# Patient Record
Sex: Female | Born: 1950
Health system: Southern US, Community
[De-identification: ages and names within clinical notes are randomized; demographics above are authoritative.]

## PROBLEM LIST (undated history)

## (undated) DIAGNOSIS — T7840XA Allergy, unspecified, initial encounter: Secondary | ICD-10-CM

## (undated) DIAGNOSIS — F419 Anxiety disorder, unspecified: Secondary | ICD-10-CM

## (undated) DIAGNOSIS — I10 Essential (primary) hypertension: Secondary | ICD-10-CM

## (undated) DIAGNOSIS — H539 Unspecified visual disturbance: Secondary | ICD-10-CM

## (undated) DIAGNOSIS — E785 Hyperlipidemia, unspecified: Secondary | ICD-10-CM

## (undated) DIAGNOSIS — M1990 Unspecified osteoarthritis, unspecified site: Secondary | ICD-10-CM

## (undated) HISTORY — DX: Essential (primary) hypertension: I10

## (undated) HISTORY — DX: Hyperlipidemia, unspecified: E78.5

## (undated) HISTORY — DX: Anxiety disorder, unspecified: F41.9

## (undated) HISTORY — PX: OTHER SURGICAL HISTORY: SHX169

## (undated) HISTORY — DX: Unspecified osteoarthritis, unspecified site: M19.90

## (undated) HISTORY — DX: Allergy, unspecified, initial encounter: T78.40XA

## (undated) HISTORY — PX: TOTAL ABDOMINAL HYSTERECTOMY: SHX209

---

## 2002-01-28 ENCOUNTER — Encounter: Payer: Self-pay | Admitting: Family Medicine

## 2002-01-28 ENCOUNTER — Ambulatory Visit (HOSPITAL_COMMUNITY): Admission: RE | Admit: 2002-01-28 | Discharge: 2002-01-28 | Payer: Self-pay | Admitting: Family Medicine

## 2002-02-19 ENCOUNTER — Other Ambulatory Visit: Admission: RE | Admit: 2002-02-19 | Discharge: 2002-02-19 | Payer: Self-pay | Admitting: Family Medicine

## 2003-04-12 ENCOUNTER — Encounter: Payer: Self-pay | Admitting: Family Medicine

## 2003-04-12 ENCOUNTER — Ambulatory Visit (HOSPITAL_COMMUNITY): Admission: RE | Admit: 2003-04-12 | Discharge: 2003-04-12 | Payer: Self-pay | Admitting: Family Medicine

## 2003-10-26 ENCOUNTER — Emergency Department (HOSPITAL_COMMUNITY): Admission: EM | Admit: 2003-10-26 | Discharge: 2003-10-26 | Payer: Self-pay | Admitting: Emergency Medicine

## 2004-05-01 ENCOUNTER — Ambulatory Visit (HOSPITAL_COMMUNITY): Admission: RE | Admit: 2004-05-01 | Discharge: 2004-05-01 | Payer: Self-pay | Admitting: Family Medicine

## 2004-11-19 ENCOUNTER — Ambulatory Visit: Payer: Self-pay | Admitting: Family Medicine

## 2004-11-23 ENCOUNTER — Ambulatory Visit (HOSPITAL_COMMUNITY): Admission: RE | Admit: 2004-11-23 | Discharge: 2004-11-23 | Payer: Self-pay | Admitting: Family Medicine

## 2005-02-18 ENCOUNTER — Ambulatory Visit: Payer: Self-pay | Admitting: Family Medicine

## 2005-04-01 ENCOUNTER — Ambulatory Visit: Payer: Self-pay | Admitting: Family Medicine

## 2005-05-02 ENCOUNTER — Ambulatory Visit (HOSPITAL_COMMUNITY): Admission: RE | Admit: 2005-05-02 | Discharge: 2005-05-02 | Payer: Self-pay | Admitting: Family Medicine

## 2005-05-13 ENCOUNTER — Ambulatory Visit: Payer: Self-pay | Admitting: Family Medicine

## 2005-07-24 ENCOUNTER — Ambulatory Visit: Payer: Self-pay | Admitting: Family Medicine

## 2005-09-23 ENCOUNTER — Ambulatory Visit: Payer: Self-pay | Admitting: Family Medicine

## 2006-01-24 ENCOUNTER — Ambulatory Visit: Payer: Self-pay | Admitting: Family Medicine

## 2006-05-13 ENCOUNTER — Ambulatory Visit (HOSPITAL_COMMUNITY): Admission: RE | Admit: 2006-05-13 | Discharge: 2006-05-13 | Payer: Self-pay | Admitting: Family Medicine

## 2006-07-15 ENCOUNTER — Ambulatory Visit: Payer: Self-pay | Admitting: Family Medicine

## 2006-10-16 ENCOUNTER — Ambulatory Visit: Payer: Self-pay | Admitting: Family Medicine

## 2006-11-13 ENCOUNTER — Ambulatory Visit: Payer: Self-pay | Admitting: Family Medicine

## 2007-01-02 ENCOUNTER — Encounter: Payer: Self-pay | Admitting: Family Medicine

## 2007-01-02 LAB — CONVERTED CEMR LAB
BUN: 17 mg/dL (ref 6–23)
Basophils Absolute: 0.1 10*3/uL (ref 0.0–0.1)
Basophils Relative: 2 % — ABNORMAL HIGH (ref 0–1)
CO2: 25 meq/L (ref 19–32)
Calcium: 9.5 mg/dL (ref 8.4–10.5)
Chloride: 107 meq/L (ref 96–112)
Cholesterol: 133 mg/dL (ref 0–200)
Creatinine, Ser: 0.6 mg/dL (ref 0.40–1.20)
Eosinophils Absolute: 0.1 10*3/uL (ref 0.0–0.7)
Eosinophils Relative: 3 % (ref 0–5)
Glucose, Bld: 78 mg/dL (ref 70–99)
HCT: 38.6 % (ref 36.0–46.0)
HDL: 55 mg/dL (ref >39)
Hemoglobin: 11.9 g/dL — ABNORMAL LOW (ref 12.0–15.0)
LDL Cholesterol: 62 mg/dL (ref 0–99)
Lymphocytes Relative: 40 % (ref 12–46)
Lymphs Abs: 1.8 10*3/uL (ref 0.7–3.3)
MCHC: 30.8 g/dL (ref 30.0–36.0)
MCV: 100.8 fL — ABNORMAL HIGH (ref 78.0–100.0)
Monocytes Absolute: 0.4 10*3/uL (ref 0.2–0.7)
Monocytes Relative: 8 % (ref 3–11)
Neutro Abs: 2.1 10*3/uL (ref 1.7–7.7)
Neutrophils Relative %: 47 % (ref 43–77)
Platelets: 222 10*3/uL (ref 150–400)
Potassium: 4.3 meq/L (ref 3.5–5.3)
RBC: 3.83 M/uL — ABNORMAL LOW (ref 3.87–5.11)
RDW: 14.2 % — ABNORMAL HIGH (ref 11.5–14.0)
Sodium: 143 meq/L (ref 135–145)
TSH: 2.674 microintl units/mL (ref 0.350–5.50)
Total CHOL/HDL Ratio: 2.4
Triglycerides: 80 mg/dL (ref <150)
VLDL: 16 mg/dL (ref 0–40)
WBC: 4.4 10*3/uL (ref 4.0–10.5)

## 2007-01-08 ENCOUNTER — Other Ambulatory Visit: Admission: RE | Admit: 2007-01-08 | Discharge: 2007-01-08 | Payer: Self-pay | Admitting: Family Medicine

## 2007-01-08 ENCOUNTER — Encounter: Payer: Self-pay | Admitting: Family Medicine

## 2007-01-08 ENCOUNTER — Ambulatory Visit: Payer: Self-pay | Admitting: Family Medicine

## 2007-01-12 ENCOUNTER — Encounter: Payer: Self-pay | Admitting: Family Medicine

## 2007-01-12 LAB — CONVERTED CEMR LAB: Pap Smear: NORMAL

## 2007-02-20 ENCOUNTER — Encounter: Admission: RE | Admit: 2007-02-20 | Discharge: 2007-02-20 | Payer: Self-pay | Admitting: Neurosurgery

## 2007-03-06 ENCOUNTER — Encounter: Admission: RE | Admit: 2007-03-06 | Discharge: 2007-03-06 | Payer: Self-pay | Admitting: Neurosurgery

## 2007-05-18 ENCOUNTER — Ambulatory Visit (HOSPITAL_COMMUNITY): Admission: RE | Admit: 2007-05-18 | Discharge: 2007-05-18 | Payer: Self-pay | Admitting: Family Medicine

## 2007-05-21 ENCOUNTER — Ambulatory Visit: Payer: Self-pay | Admitting: Family Medicine

## 2007-05-28 ENCOUNTER — Ambulatory Visit (HOSPITAL_COMMUNITY): Admission: RE | Admit: 2007-05-28 | Discharge: 2007-05-28 | Payer: Self-pay | Admitting: Family Medicine

## 2007-07-07 ENCOUNTER — Encounter: Payer: Self-pay | Admitting: Family Medicine

## 2007-07-07 LAB — CONVERTED CEMR LAB
ALT: 32 units/L (ref 0–35)
AST: 25 units/L (ref 0–37)
Albumin: 3.9 g/dL (ref 3.5–5.2)
Alkaline Phosphatase: 58 units/L (ref 39–117)
BUN: 15 mg/dL (ref 6–23)
Bilirubin, Direct: 0.1 mg/dL (ref 0.0–0.3)
CO2: 22 meq/L (ref 19–32)
Calcium: 9.6 mg/dL (ref 8.4–10.5)
Chloride: 109 meq/L (ref 96–112)
Cholesterol: 135 mg/dL (ref 0–200)
Creatinine, Ser: 0.68 mg/dL (ref 0.40–1.20)
Glucose, Bld: 87 mg/dL (ref 70–99)
HDL: 52 mg/dL (ref >39)
Indirect Bilirubin: 0.3 mg/dL (ref 0.0–0.9)
LDL Cholesterol: 68 mg/dL (ref 0–99)
Potassium: 4.6 meq/L (ref 3.5–5.3)
Sodium: 144 meq/L (ref 135–145)
Total Bilirubin: 0.4 mg/dL (ref 0.3–1.2)
Total CHOL/HDL Ratio: 2.6
Total Protein: 7.1 g/dL (ref 6.0–8.3)
Triglycerides: 77 mg/dL (ref <150)
VLDL: 15 mg/dL (ref 0–40)

## 2007-09-23 ENCOUNTER — Ambulatory Visit: Payer: Self-pay | Admitting: Family Medicine

## 2007-12-11 ENCOUNTER — Ambulatory Visit: Payer: Self-pay | Admitting: Family Medicine

## 2007-12-11 LAB — CONVERTED CEMR LAB
ALT: 46 units/L — ABNORMAL HIGH (ref 0–35)
AST: 27 units/L (ref 0–37)
Albumin: 4.5 g/dL (ref 3.5–5.2)
Alkaline Phosphatase: 76 units/L (ref 39–117)
BUN: 16 mg/dL (ref 6–23)
Bilirubin, Direct: 0.1 mg/dL (ref 0.0–0.3)
CO2: 22 meq/L (ref 19–32)
Calcium: 10 mg/dL (ref 8.4–10.5)
Chloride: 108 meq/L (ref 96–112)
Cholesterol: 191 mg/dL (ref 0–200)
Creatinine, Ser: 0.58 mg/dL (ref 0.40–1.20)
Glucose, Bld: 86 mg/dL (ref 70–99)
HDL: 76 mg/dL (ref >39)
Indirect Bilirubin: 0.3 mg/dL (ref 0.0–0.9)
LDL Cholesterol: 93 mg/dL (ref 0–99)
Potassium: 4 meq/L (ref 3.5–5.3)
Sodium: 145 meq/L (ref 135–145)
Total Bilirubin: 0.4 mg/dL (ref 0.3–1.2)
Total CHOL/HDL Ratio: 2.5
Total Protein: 8.4 g/dL — ABNORMAL HIGH (ref 6.0–8.3)
Triglycerides: 109 mg/dL (ref <150)
VLDL: 22 mg/dL (ref 0–40)

## 2007-12-24 ENCOUNTER — Ambulatory Visit: Payer: Self-pay | Admitting: Family Medicine

## 2007-12-24 ENCOUNTER — Ambulatory Visit (HOSPITAL_COMMUNITY): Admission: RE | Admit: 2007-12-24 | Discharge: 2007-12-24 | Payer: Self-pay | Admitting: Family Medicine

## 2007-12-25 ENCOUNTER — Ambulatory Visit (HOSPITAL_COMMUNITY): Admission: RE | Admit: 2007-12-25 | Discharge: 2007-12-25 | Payer: Self-pay | Admitting: Family Medicine

## 2008-01-28 ENCOUNTER — Encounter: Payer: Self-pay | Admitting: Family Medicine

## 2008-01-28 LAB — CONVERTED CEMR LAB
ALT: 39 units/L — ABNORMAL HIGH (ref 0–35)
AST: 26 units/L (ref 0–37)
Albumin: 4.1 g/dL (ref 3.5–5.2)
Alkaline Phosphatase: 67 units/L (ref 39–117)
Bilirubin, Direct: <0.1 mg/dL (ref 0.0–0.3)
Total Bilirubin: 0.4 mg/dL (ref 0.3–1.2)
Total Protein: 7.4 g/dL (ref 6.0–8.3)

## 2008-02-03 ENCOUNTER — Ambulatory Visit: Payer: Self-pay | Admitting: Family Medicine

## 2008-02-03 ENCOUNTER — Other Ambulatory Visit: Admission: RE | Admit: 2008-02-03 | Discharge: 2008-02-03 | Payer: Self-pay | Admitting: Family Medicine

## 2008-02-03 LAB — CONVERTED CEMR LAB: Pap Smear: NORMAL

## 2008-02-18 ENCOUNTER — Encounter: Payer: Self-pay | Admitting: Family Medicine

## 2008-02-18 DIAGNOSIS — E669 Obesity, unspecified: Secondary | ICD-10-CM | POA: Insufficient documentation

## 2008-02-18 DIAGNOSIS — M069 Rheumatoid arthritis, unspecified: Secondary | ICD-10-CM | POA: Insufficient documentation

## 2008-02-18 DIAGNOSIS — E663 Overweight: Secondary | ICD-10-CM | POA: Insufficient documentation

## 2008-02-18 DIAGNOSIS — E782 Mixed hyperlipidemia: Secondary | ICD-10-CM | POA: Insufficient documentation

## 2008-02-18 DIAGNOSIS — I10 Essential (primary) hypertension: Secondary | ICD-10-CM | POA: Insufficient documentation

## 2008-02-18 DIAGNOSIS — R42 Dizziness and giddiness: Secondary | ICD-10-CM | POA: Insufficient documentation

## 2008-02-18 DIAGNOSIS — E785 Hyperlipidemia, unspecified: Secondary | ICD-10-CM | POA: Insufficient documentation

## 2008-05-03 ENCOUNTER — Encounter: Payer: Self-pay | Admitting: Family Medicine

## 2008-05-03 LAB — CONVERTED CEMR LAB
ALT: 32 units/L (ref 0–35)
AST: 25 units/L (ref 0–37)
Albumin: 4 g/dL (ref 3.5–5.2)
Alkaline Phosphatase: 65 units/L (ref 39–117)
Bilirubin, Direct: 0.1 mg/dL (ref 0.0–0.3)
Cholesterol: 162 mg/dL (ref 0–200)
HDL: 49 mg/dL (ref >39)
Indirect Bilirubin: 0.2 mg/dL (ref 0.0–0.9)
LDL Cholesterol: 98 mg/dL (ref 0–99)
Total Bilirubin: 0.3 mg/dL (ref 0.3–1.2)
Total CHOL/HDL Ratio: 3.3
Total Protein: 7.7 g/dL (ref 6.0–8.3)
Triglycerides: 76 mg/dL (ref <150)
VLDL: 15 mg/dL (ref 0–40)

## 2008-05-12 ENCOUNTER — Ambulatory Visit: Payer: Self-pay | Admitting: Family Medicine

## 2008-05-20 ENCOUNTER — Ambulatory Visit (HOSPITAL_COMMUNITY): Admission: RE | Admit: 2008-05-20 | Discharge: 2008-05-20 | Payer: Self-pay | Admitting: Family Medicine

## 2008-07-12 ENCOUNTER — Encounter: Payer: Self-pay | Admitting: Family Medicine

## 2008-07-12 ENCOUNTER — Ambulatory Visit: Payer: Self-pay | Admitting: Family Medicine

## 2008-07-13 DIAGNOSIS — M549 Dorsalgia, unspecified: Secondary | ICD-10-CM | POA: Insufficient documentation

## 2008-07-13 DIAGNOSIS — H906 Mixed conductive and sensorineural hearing loss, bilateral: Secondary | ICD-10-CM | POA: Insufficient documentation

## 2008-07-18 ENCOUNTER — Encounter: Payer: Self-pay | Admitting: Family Medicine

## 2008-08-09 ENCOUNTER — Encounter: Payer: Self-pay | Admitting: Family Medicine

## 2008-08-30 ENCOUNTER — Encounter: Payer: Self-pay | Admitting: Family Medicine

## 2008-09-12 ENCOUNTER — Ambulatory Visit: Payer: Self-pay | Admitting: Family Medicine

## 2008-09-12 DIAGNOSIS — M25559 Pain in unspecified hip: Secondary | ICD-10-CM | POA: Insufficient documentation

## 2008-09-21 ENCOUNTER — Ambulatory Visit (HOSPITAL_COMMUNITY): Admission: RE | Admit: 2008-09-21 | Discharge: 2008-09-21 | Payer: Self-pay | Admitting: Family Medicine

## 2008-10-19 ENCOUNTER — Encounter: Payer: Self-pay | Admitting: Family Medicine

## 2008-11-01 ENCOUNTER — Encounter: Payer: Self-pay | Admitting: Family Medicine

## 2008-11-07 ENCOUNTER — Telehealth: Payer: Self-pay | Admitting: Family Medicine

## 2008-11-15 ENCOUNTER — Ambulatory Visit: Payer: Self-pay | Admitting: Family Medicine

## 2008-11-25 ENCOUNTER — Encounter: Payer: Self-pay | Admitting: Family Medicine

## 2008-12-05 ENCOUNTER — Encounter: Payer: Self-pay | Admitting: Family Medicine

## 2008-12-28 ENCOUNTER — Encounter: Payer: Self-pay | Admitting: Family Medicine

## 2008-12-28 LAB — CONVERTED CEMR LAB
ALT: 47 units/L — ABNORMAL HIGH (ref 0–35)
AST: 30 units/L (ref 0–37)
Albumin: 4.1 g/dL (ref 3.5–5.2)
Alkaline Phosphatase: 64 units/L (ref 39–117)
BUN: 23 mg/dL (ref 6–23)
Basophils Absolute: 0 10*3/uL (ref 0.0–0.1)
Basophils Relative: 1 % (ref 0–1)
Bilirubin, Direct: 0.1 mg/dL (ref 0.0–0.3)
CO2: 24 meq/L (ref 19–32)
Calcium: 9.5 mg/dL (ref 8.4–10.5)
Chloride: 105 meq/L (ref 96–112)
Cholesterol: 162 mg/dL (ref 0–200)
Creatinine, Ser: 0.7 mg/dL (ref 0.40–1.20)
Eosinophils Absolute: 0.2 10*3/uL (ref 0.0–0.7)
Eosinophils Relative: 3 % (ref 0–5)
Glucose, Bld: 91 mg/dL (ref 70–99)
HCT: 38.5 % (ref 36.0–46.0)
HDL: 56 mg/dL (ref >39)
Hemoglobin: 12.3 g/dL (ref 12.0–15.0)
Indirect Bilirubin: 0.4 mg/dL (ref 0.0–0.9)
LDL Cholesterol: 85 mg/dL (ref 0–99)
Lymphocytes Relative: 33 % (ref 12–46)
Lymphs Abs: 1.9 10*3/uL (ref 0.7–4.0)
MCHC: 31.9 g/dL (ref 30.0–36.0)
MCV: 99.2 fL (ref 78.0–100.0)
Monocytes Absolute: 0.5 10*3/uL (ref 0.1–1.0)
Monocytes Relative: 8 % (ref 3–12)
Neutro Abs: 3.2 10*3/uL (ref 1.7–7.7)
Neutrophils Relative %: 55 % (ref 43–77)
Platelets: 296 10*3/uL (ref 150–400)
Potassium: 4.1 meq/L (ref 3.5–5.3)
RBC: 3.88 M/uL (ref 3.87–5.11)
RDW: 13.7 % (ref 11.5–15.5)
Sodium: 142 meq/L (ref 135–145)
Total Bilirubin: 0.5 mg/dL (ref 0.3–1.2)
Total CHOL/HDL Ratio: 2.9
Total Protein: 7.8 g/dL (ref 6.0–8.3)
Triglycerides: 104 mg/dL (ref <150)
VLDL: 21 mg/dL (ref 0–40)
WBC: 5.8 10*3/uL (ref 4.0–10.5)

## 2009-01-06 ENCOUNTER — Encounter: Payer: Self-pay | Admitting: Family Medicine

## 2009-01-10 ENCOUNTER — Encounter: Payer: Self-pay | Admitting: Family Medicine

## 2009-01-11 ENCOUNTER — Encounter: Payer: Self-pay | Admitting: Family Medicine

## 2009-02-02 ENCOUNTER — Encounter: Payer: Self-pay | Admitting: Family Medicine

## 2009-02-10 ENCOUNTER — Encounter: Payer: Self-pay | Admitting: Family Medicine

## 2009-02-13 ENCOUNTER — Ambulatory Visit: Payer: Self-pay | Admitting: Family Medicine

## 2009-02-13 LAB — CONVERTED CEMR LAB: OCCULT 1: NEGATIVE

## 2009-04-10 ENCOUNTER — Encounter: Payer: Self-pay | Admitting: Family Medicine

## 2009-05-10 ENCOUNTER — Encounter: Payer: Self-pay | Admitting: Family Medicine

## 2009-05-16 ENCOUNTER — Ambulatory Visit: Payer: Self-pay | Admitting: Family Medicine

## 2009-05-16 LAB — CONVERTED CEMR LAB
Blood in Urine, dipstick: NEGATIVE
Glucose, Urine, Semiquant: NEGATIVE
Nitrite: NEGATIVE
Protein, U semiquant: NEGATIVE
Specific Gravity, Urine: 1.025
Urobilinogen, UA: 0.2
WBC Urine, dipstick: NEGATIVE
pH: 5

## 2009-05-19 ENCOUNTER — Encounter: Payer: Self-pay | Admitting: Family Medicine

## 2009-05-22 ENCOUNTER — Ambulatory Visit (HOSPITAL_COMMUNITY): Admission: RE | Admit: 2009-05-22 | Discharge: 2009-05-22 | Payer: Self-pay | Admitting: Family Medicine

## 2009-05-22 ENCOUNTER — Encounter: Payer: Self-pay | Admitting: Family Medicine

## 2009-07-11 ENCOUNTER — Encounter: Payer: Self-pay | Admitting: Family Medicine

## 2009-07-25 LAB — CONVERTED CEMR LAB
ALT: 40 units/L — ABNORMAL HIGH (ref 0–35)
AST: 30 units/L (ref 0–37)
Albumin: 4 g/dL (ref 3.5–5.2)
Alkaline Phosphatase: 55 units/L (ref 39–117)
BUN: 15 mg/dL (ref 6–23)
Bilirubin, Direct: 0.1 mg/dL (ref 0.0–0.3)
CO2: 21 meq/L (ref 19–32)
Calcium: 9.5 mg/dL (ref 8.4–10.5)
Chloride: 105 meq/L (ref 96–112)
Cholesterol: 145 mg/dL (ref 0–200)
Creatinine, Ser: 0.75 mg/dL (ref 0.40–1.20)
Glucose, Bld: 99 mg/dL (ref 70–99)
HDL: 55 mg/dL (ref >39)
Indirect Bilirubin: 0.2 mg/dL (ref 0.0–0.9)
LDL Cholesterol: 74 mg/dL (ref 0–99)
Potassium: 4.3 meq/L (ref 3.5–5.3)
Sodium: 142 meq/L (ref 135–145)
TSH: 3.907 microintl units/mL (ref 0.350–4.500)
Total Bilirubin: 0.3 mg/dL (ref 0.3–1.2)
Total CHOL/HDL Ratio: 2.6
Total Protein: 7.3 g/dL (ref 6.0–8.3)
Triglycerides: 78 mg/dL (ref <150)
VLDL: 16 mg/dL (ref 0–40)

## 2009-08-08 ENCOUNTER — Ambulatory Visit: Payer: Self-pay | Admitting: Family Medicine

## 2009-09-15 ENCOUNTER — Ambulatory Visit: Payer: Self-pay | Admitting: Family Medicine

## 2009-11-22 ENCOUNTER — Ambulatory Visit: Payer: Self-pay | Admitting: Family Medicine

## 2009-12-29 LAB — CONVERTED CEMR LAB
ALT: 41 units/L — ABNORMAL HIGH (ref 0–35)
AST: 35 units/L (ref 0–37)
Albumin: 4 g/dL (ref 3.5–5.2)
Alkaline Phosphatase: 63 units/L (ref 39–117)
BUN: 12 mg/dL (ref 6–23)
Basophils Absolute: 0 10*3/uL (ref 0.0–0.1)
Basophils Relative: 0 % (ref 0–1)
Bilirubin, Direct: 0.2 mg/dL (ref 0.0–0.3)
CO2: 25 meq/L (ref 19–32)
Calcium: 9.2 mg/dL (ref 8.4–10.5)
Chloride: 103 meq/L (ref 96–112)
Cholesterol: 143 mg/dL (ref 0–200)
Creatinine, Ser: 0.74 mg/dL (ref 0.40–1.20)
Eosinophils Absolute: 0.1 10*3/uL (ref 0.0–0.7)
Eosinophils Relative: 3 % (ref 0–5)
Glucose, Bld: 78 mg/dL (ref 70–99)
HCT: 37.6 % (ref 36.0–46.0)
HDL: 46 mg/dL (ref >39)
Hemoglobin: 11.9 g/dL — ABNORMAL LOW (ref 12.0–15.0)
Indirect Bilirubin: 0.2 mg/dL (ref 0.0–0.9)
LDL Cholesterol: 81 mg/dL (ref 0–99)
Lymphocytes Relative: 33 % (ref 12–46)
Lymphs Abs: 1.7 10*3/uL (ref 0.7–4.0)
MCHC: 31.6 g/dL (ref 30.0–36.0)
MCV: 99.5 fL (ref 78.0–100.0)
Monocytes Absolute: 0.4 10*3/uL (ref 0.1–1.0)
Monocytes Relative: 8 % (ref 3–12)
Neutro Abs: 2.8 10*3/uL (ref 1.7–7.7)
Neutrophils Relative %: 56 % (ref 43–77)
Platelets: 222 10*3/uL (ref 150–400)
Potassium: 3.7 meq/L (ref 3.5–5.3)
RBC: 3.78 M/uL — ABNORMAL LOW (ref 3.87–5.11)
RDW: 13.6 % (ref 11.5–15.5)
Sodium: 145 meq/L (ref 135–145)
Total Bilirubin: 0.4 mg/dL (ref 0.3–1.2)
Total CHOL/HDL Ratio: 3.1
Total Protein: 7.4 g/dL (ref 6.0–8.3)
Triglycerides: 82 mg/dL (ref <150)
VLDL: 16 mg/dL (ref 0–40)
WBC: 5 10*3/uL (ref 4.0–10.5)

## 2010-01-02 ENCOUNTER — Encounter: Payer: Self-pay | Admitting: Family Medicine

## 2010-03-08 ENCOUNTER — Telehealth: Payer: Self-pay | Admitting: Family Medicine

## 2010-03-19 ENCOUNTER — Telehealth: Payer: Self-pay | Admitting: Family Medicine

## 2010-03-21 ENCOUNTER — Other Ambulatory Visit: Admission: RE | Admit: 2010-03-21 | Discharge: 2010-03-21 | Payer: Self-pay | Admitting: Family Medicine

## 2010-03-21 ENCOUNTER — Ambulatory Visit: Payer: Self-pay | Admitting: Family Medicine

## 2010-03-21 LAB — CONVERTED CEMR LAB
OCCULT 1: NEGATIVE
Pap Smear: NEGATIVE

## 2010-05-31 ENCOUNTER — Ambulatory Visit (HOSPITAL_COMMUNITY): Admission: RE | Admit: 2010-05-31 | Discharge: 2010-05-31 | Payer: Self-pay | Admitting: Family Medicine

## 2010-06-20 LAB — CONVERTED CEMR LAB
ALT: 34 units/L (ref 0–35)
AST: 29 units/L (ref 0–37)
Albumin: 4 g/dL (ref 3.5–5.2)
Alkaline Phosphatase: 58 units/L (ref 39–117)
BUN: 19 mg/dL (ref 6–23)
Bilirubin, Direct: 0.1 mg/dL (ref 0.0–0.3)
CO2: 26 meq/L (ref 19–32)
Calcium: 9.6 mg/dL (ref 8.4–10.5)
Chloride: 109 meq/L (ref 96–112)
Cholesterol: 176 mg/dL (ref 0–200)
Creatinine, Ser: 0.73 mg/dL (ref 0.40–1.20)
Glucose, Bld: 90 mg/dL (ref 70–99)
HDL: 59 mg/dL (ref >39)
Indirect Bilirubin: 0.2 mg/dL (ref 0.0–0.9)
LDL Cholesterol: 96 mg/dL (ref 0–99)
Potassium: 4.1 meq/L (ref 3.5–5.3)
Sodium: 141 meq/L (ref 135–145)
Total Bilirubin: 0.3 mg/dL (ref 0.3–1.2)
Total CHOL/HDL Ratio: 3
Total Protein: 7.4 g/dL (ref 6.0–8.3)
Triglycerides: 105 mg/dL (ref <150)
VLDL: 21 mg/dL (ref 0–40)
Vit D, 25-Hydroxy: 32 ng/mL (ref 30–89)

## 2010-08-21 ENCOUNTER — Ambulatory Visit: Payer: Self-pay | Admitting: Family Medicine

## 2010-08-26 ENCOUNTER — Encounter: Payer: Self-pay | Admitting: Family Medicine

## 2010-08-26 DIAGNOSIS — J019 Acute sinusitis, unspecified: Secondary | ICD-10-CM | POA: Insufficient documentation

## 2010-08-26 DIAGNOSIS — J309 Allergic rhinitis, unspecified: Secondary | ICD-10-CM | POA: Insufficient documentation

## 2010-09-26 ENCOUNTER — Telehealth: Payer: Self-pay | Admitting: Family Medicine

## 2010-09-26 ENCOUNTER — Ambulatory Visit: Payer: Self-pay | Admitting: Family Medicine

## 2010-11-17 ENCOUNTER — Telehealth: Payer: Self-pay | Admitting: Family Medicine

## 2010-12-17 LAB — CONVERTED CEMR LAB
ALT: 36 units/L — ABNORMAL HIGH (ref 0–35)
AST: 27 units/L (ref 0–37)
Albumin: 4.1 g/dL (ref 3.5–5.2)
Alkaline Phosphatase: 60 units/L (ref 39–117)
BUN: 24 mg/dL — ABNORMAL HIGH (ref 6–23)
Bilirubin, Direct: <0.1 mg/dL (ref 0.0–0.3)
CO2: 28 meq/L (ref 19–32)
Calcium: 9.3 mg/dL (ref 8.4–10.5)
Chloride: 107 meq/L (ref 96–112)
Cholesterol: 204 mg/dL — ABNORMAL HIGH (ref 0–200)
Creatinine, Ser: 0.73 mg/dL (ref 0.40–1.20)
Glucose, Bld: 102 mg/dL — ABNORMAL HIGH (ref 70–99)
HDL: 63 mg/dL (ref >39)
LDL Cholesterol: 121 mg/dL — ABNORMAL HIGH (ref 0–99)
Potassium: 4.4 meq/L (ref 3.5–5.3)
Sodium: 143 meq/L (ref 135–145)
Total Bilirubin: 0.3 mg/dL (ref 0.3–1.2)
Total CHOL/HDL Ratio: 3.2
Total Protein: 7.4 g/dL (ref 6.0–8.3)
Triglycerides: 102 mg/dL (ref <150)
VLDL: 20 mg/dL (ref 0–40)

## 2010-12-24 ENCOUNTER — Ambulatory Visit
Admission: RE | Admit: 2010-12-24 | Discharge: 2010-12-24 | Payer: Self-pay | Source: Home / Self Care | Attending: Family Medicine | Admitting: Family Medicine

## 2010-12-30 ENCOUNTER — Encounter: Payer: Self-pay | Admitting: Family Medicine

## 2011-01-08 NOTE — Assessment & Plan Note (Signed)
Summary: OV   Vital Signs:  Patient profile:   60 year old female Menstrual status:  hysterectomy Height:      66 inches Weight:      220.75 pounds BMI:     35.76 O2 Sat:      94 % Pulse rate:   63 / minute Pulse rhythm:   regular Resp:     16 per minute BP sitting:   112 / 84 Cuff size:   xl  Vitals Entered By: Everitt Amber (November 22, 2009 10:49 AM) CC: Having swelling in her left knee. Also has a muscle spasm type pain in her right hip that goes down her leg Is Patient Diabetic? No   CC:  Having swelling in her left knee. Also has a muscle spasm type pain in her right hip that goes down her leg.  History of Present Illness: Reports  thatshe has been doing well. Denies recent fever or chills. Denies sinus pressure, nasal congestion , ear pain or sore throat. Denies chest congestion, or cough productive of sputum. Denies chest pain, palpitations, PND, orthopnea or leg swelling. Denies abdominal pain, nausea, vomitting, diarrhea or constipation. Denies change in bowel movements or bloody stool. Denies dysuria , frequency, incontinence or hesitancy.  Denies headaches, vertigo, seizures. Denies depression, anxiety or insomnia. Denies  rash, lesions, or itch. Rewports that hse has been walking regularly trying to lose weight and is disappointed that this has not yet happened.     Current Medications (verified): 1)  Cetirizine Hcl 10 Mg  Tabs (Cetirizine Hcl) .... Take 1 Tablet By Mouth Once A Day 2)  Oscal 500/200 D-3 500-200 Mg-Unit  Tabs (Calcium-Vitamin D) .... Take 1 Tablet By Mouth Three Times A Day 3)  Lipitor 40 Mg  Tabs (Atorvastatin Calcium) .... Take 1 Tab By Mouth At Bedtime 4)  Adult Aspirin Low Strength 81 Mg  Tbdp (Aspirin) .... Take 1 Tablet By Mouth Once A Day 5)  Patanol 0.1 %  Soln (Olopatadine Hcl) .... One Drop Each Eye Twice Daily 6)  Symbicort 80-4.5 Mcg/act  Aero (Budesonide-Formoterol Fumarate) .... Inhale Two Puffs Twice Daily 7)  Proventil Hfa  108 (90 Base) Mcg/act Aers (Albuterol Sulfate) .... Take 2 Puffs Every 6 Hours As Needed 8)  Singulair 10 Mg  Tabs (Montelukast Sodium) .... One Tab By Mouth Once Daily 9)  Arthrotec 75 75-200 Mg-Mcg Tabs (Diclofenac-Misoprostol) .... Take 1 Tablet By Mouth Two Times A Day 10)  Lotensin Hct 20-12.5 Mg Tabs (Benazepril-Hydrochlorothiazide) .... Two Tabs By Mouth Qd 11)  Norvasc 5 Mg Tabs (Amlodipine Besylate) .... Take 1 Tab By Mouth At Bedtime  Allergies (verified): No Known Drug Allergies  Review of Systems      See HPI MS:  Complains of joint pain, low back pain, muscle aches, and stiffness; right hip pain and stiffness, and low back pain radiating down leg worsein the past 2 weks. Derm:  Denies itching and rash. Neuro:  Complains of headaches; denies tingling and tremors. Endo:  Denies cold intolerance, excessive hunger, excessive thirst, excessive urination, heat intolerance, polyuria, and weight change. Heme:  Denies abnormal bruising and bleeding. Allergy:  Complains of seasonal allergies and sneezing; denies hives or rash and itching eyes; increased and uncontrolled allegy symptoms in the past 3 to 4 weeks.  Physical Exam  General:  alert, well-hydrated, and overweight-appearing.  HEENT: No facial asymmetry,  EOMI, No sinus tenderness, TM's Clear, oropharynx  pink and moist.   Chest: Clear to auscultation bilaterally.  CVS:  S1, S2, No murmurs, No S3.   Abd: Soft, Nontender.  MS: decresed ROM spine,and hips,adequate in shoulders and knees.  Ext: No edema.   CNS: poor vision and hearing loss, power tone and sensation normal throughout.   Skin: Intact, no visible lesions or rashes.  Psych: Good eye contact, normal affect.  Memory intact, not anxious or depressed appearing.     Impression & Recommendations:  Problem # 1:  HIP PAIN, RIGHT (ICD-719.45) Assessment Deteriorated  Her updated medication list for this problem includes:    Adult Aspirin Low Strength 81 Mg Tbdp  (Aspirin) .Marland Kitchen... Take 1 tablet by mouth once a day    Arthrotec 75 75-200 Mg-mcg Tabs (Diclofenac-misoprostol) .Marland Kitchen... Take 1 tablet by mouth two times a day toradol administered during ov  Problem # 2:  HYPERTENSION (ICD-401.9) Assessment: Improved  Her updated medication list for this problem includes:    Lotensin Hct 20-12.5 Mg Tabs (Benazepril-hydrochlorothiazide) .Marland Kitchen..Marland Kitchen Two tabs by mouth qd    Norvasc 5 Mg Tabs (Amlodipine besylate) .Marland Kitchen... Take 1 tab by mouth at bedtime  Orders: T-Basic Metabolic Panel [id number])  BP today: 112/84 Prior BP: 122/82 (08/08/2009)  Labs Reviewed: K+: 4.3 (07/25/2009) Creat: : 0.75 (07/25/2009)   Chol: 145 (07/25/2009)   HDL: 55 (07/25/2009)   LDL: 74 (07/25/2009)   TG: 78 (07/25/2009)  Problem # 3:  HYPERLIPIDEMIA (ICD-272.4) Assessment: Comment Only  The following medications were removed from the medication list:    Lipitor 20 Mg Tabs (Atorvastatin calcium) .Marland Kitchen..Marland Kitchen Two tabs by mouth qhs Her updated medication list for this problem includes:    Lipitor 40 Mg Tabs (Atorvastatin calcium) .Marland Kitchen... Take 1 tab by mouth at bedtime  Orders: T-Hepatic Function [id number]) T-Lipid Profile [id number])  Labs Reviewed: SGOT: 30 (07/25/2009)   SGPT: 40 (07/25/2009)   HDL:55 (07/25/2009), 56 (12/28/2008)  LDL:74 (07/25/2009), 85 (12/28/2008)  Chol:145 (07/25/2009), 162 (12/28/2008)  Trig:78 (07/25/2009), 104 (12/28/2008)  Problem # 4:  OBESITY (ICD-278.00) Assessment: Deteriorated  Ht: 66 (11/22/2009)   Wt: 220.75 (11/22/2009)   BMI: 35.76 (11/22/2009)  Problem # 5:  RHEUMATOID ARTHRITIS (ICD-714.0) Assessment: Unchanged  Complete Medication List: 1)  Cetirizine Hcl 10 Mg Tabs (Cetirizine hcl) .... Take 1 tablet by mouth once a day 2)  Oscal 500/200 D-3 500-200 Mg-unit Tabs (Calcium-vitamin d) .... Take 1 tablet by mouth three times a day 3)  Lipitor 40 Mg Tabs (Atorvastatin calcium) .... Take 1 tab by mouth at bedtime 4)  Adult Aspirin Low Strength  81 Mg Tbdp (Aspirin) .... Take 1 tablet by mouth once a day 5)  Patanol 0.1 % Soln (Olopatadine hcl) .... One drop each eye twice daily 6)  Symbicort 80-4.5 Mcg/act Aero (Budesonide-formoterol fumarate) .... Inhale two puffs twice daily 7)  Proventil Hfa 108 (90 Base) Mcg/act Aers (Albuterol sulfate) .... Take 2 puffs every 6 hours as needed 8)  Singulair 10 Mg Tabs (Montelukast sodium) .... One tab by mouth once daily 9)  Arthrotec 75 75-200 Mg-mcg Tabs (Diclofenac-misoprostol) .... Take 1 tablet by mouth two times a day 10)  Lotensin Hct 20-12.5 Mg Tabs (Benazepril-hydrochlorothiazide) .... Two tabs by mouth qd 11)  Norvasc 5 Mg Tabs (Amlodipine besylate) .... Take 1 tab by mouth at bedtime  Other Orders: T-CBC w/Diff ([id number]) Ketorolac-Toradol 15mg  (N0272) Admin of Therapeutic Inj  intramuscular or subcutaneous (53664)  Patient Instructions: 1)  Please schedule a CPE in 3.5 months. 2)  It is important that you exercise regularly at least 20 minutes 5 times a week. If you  develop chest pain, have severe difficulty breathing, or feel very tired , stop exercising immediately and seek medical attention. 3)  You need to lose weight. Consider a lower calorie diet and regular exercise.  4)  BMP prior to visit, ICD-9: 5)  Hepatic Panel prior to visit, ICD-9:   fasting Jan 24 or after. 6)  Lipid Panel prior to visit, ICD-9: 7)  CBC w/ Diff prior to visit, ICD-9: 8)  You will get an injection for your hip today Prescriptions: PROVENTIL HFA 108 (90 BASE) MCG/ACT AERS (ALBUTEROL SULFATE) Take 2 puffs every 6 hours as needed  #1 x 3   Entered by:   Worthy Keeler LPN   Authorized by:   Syliva Overman MD   Signed by:   Worthy Keeler LPN on 16/09/9603   Method used:   Electronically to        [facility]* (retail)       [address] [place], [place]  [address]       Ph: [phone]       Fax: [phone]   RxID:   [id number] NORVASC 5 MG TABS  (AMLODIPINE BESYLATE) Take 1 tab by mouth at bedtime  #30 x 3   Entered by:   Worthy Keeler LPN   Authorized by:   Syliva Overman MD   Signed by:   Worthy Keeler LPN on 10/05/2535   Method used:   Electronically to        [facility]* (retail)       [address]       [place], [place]  [address]       Ph: [phone]       Fax: [phone]   RxID:   [id number] SINGULAIR 10 MG  TABS (MONTELUKAST SODIUM) one tab by mouth once daily  #30 x 3   Entered by:   Worthy Keeler LPN   Authorized by:   Syliva Overman MD   Signed by:   Worthy Keeler LPN on 32/35/5732   Method used:   Electronically to        [facility]* (retail)       [address] [place], [place]  [address]       Ph: [phone]       Fax: [phone]   RxID:   [id number] LIPITOR 40 MG  TABS (ATORVASTATIN CALCIUM) Take 1 tab by mouth at bedtime  #30 x 3   Entered by:   Worthy Keeler LPN   Authorized by:   Syliva Overman MD   Signed by:   Worthy Keeler LPN on 35/00/9381   Method used:   Electronically to        [facility]* (retail)       [address] [place], [place]  [address]       Ph: [phone]       Fax: [phone]   RxID:   [id number] CETIRIZINE HCL 10 MG  TABS (CETIRIZINE HCL) Take 1 tablet by mouth once a day  #30 x 3   Entered by:   Worthy Keeler LPN   Authorized by:   Syliva Overman MD   Signed by:   Worthy Keeler LPN on  11/22/2009   Method used:   Electronically to        [facility]* (retail)       [address] [place], [place]  [address]       Ph: [phone]       Fax: [phone]   RxID:   [id number]    Medication Administration  Injection # 1:    Medication: Ketorolac-Toradol 15mg     Diagnosis: HIP PAIN, RIGHT (ICD-719.45)    Route: IM    Site: RUOQ gluteus    Exp Date: 07/10/2011    Lot #: [id number]     Mfr: [email or web address]    Comments: toradol 60mg  given    Patient tolerated injection without complications    Given by: Worthy Keeler LPN (November 22, 2009 11:37 AM)  Orders Added: 1)  Est. Patient Level IV [01027] 2)  T-Basic Metabolic Panel [[id number]] 3)  T-Hepatic Function [80076-22960] 4)  T-Lipid Profile [80061-22930] 5)  T-CBC w/Diff [[id number]] 6)  Ketorolac-Toradol 15mg  [J1885] 7)  Admin of Therapeutic Inj  intramuscular or subcutaneous [56433]

## 2011-01-08 NOTE — Assessment & Plan Note (Signed)
Summary: OFFICE VISIT   Vital Signs:  Patient profile:   60 year old female Menstrual status:  hysterectomy Height:      66 inches Weight:      216.50 pounds BMI:     35.07 O2 Sat:      97 % on Room air Pulse rate:   66 / minute Pulse rhythm:   regular Resp:     16 per minute BP sitting:   122 / 82  (left arm) Cuff size:   xl  Vitals Entered By: Everitt Amber (August 08, 2009 11:20 AM)  Nutrition Counseling: Patient's BMI is greater than 25 and therefore counseled on weight management options.  O2 Flow:  Room air CC: Follow up chronic problems, hips hurting and she wants a pain shot also. Hurts when she stands up a long time and when she walks Is Patient Diabetic? No   CC:  Follow up chronic problems and hips hurting and she wants a pain shot also. Hurts when she stands up a long time and when she walks.  History of Present Illness: Pt is in for review of recent labs as well as her chronic problems. Seh c/o puritus in the right ear as wellas right hip pain a stiffness. She denies any falls. she denies any recent fevr , chills or symptoms of infection. She has cut back on her intake with resultant weight loss, she does not exercise regularly.  Current Medications (verified): 1)  Cetirizine Hcl 10 Mg  Tabs (Cetirizine Hcl) .... Take 1 Tablet By Mouth Once A Day 2)  Oscal 500/200 D-3 500-200 Mg-Unit  Tabs (Calcium-Vitamin D) .... Take 1 Tablet By Mouth Three Times A Day 3)  Lipitor 40 Mg  Tabs (Atorvastatin Calcium) .... Take 1 Tab By Mouth At Bedtime 4)  Adult Aspirin Low Strength 81 Mg  Tbdp (Aspirin) .... Take 1 Tablet By Mouth Once A Day 5)  Patanol 0.1 %  Soln (Olopatadine Hcl) .... One Drop Each Eye Twice Daily 6)  Symbicort 80-4.5 Mcg/act  Aero (Budesonide-Formoterol Fumarate) .... Inhale Two Puffs Twice Daily 7)  Proventil Hfa 108 (90 Base) Mcg/act Aers (Albuterol Sulfate) .... Take 2 Puffs Every 6 Hours As Needed 8)  Singulair 10 Mg  Tabs (Montelukast Sodium) .... One Tab  By Mouth Once Daily 9)  Arthrotec 75 75-200 Mg-Mcg Tabs (Diclofenac-Misoprostol) .... Take 1 Tablet By Mouth Two Times A Day 10)  Lotensin Hct 20-12.5 Mg Tabs (Benazepril-Hydrochlorothiazide) .... Two Tabs By Mouth Qd 11)  Norvasc 5 Mg Tabs (Amlodipine Besylate) .... Take 1 Tab By Mouth At Bedtime 12)  Lipitor 20 Mg Tabs (Atorvastatin Calcium) .... Two Tabs By Mouth Qhs  Allergies (verified): No Known Drug Allergies  Review of Systems General:  Complains of fatigue; denies chills and fever. Eyes:  Complains of blurring. ENT:  Denies hoarseness, nasal congestion, sinus pressure, and sore throat. CV:  Denies chest pain or discomfort, difficulty breathing while lying down, palpitations, and swelling of feet. Resp:  Denies cough and sputum productive. GI:  Denies abdominal pain, constipation, diarrhea, nausea, and vomiting. GU:  Denies dysuria and urinary frequency. MS:  Complains of joint pain and stiffness; right hip pain and stiffness. Derm:  Denies itching and rash. Neuro:  Complains of visual disturbances; denies headaches, seizures, and sensation of room spinning. Psych:  Denies anxiety and depression. Endo:  Denies cold intolerance, excessive hunger, excessive thirst, excessive urination, heat intolerance, polyuria, and weight change. Heme:  Denies abnormal bruising and bleeding. Allergy:  Denies  hives or rash and persistent infections.  Physical Exam  General:  alert, well-hydrated, and overweight-appearing.  HEENT: No facial asymmetry,  EOMI, No sinus tenderness, TM's Clear, oropharynx  pink and moist.   Chest: Clear to auscultation bilaterally.  CVS: S1, S2, No murmurs, No S3.   Abd: Soft, Nontender.  MS: decresed ROM spine,and hips,adequate in shoulders and knees.  Ext: No edema.   CNS: poor vision and hearing loss, power tone and sensation normal throughout.   Skin: Intact, no visible lesions or rashes.  Psych: Good eye contact, normal affect.  Memory intact, not anxious  or depressed appearing.     Impression & Recommendations:  Problem # 1:  HIP PAIN, RIGHT (ICD-719.45) Assessment Deteriorated  The following medications were removed from the medication list:    Eq Ibuprofen 200 Mg Tabs (Ibuprofen) .Marland Kitchen... Take two tab by mouth once daily as needed Her updated medication list for this problem includes:    Adult Aspirin Low Strength 81 Mg Tbdp (Aspirin) .Marland Kitchen... Take 1 tablet by mouth once a day    Arthrotec 75 75-200 Mg-mcg Tabs (Diclofenac-misoprostol) .Marland Kitchen... Take 1 tablet by mouth two times a day  Orders: Depo- Medrol 80mg  (J1040) Ketorolac-Toradol 15mg  (Z6109) Admin of Therapeutic Inj  intramuscular or subcutaneous ([address]454)  Problem # 2:  OBESITY (ICD-278.00) Assessment: Improved  Ht: 66 (08/08/2009)   Wt: 216.50 (08/08/2009)   BMI: 35.07 (08/08/2009)  Problem # 3:  HYPERLIPIDEMIA (ICD-272.4) Assessment: Improved  Her updated medication list for this problem includes:    Lipitor 40 Mg Tabs (Atorvastatin calcium) .Marland Kitchen... Take 1 tab by mouth at bedtime    Lipitor 20 Mg Tabs (Atorvastatin calcium) .Marland Kitchen..Marland Kitchen Two tabs by mouth qhs  Labs Reviewed: SGOT: 30 (07/25/2009)   SGPT: 40 (07/25/2009)   HDL:55 (07/25/2009), 56 (12/28/2008)  LDL:74 (07/25/2009), 85 (12/28/2008)  Chol:145 (07/25/2009), 162 (12/28/2008)  Trig:78 (07/25/2009), 104 (12/28/2008)  Problem # 4:  HYPERTENSION (ICD-401.9) Assessment: Improved  Her updated medication list for this problem includes:    Lotensin Hct 20-12.5 Mg Tabs (Benazepril-hydrochlorothiazide) .Marland Kitchen..Marland Kitchen Two tabs by mouth qd    Norvasc 5 Mg Tabs (Amlodipine besylate) .Marland Kitchen... Take 1 tab by mouth at bedtime  BP today: 122/82 Prior BP: 150/90 (05/16/2009)  Labs Reviewed: K+: 4.3 (07/25/2009) Creat: : 0.75 (07/25/2009)   Chol: 145 (07/25/2009)   HDL: 55 (07/25/2009)   LDL: 74 (07/25/2009)   TG: 78 (07/25/2009)  Problem # 7:  MIXED HEARING LOSS BILATERAL (ICD-389.22) Assessment: Unchanged  Complete Medication List: 1)   Cetirizine Hcl 10 Mg Tabs (Cetirizine hcl) .... Take 1 tablet by mouth once a day 2)  Oscal 500/200 D-3 500-200 Mg-unit Tabs (Calcium-vitamin d) .... Take 1 tablet by mouth three times a day 3)  Lipitor 40 Mg Tabs (Atorvastatin calcium) .... Take 1 tab by mouth at bedtime 4)  Adult Aspirin Low Strength 81 Mg Tbdp (Aspirin) .... Take 1 tablet by mouth once a day 5)  Patanol 0.1 % Soln (Olopatadine hcl) .... One drop each eye twice daily 6)  Symbicort 80-4.5 Mcg/act Aero (Budesonide-formoterol fumarate) .... Inhale two puffs twice daily 7)  Proventil Hfa 108 (90 Base) Mcg/act Aers (Albuterol sulfate) .... Take 2 puffs every 6 hours as needed 8)  Singulair 10 Mg Tabs (Montelukast sodium) .... One tab by mouth once daily 9)  Arthrotec 75 75-200 Mg-mcg Tabs (Diclofenac-misoprostol) .... Take 1 tablet by mouth two times a day 10)  Lotensin Hct 20-12.5 Mg Tabs (Benazepril-hydrochlorothiazide) .... Two tabs by mouth qd 11)  Norvasc  5 Mg Tabs (Amlodipine besylate) .... Take 1 tab by mouth at bedtime 12)  Lipitor 20 Mg Tabs (Atorvastatin calcium) .... Two tabs by mouth qhs  Patient Instructions: 1)  Please schedule a follow-up appointment in 3 .5months. 2)  It is important that you exercise regularly at least 20 minutes 5 times a week. If you develop chest pain, have severe difficulty breathing, or feel very tired , stop exercising immediately and seek medical attention. 3)  You need to lose weight. Consider a lower calorie diet and regular exercise.  4)  Your labs abnd BP are great, no med changes. 5)  Injections today for your hip Prescriptions: PROVENTIL HFA 108 (90 BASE) MCG/ACT AERS (ALBUTEROL SULFATE) Take 2 puffs every 6 hours as needed  #1 x 5   Entered by:   Worthy Keeler LPN   Authorized by:   Syliva Overman MD   Signed by:   Worthy Keeler LPN on 56/21/3086   Method used:   Electronically to        [facility]* (retail)       [address] [place], [place]  [address]       Ph: [phone]       Fax: [phone]   RxID:   [id number] LOTENSIN HCT 20-12.5 MG TABS (BENAZEPRIL-HYDROCHLOROTHIAZIDE) two tabs by mouth qd  #60 x 5   Entered by:   Worthy Keeler LPN   Authorized by:   Syliva Overman MD   Signed by:   Worthy Keeler LPN on 87/56/4332   Method used:   Electronically to        [facility]* (retail)       [address]       [place], [place]  [address]       Ph: [phone]       Fax: [phone]   RxID:   [id number]    Medication Administration  Injection # 1:    Medication: Depo- Medrol 80mg     Diagnosis: HIP PAIN, RIGHT (ICD-719.45)    Route: IM    Site: RUOQ gluteus    Exp Date: 7/12    Lot #: [id number]    Mfr: [employer]    Patient tolerated injection without complications    Given by: Worthy Keeler LPN (August 08, 2009 12:23 PM)  Injection # 2:    Medication: Ketorolac-Toradol 15mg     Diagnosis: HIP PAIN, RIGHT (ICD-719.45)    Route: IM    Site: LUOQ gluteus    Exp Date: 01/09/2011    Lot #: [id number]    Mfr: [email or web address]    Comments: toradol 60mg  given    Patient tolerated injection without complications    Given by: Worthy Keeler LPN (August 08, 2009 12:23 PM)  Orders Added: 1)  Est. Patient Level IV [16073] 2)  Depo- Medrol 80mg  [J1040] 3)  Ketorolac-Toradol 15mg  [J1885] 4)  Admin of Therapeutic Inj  intramuscular or subcutaneous [71062]

## 2011-01-08 NOTE — Progress Notes (Signed)
Summary: HEADACHE  Phone Note Call from Patient   Summary of Call: LOW GRADE FEVER SINCE YESTERDAY, MUCOS COMING OUT OF NOSE IS RED AND BLACK AND A POUNDING HEADACHE AND WOULD LIKE SOMETHING [facility]. CALL BACK AT [phone] Initial call taken by: Lind Guest,  November 07, 2008 8:30 AM  Follow-up for Phone Call        neeeds appt on Tuesday if possible if not then on wed advise ED eval if symptoms worsen Follow-up by: Syliva Overman MD,  November 07, 2008 9:03 PM  Additional Follow-up for Phone Call Additional follow up Details #1::        feels better and has appoinment next week Additional Follow-up by: Lind Guest,  November 08, 2008 7:58 AM

## 2011-01-08 NOTE — Miscellaneous (Signed)
Summary: refill  Clinical Lists Changes  Medications: Rx of SYMBICORT 80-4.5 MCG/ACT  AERO (BUDESONIDE-FORMOTEROL FUMARATE) Inhale two puffs twice daily;  #1 x 4;  Signed;  Entered by: Worthy Keeler LPN;  Authorized by: Syliva Overman MD;  Method used: Electronically to [facility] [address] [place], [place], [place]  [address], Ph: [phone], Fax: [phone]    Prescriptions: SYMBICORT 80-4.5 MCG/ACT  AERO (BUDESONIDE-FORMOTEROL FUMARATE) Inhale two puffs twice daily  #1 x 4   Entered by:   Worthy Keeler LPN   Authorized by:   Syliva Overman MD   Signed by:   Worthy Keeler LPN on 96/29/5284   Method used:   Electronically to        [facility]* (retail)       [address] [place], [place]  [address]       Ph: [phone]       Fax: [phone]   RxID:   [id number]

## 2011-01-08 NOTE — Assessment & Plan Note (Signed)
Summary: office visit  Nurse Visit   Vitals Entered By: Everitt Amber (September 15, 2009 10:10 AM)  Allergies: No Known Drug Allergies  Orders Added: 1)  Influenza Vaccine NON MCR [00028]   Influenza Vaccine    Vaccine Type: Fluvax Non-MCR    Site: right deltoid    Mfr: Novartis    Dose: 0.5 ml    Route: IM    Given by: Everitt Amber    Exp. Date: 03/2010    Lot #: [id number]

## 2011-01-08 NOTE — Miscellaneous (Signed)
Summary: refill  Clinical Lists Changes  Medications: Added new medication of LOTENSIN HCT 20-12.5 MG TABS (BENAZEPRIL-HYDROCHLOROTHIAZIDE) two tabs by mouth qd - Signed Rx of LOTENSIN HCT 20-12.5 MG TABS (BENAZEPRIL-HYDROCHLOROTHIAZIDE) two tabs by mouth qd;  #60 x 3;  Signed;  Entered by: Worthy Keeler LPN;  Authorized by: Syliva Overman MD;  Method used: Electronically to [facility]*, [address], [place], [place], [place]  [address], Ph: [phone], Fax: [phone]    Prescriptions: LOTENSIN HCT 20-12.5 MG TABS (BENAZEPRIL-HYDROCHLOROTHIAZIDE) two tabs by mouth qd  #60 x 3   Entered by:   Worthy Keeler LPN   Authorized by:   Syliva Overman MD   Signed by:   Worthy Keeler LPN on 32/44/0102   Method used:   Electronically to        [facility]* (retail)       [address]       [place], [place]  [address]       Ph: [phone]       Fax: [phone]   RxID:   [id number]

## 2011-01-08 NOTE — Miscellaneous (Signed)
Summary: Med change  Clinical Lists Changes  Medications: Changed medication from XOPENEX HFA 45 MCG/ACT  AERO (LEVALBUTEROL TARTRATE) Inhale one puff once daily as needed to PROVENTIL HFA 108 (90 BASE) MCG/ACT AERS (ALBUTEROL SULFATE) Take 2 puffs every 6 hours as needed - Signed Rx of PROVENTIL HFA 108 (90 BASE) MCG/ACT AERS (ALBUTEROL SULFATE) Take 2 puffs every 6 hours as needed;  #1 x 2;  Signed;  Entered by: Everitt Amber;  Authorized by: Syliva Overman MD;  Method used: Electronically to [facility]*, [address], [place], [place], [place]  [address], Ph: [phone], Fax: [phone]    Prescriptions: PROVENTIL HFA 108 (90 BASE) MCG/ACT AERS (ALBUTEROL SULFATE) Take 2 puffs every 6 hours as needed  #1 x 2   Entered by:   Everitt Amber   Authorized by:   Syliva Overman MD   Signed by:   Everitt Amber on 01/06/2009   Method used:   Electronically to        [facility]* (retail)       [address]       [place], [place]  [address]       Ph: [phone]       Fax: [phone]   RxID:   [id number]

## 2011-01-08 NOTE — Letter (Signed)
Summary: External Correspondence  External Correspondence   Imported By: Rudene Anda 05/19/2009 14:34:44  _____________________________________________________________________  External Attachment:    Type:   Image     Comment:   External Document

## 2011-01-08 NOTE — Assessment & Plan Note (Signed)
Summary: office visit   Vital Signs:  Patient Profile:   60 Years Old Female Height:     66 inches Weight:      219 pounds BMI:     35.48 O2 Sat:      95 % Pulse rate:   77 / minute Resp:     16 per minute BP sitting:   120 / 84  Pt. in pain?   no  Vitals Entered By: Everitt Amber (November 15, 2008 11:00 AM)                  Chief Complaint:  Follow up and still having some chest congestion .  History of Present Illness: Pt reports that she continues to have a chronic dry cough . She denies fever or chills. She denies sinus pressure or nasal drainage..The pt continues to experience chronic back and hip pain which are unchanged. She denies any recent fever or chills.. she denies depression or anxiety or insomnia.    Updated Prior Medication List: CETIRIZINE HCL 10 MG  TABS (CETIRIZINE HCL) Take 1 tablet by mouth once a day OSCAL 500/200 D-3 500-200 MG-UNIT  TABS (CALCIUM-VITAMIN D) Take 1 tablet by mouth three times a day LIPITOR 40 MG  TABS (ATORVASTATIN CALCIUM) Take 1 tab by mouth at bedtime ADULT ASPIRIN LOW STRENGTH 81 MG  TBDP (ASPIRIN) Take 1 tablet by mouth once a day PATANOL 0.1 %  SOLN (OLOPATADINE HCL) One drop each eye twice daily SYMBICORT 80-4.5 MCG/ACT  AERO (BUDESONIDE-FORMOTEROL FUMARATE) Inhale two puffs twice daily XOPENEX HFA 45 MCG/ACT  AERO (LEVALBUTEROL TARTRATE) Inhale one puff once daily as needed SINGULAIR 10 MG  TABS (MONTELUKAST SODIUM) one tab by mouth once daily EQ IBUPROFEN 200 MG  TABS (IBUPROFEN) take two tab by mouth once daily as needed ARTHROTEC 75 75-200 MG-MCG TABS (DICLOFENAC-MISOPROSTOL) Take 1 tablet by mouth two times a day LOTENSIN HCT 20-12.5 MG TABS (BENAZEPRIL-HYDROCHLOROTHIAZIDE) two tabs by mouth qd NORVASC 5 MG TABS (AMLODIPINE BESYLATE) Take 1 tab by mouth at bedtime  Current Allergies: No known allergies      Review of Systems  General      Denies chills, fatigue, fever, loss of appetite, malaise, sleep disorder,  sweats, weakness, and weight loss.  ENT      Complains of postnasal drainage.      Denies earache and sinus pressure.      pt has hearing loss  CV      Denies chest pain or discomfort, palpitations, shortness of breath with exertion, and swelling of feet.  Resp      Complains of cough.      Denies sputum productive and wheezing.  GI      Denies abdominal pain, constipation, nausea, and vomiting.  GU      Denies dysuria and urinary frequency.  MS      Complains of joint pain, low back pain, mid back pain, and stiffness.  Allergy      Complains of seasonal allergies.   Physical Exam  General:     overweight-appearing.   Head:     Normocephalic and atraumatic without obvious abnormalities. No apparent alopecia or balding. Eyes:     reduced vision Ears:     R ear normal and L ear normal.   Nose:     no external erythema and nasal dischargemucosal pallor.   Mouth:     poor dentition.   Neck:     No deformities, masses, or tenderness noted.  Lungs:     Normal respiratory effort, chest expands symmetrically. Lungs are clear to auscultation, no crackles or wheezes. Heart:     Normal rate and regular rhythm. S1 and S2 normal without gallop, murmur, click, rub or other extra sounds. Abdomen:     soft and non-tender.   Msk:     decrease rOM spine and R hip Extremities:     no edema Neurologic:     alert & oriented X3.  strength normal in all extremities and sensation intact to light touch.   Skin:     Intact without suspicious lesions or rashes Cervical Nodes:     No lymphadenopathy noted Psych:     Cognition and judgment appear intact. Alert and cooperative with normal attention span and concentration. No apparent delusions, illusions, hallucinations    Impression & Recommendations:  Problem # 1:  HYPERTENSION (ICD-401.9) Assessment: Improved  Her updated medication list for this problem includes:    Lotensin Hct 20-12.5 Mg Tabs  (Benazepril-hydrochlorothiazide) .Marland Kitchen..Marland Kitchen Two tabs by mouth qd    Norvasc 5 Mg Tabs (Amlodipine besylate) .Marland Kitchen... Take 1 tab by mouth at bedtime  Orders: T-Basic Metabolic Panel ([id number])  BP today: 120/84 Prior BP: 140/98 (09/12/2008)  Labs Reviewed: Creat: 0.58 (12/11/2007) Chol: 162 (05/03/2008)   HDL: 49 (05/03/2008)   LDL: 98 (05/03/2008)   TG: 76 (05/03/2008)   Problem # 2:  HYPERLIPIDEMIA (ICD-272.4)  Her updated medication list for this problem includes:    Lipitor 40 Mg Tabs (Atorvastatin calcium) .Marland Kitchen... Take 1 tab by mouth at bedtime  Orders: T-Hepatic Function [phone]) T-Lipid Profile [phone])  Labs Reviewed: Chol: 162 (05/03/2008)   HDL: 49 (05/03/2008)   LDL: 98 (05/03/2008)   TG: 76 (05/03/2008) SGOT: 25 (05/03/2008)   SGPT: 32 (05/03/2008)   Problem # 3:  OBESITY (ICD-278.00) Assessment: Unchanged  Problem # 4:  BACK PAIN (ICD-724.5) Assessment: Unchanged  Her updated medication list for this problem includes:    Adult Aspirin Low Strength 81 Mg Tbdp (Aspirin) .Marland Kitchen... Take 1 tablet by mouth once a day    Eq Ibuprofen 200 Mg Tabs (Ibuprofen) .Marland Kitchen... Take two tab by mouth once daily as needed    Arthrotec 75 75-200 Mg-mcg Tabs (Diclofenac-misoprostol) .Marland Kitchen... Take 1 tablet by mouth two times a day   Complete Medication List: 1)  Cetirizine Hcl 10 Mg Tabs (Cetirizine hcl) .... Take 1 tablet by mouth once a day 2)  Oscal 500/200 D-3 500-200 Mg-unit Tabs (Calcium-vitamin d) .... Take 1 tablet by mouth three times a day 3)  Lipitor 40 Mg Tabs (Atorvastatin calcium) .... Take 1 tab by mouth at bedtime 4)  Adult Aspirin Low Strength 81 Mg Tbdp (Aspirin) .... Take 1 tablet by mouth once a day 5)  Patanol 0.1 % Soln (Olopatadine hcl) .... One drop each eye twice daily 6)  Symbicort 80-4.5 Mcg/act Aero (Budesonide-formoterol fumarate) .... Inhale two puffs twice daily 7)  Xopenex Hfa 45 Mcg/act Aero (Levalbuterol tartrate) .... Inhale one puff once daily as  needed 8)  Singulair 10 Mg Tabs (Montelukast sodium) .... One tab by mouth once daily 9)  Eq Ibuprofen 200 Mg Tabs (Ibuprofen) .... Take two tab by mouth once daily as needed 10)  Arthrotec 75 75-200 Mg-mcg Tabs (Diclofenac-misoprostol) .... Take 1 tablet by mouth two times a day 11)  Lotensin Hct 20-12.5 Mg Tabs (Benazepril-hydrochlorothiazide) .... Two tabs by mouth qd 12)  Norvasc 5 Mg Tabs (Amlodipine besylate) .... Take 1 tab by mouth at bedtime  Other Orders:  T-CBC w/Diff ([id number])   Patient Instructions: 1)  Please schedule a follow-up appointment in 3 months. 2)  It is important that you exercise regularly at least 20 minutes 5 times a week. If you develop chest pain, have severe difficulty breathing, or feel very tired , stop exercising immediately and seek medical attention. 3)  You need to lose weight. Consider a lower calorie diet and regular exercise.  4)  You will get the H1N1 vaccine today. 5)  BMP prior to visit, ICD-9: 6)  Hepatic Panel prior to visit, ICD-9:   fasting in 4 months. 7)  Lipid Panel prior to visit, ICD-9: 8)  CBC w/ Diff prior to visit, ICD-9:   Prescriptions: SINGULAIR 10 MG  TABS (MONTELUKAST SODIUM) one tab by mouth once daily  #30 x 5   Entered by:   Worthy Keeler LPN   Authorized by:   Syliva Overman MD   Signed by:   Worthy Keeler LPN on 91/47/8295   Method used:   Electronically to        [facility] (retail)       [address]       [place], [place]  [address]       Ph: [phone]       Fax: [phone]   RxID:   [id number] LIPITOR 40 MG  TABS (ATORVASTATIN CALCIUM) Take 1 tab by mouth at bedtime  #30 x 5   Entered by:   Worthy Keeler LPN   Authorized by:   Syliva Overman MD   Signed by:   Worthy Keeler LPN on 42/59/5638   Method used:   Electronically to        [facility] (retail)       [address] [place], [place]  [address]       Ph:  [phone]       Fax: [phone]   RxID:   [id number] CETIRIZINE HCL 10 MG  TABS (CETIRIZINE HCL) Take 1 tablet by mouth once a day  #30 x 5   Entered by:   Worthy Keeler LPN   Authorized by:   Syliva Overman MD   Signed by:   Worthy Keeler LPN on 06/01/7627   Method used:   Electronically to        [facility] (retail)       [address]       [place], [place]  [address]       Ph: [phone]       Fax: [phone]   RxID:   [id number]  ]

## 2011-01-08 NOTE — Miscellaneous (Signed)
Summary: refill  Clinical Lists Changes  Medications: Rx of CETIRIZINE HCL 10 MG  TABS (CETIRIZINE HCL) Take 1 tablet by mouth once a day;  #30 x 2;  Signed;  Entered by: Worthy Keeler LPN;  Authorized by: Syliva Overman MD;  Method used: Electronically to [facility]*, [address], [place], [place], [place]  [address], Ph: [phone], Fax: [phone]    Prescriptions: CETIRIZINE HCL 10 MG  TABS (CETIRIZINE HCL) Take 1 tablet by mouth once a day  #30 x 2   Entered by:   Worthy Keeler LPN   Authorized by:   Syliva Overman MD   Signed by:   Worthy Keeler LPN on 13/07/6577   Method used:   Electronically to        [facility] (retail)       [address]       [place], [place]  [address]       Ph: [phone]       Fax: [phone]   RxID:   [id number]

## 2011-01-08 NOTE — Letter (Signed)
Summary: External Correspondence  External Correspondence   Imported By: Rudene Anda 05/22/2009 10:11:30  _____________________________________________________________________  External Attachment:    Type:   Image     Comment:   External Document

## 2011-01-08 NOTE — Miscellaneous (Signed)
  Clinical Lists Changes  Medications: Removed medication of SIMVASTATIN 40 MG TABS (SIMVASTATIN) one tab by mouth at bedtime   discontinue lipitor Added new medication of LOVASTATIN 40 MG TABS (LOVASTATIN) Take 1 tab by mouth at bedtime - Signed Rx of LOVASTATIN 40 MG TABS (LOVASTATIN) Take 1 tab by mouth at bedtime;  #30 x 3;  Signed;  Entered by: Syliva Overman MD;  Authorized by: Syliva Overman MD;  Method used: Historical    Prescriptions: LOVASTATIN 40 MG TABS (LOVASTATIN) Take 1 tab by mouth at bedtime  #30 x 3   Entered and Authorized by:   Syliva Overman MD   Signed by:   Syliva Overman MD on 08/26/2010   Method used:   Historical   RxID:   [id number]

## 2011-01-08 NOTE — Progress Notes (Signed)
  Phone Note From Pharmacy   Caller: [facility] Summary of Call: symbicort requires pa.  ok to change to qvar? Initial call taken by: Adella Hare LPN,  March 19, 2010 4:48 PM  Follow-up for Phone Call        yes , pls stamp and fax and let pt know , write d/c symbicort on script before faxing  Follow-up by: Syliva Overman MD,  March 19, 2010 5:04 PM    New/Updated Medications: QVAR 40 MCG/ACT AERS (BECLOMETHASONE DIPROPIONATE) 2 puffs twice daily Prescriptions: QVAR 40 MCG/ACT AERS (BECLOMETHASONE DIPROPIONATE) 2 puffs twice daily  #1 x 3   Entered and Authorized by:   Syliva Overman MD   Signed by:   Syliva Overman MD on 03/19/2010   Method used:   Printed then faxed to ...       [facility] (retail)       [address]       [place], [place]  [address]       Ph: [phone]       Fax: [phone]   RxID:   [id number]

## 2011-01-08 NOTE — Miscellaneous (Signed)
Summary: refill  Clinical Lists Changes  Medications: Rx of ARTHROTEC 75 75-200 MG-MCG TABS (DICLOFENAC-MISOPROSTOL) Take 1 tablet by mouth two times a day;  #60 x 2;  Signed;  Entered by: Worthy Keeler LPN;  Authorized by: Syliva Overman MD;  Method used: Electronically to [facility]*, [address], [place], [place], [place]  [address], Ph: [phone], Fax: [phone]    Prescriptions: ARTHROTEC 75 75-200 MG-MCG TABS (DICLOFENAC-MISOPROSTOL) Take 1 tablet by mouth two times a day  #60 x 2   Entered by:   Worthy Keeler LPN   Authorized by:   Syliva Overman MD   Signed by:   Worthy Keeler LPN on 95/18/8416   Method used:   Electronically to        [facility] (retail)       [address]       [place], [place]  [address]       Ph: [phone]       Fax: [phone]   RxID:   [id number]

## 2011-01-08 NOTE — Assessment & Plan Note (Signed)
Summary: FLU SHOT  Nurse Visit   Allergies: No Known Drug Allergies  Immunizations Administered:  Influenza Vaccine:    Vaccine Type: Fluvax Non-MCR    Site: left deltoid    Mfr: novartis    Dose: 0.5 ml    Route: IM    Given by: Mauricia Area CMA    Exp. Date: 04/2011    Lot #: [id number]    VIS given: 07/03/10 version given September 26, 2010.  Orders Added: 1)  Influenza Vaccine NON MCR [00028]   Immunizations Administered:  Influenza Vaccine:    Vaccine Type: Fluvax Non-MCR    Site: left deltoid    Mfr: novartis    Dose: 0.5 ml    Route: IM    Given by: Mauricia Area CMA    Exp. Date: 04/2011    Lot #: [id number]    VIS given: 07/03/10 version given September 26, 2010.

## 2011-01-08 NOTE — Miscellaneous (Signed)
Summary: Refill  Clinical Lists Changes  Medications: Rx of SYMBICORT 80-4.5 MCG/ACT  AERO (BUDESONIDE-FORMOTEROL FUMARATE) Inhale two puffs twice daily;  #1 x 3;  Signed;  Entered by: Everitt Amber;  Authorized by: Syliva Overman MD;  Method used: Electronically to [facility]*, [address], [place], [place], [place]  [address], Ph: [phone], Fax: [phone]    Prescriptions: SYMBICORT 80-4.5 MCG/ACT  AERO (BUDESONIDE-FORMOTEROL FUMARATE) Inhale two puffs twice daily  #1 x 3   Entered by:   Everitt Amber   Authorized by:   Syliva Overman MD   Signed by:   Everitt Amber on 07/11/2009   Method used:   Electronically to        [facility] (retail)       [address] [place], [place]  [address]       Ph: [phone]       Fax: [phone]   RxID:   [id number]

## 2011-01-08 NOTE — Miscellaneous (Signed)
Summary: refill  Clinical Lists Changes  Medications: Rx of NORVASC 5 MG TABS (AMLODIPINE BESYLATE) Take 1 tab by mouth at bedtime;  #30 x 3;  Signed;  Entered by: Worthy Keeler LPN;  Authorized by: Syliva Overman MD;  Method used: Electronically to [facility]*, [address] [place], [place], [place]  [address], Ph: [phone], Fax: [phone]    Prescriptions: NORVASC 5 MG TABS (AMLODIPINE BESYLATE) Take 1 tab by mouth at bedtime  #30 x 3   Entered by:   Worthy Keeler LPN   Authorized by:   Syliva Overman MD   Signed by:   Worthy Keeler LPN on 78/46/9629   Method used:   Electronically to        [facility] (retail)       [address]       [place], [place]  [address]       Ph: [phone]       Fax: [phone]   RxID:   [id number]

## 2011-01-08 NOTE — Assessment & Plan Note (Signed)
Summary: office visit   Vital Signs:  Patient profile:   60 year old female Menstrual status:  hysterectomy Height:      66 inches Weight:      216.25 pounds BMI:     35.03 O2 Sat:      99 % on Room air Pulse rate:   69 / minute Pulse rhythm:   regular Resp:     16 per minute BP sitting:   130 / 82  (left arm)  Vitals Entered By: Adella Hare LPN (August 21, 2010 10:59 AM)  Nutrition Counseling: Patient's BMI is greater than 25 and therefore counseled on weight management options.  O2 Flow:  Room air CC: follow-up visit Is Patient Diabetic? No Pain Assessment Patient in pain? no        CC:  follow-up visit.  History of Present Illness: Reports  that she had been doing fairly well, up until the past 2 week when she developed respiratory symptoms which have worsened Denies recent fever , she has had  chills.  Denies chest pain, palpitations, PND, orthopnea or leg swelling. Denies abdominal pain, nausea, vomitting, diarrhea or constipation. Denies change in bowel movements or bloody stool. Denies dysuria , frequency, incontinence or hesitancy. Denies  joint pain, swelling, or reduced mobility. Denies headaches, vertigo, seizures. Denies depression, anxiety or insomnia. Denies  rash, lesions, or itch.     Current Medications (verified): 1)  Cetirizine Hcl 10 Mg  Tabs (Cetirizine Hcl) .... Take 1 Tablet By Mouth Once A Day 2)  Oscal 500/200 D-3 500-200 Mg-Unit  Tabs (Calcium-Vitamin D) .... Take 1 Tablet By Mouth Three Times A Day 3)  Adult Aspirin Low Strength 81 Mg  Tbdp (Aspirin) .... Take 1 Tablet By Mouth Once A Day 4)  Patanol 0.1 %  Soln (Olopatadine Hcl) .... One Drop Each Eye Twice Daily 5)  Proventil Hfa 108 (90 Base) Mcg/act Aers (Albuterol Sulfate) .... Take 2 Puffs Every 6 Hours As Needed 6)  Arthrotec 75 75-200 Mg-Mcg Tabs (Diclofenac-Misoprostol) .... Take 1 Tablet By Mouth Two Times A Day 7)  Lotensin Hct 20-12.5 Mg Tabs  (Benazepril-Hydrochlorothiazide) .... Two Tabs By Mouth Qd 8)  Norvasc 5 Mg Tabs (Amlodipine Besylate) .... Take 1 Tab By Mouth At Bedtime 9)  Simvastatin 40 Mg Tabs (Simvastatin) .... One Tab By Mouth At Bedtime   Discontinue Lipitor 10)  Qvar 40 Mcg/act Aers (Beclomethasone Dipropionate) .... 2 Puffs Twice Daily  Allergies (verified): No Known Drug Allergies  Review of Systems      See HPI General:  Complains of chills, fatigue, and malaise; denies fever; 2 week. ENT:  Complains of nasal congestion, postnasal drainage, sinus pressure, and sore throat; clear water nasal drainage x 2 weeks. Resp:  Complains of cough, shortness of breath, and sputum productive; 2 week h/o with green sputum x 2 days. MS:  Complains of joint pain, low back pain, mid back pain, and stiffness; 1 week of increased right hip pain. Endo:  Denies excessive thirst and excessive urination. Heme:  Denies abnormal bruising and bleeding. Allergy:  Complains of seasonal allergies.  Physical Exam  General:  Well-developed,obesen no acute distress; alert,appropriate and cooperative throughout examination HEENT: No facial asymmetry,  EOMI, maxillary sinus tenderness, TM's Clear, oropharynx  pink and moist.   Chest: decreased air entry, bilateral crackles and wheezes CVS: S1, S2, No murmurs, No S3.   Abd: Soft, Nontender.  MS: Adequate ROM spine,decreased ROM right hip,adequate in  shoulders and knees.  Ext: No edema.  CNS: CN 2-12 intact, power tone and sensation normal throughout.   Skin: Intact, no visible lesions or rashes.  Psych: Good eye contact, normal affect.  Memory intact, not anxious or depressed appearing.    Impression & Recommendations:  Problem # 1:  ACUTE BRONCHITIS (ICD-466.0) Assessment Comment Only  Her updated medication list for this problem includes:    Proventil Hfa 108 (90 Base) Mcg/act Aers (Albuterol sulfate) .Marland Kitchen... Take 2 puffs every 6 hours as needed    Qvar 40 Mcg/act Aers  (Beclomethasone dipropionate) .Marland Kitchen... 2 puffs twice daily    Penicillin V Potassium 500 Mg Tabs (Penicillin v potassium) .Marland Kitchen... Take 1 tablet by mouth three times a day  Problem # 2:  HIP PAIN, RIGHT (ICD-719.45) Assessment: Deteriorated  Her updated medication list for this problem includes:    Adult Aspirin Low Strength 81 Mg Tbdp (Aspirin) .Marland Kitchen... Take 1 tablet by mouth once a day    Arthrotec 75 75-200 Mg-mcg Tabs (Diclofenac-misoprostol) .Marland Kitchen... Take 1 tablet by mouth two times a day  Orders: Ketorolac-Toradol 15mg  (Q2595) Admin of Therapeutic Inj  intramuscular or subcutaneous ([id number])  Problem # 3:  HYPERTENSION (ICD-401.9) Assessment: Unchanged  Her updated medication list for this problem includes:    Lotensin Hct 20-12.5 Mg Tabs (Benazepril-hydrochlorothiazide) .Marland Kitchen..Marland Kitchen Two tabs by mouth qd    Norvasc 5 Mg Tabs (Amlodipine besylate) .Marland Kitchen... Take 1 tab by mouth at bedtime  Orders: T-Basic Metabolic Panel [phone])  BP today: 130/82 Prior BP: 112/80 (03/21/2010)  Labs Reviewed: K+: 4.1 (06/20/2010) Creat: : 0.73 (06/20/2010)   Chol: 176 (06/20/2010)   HDL: 59 (06/20/2010)   LDL: 96 (06/20/2010)   TG: 105 (06/20/2010)  Problem # 4:  OBESITY (ICD-278.00) Assessment: Unchanged  Ht: 66 (08/21/2010)   Wt: 216.25 (08/21/2010)   BMI: 35.03 (08/21/2010) therapeutic lifestyle change discussed and encouraged  Problem # 5:  HYPERLIPIDEMIA (ICD-272.4) Assessment: Unchanged  Her updated medication list for this problem includes:    Simvastatin 40 Mg Tabs (Simvastatin) ..... One tab by mouth at bedtime   discontinue lipitor Low fat dietdiscussed and encouraged  Orders: T-Hepatic Function [phone] T-Lipid Profile [id number]  Labs Reviewed: SGOT: 29 (06/20/2010)   SGPT: 34 (06/20/2010)   HDL:59 (06/20/2010), 46 (12/29/2009)  LDL:96 (06/20/2010), 81 (12/29/2009)  Chol:176 (06/20/2010), 143 (12/29/2009)  Trig:105 (06/20/2010), 82 (12/29/2009)  Problem # 6:  ACUTE SINUSITIS,  UNSPECIFIED (ICD-461.9) Assessment: Comment Only  Her updated medication list for this problem includes:    Penicillin V Potassium 500 Mg Tabs (Penicillin v potassium) .Marland Kitchen... Take 1 tablet by mouth three times a day  Complete Medication List: 1)  Oscal 500/200 D-3 500-200 Mg-unit Tabs (Calcium-vitamin d) .... Take 1 tablet by mouth three times a day 2)  Adult Aspirin Low Strength 81 Mg Tbdp (Aspirin) .... Take 1 tablet by mouth once a day 3)  Patanol 0.1 % Soln (Olopatadine hcl) .... One drop each eye twice daily 4)  Proventil Hfa 108 (90 Base) Mcg/act Aers (Albuterol sulfate) .... Take 2 puffs every 6 hours as needed 5)  Arthrotec 75 75-200 Mg-mcg Tabs (Diclofenac-misoprostol) .... Take 1 tablet by mouth two times a day 6)  Lotensin Hct 20-12.5 Mg Tabs (Benazepril-hydrochlorothiazide) .... Two tabs by mouth qd 7)  Norvasc 5 Mg Tabs (Amlodipine besylate) .... Take 1 tab by mouth at bedtime 8)  Simvastatin 40 Mg Tabs (Simvastatin) .... One tab by mouth at bedtime   discontinue lipitor 9)  Qvar 40 Mcg/act Aers (Beclomethasone dipropionate) .... 2 puffs twice daily 10)  Claritin  10 Mg Tabs (Loratadine) .... Take 1 tablet by mouth once a day' 11)  Penicillin V Potassium 500 Mg Tabs (Penicillin v potassium) .... Take 1 tablet by mouth three times a day  Patient Instructions: 1)  Please schedule a follow-up appointment in 4 months. 2)  You will be treated forsinusitis and bronchitis, 3)  You will get decongestant DELSYN take1 tsp 3 times daily , and penicillin is sent in 4)  Return/ call for your flu vaccine early October. 5)  Toradol today for hip pain 6)  BMP prior to visit, ICD-9: 7)  Hepatic Panel prior to visit, ICD-9:   fasting labsin 4 months 8)  Lipid Panel prior to visit, ICD-9: Prescriptions: PENICILLIN V POTASSIUM 500 MG TABS (PENICILLIN V POTASSIUM) Take 1 tablet by mouth three times a day  #30 x 0   Entered and Authorized by:   Syliva Overman MD   Signed by:   Syliva Overman  MD on 08/21/2010   Method used:   Electronically to        [facility] (retail)       [address] [place], [place]  [address]       Ph: [phone]       Fax: [phone]   RxID:   [id number] CLARITIN 10 MG TABS (LORATADINE) Take 1 tablet by mouth once a day'  #90 x 1   Entered and Authorized by:   Syliva Overman MD   Signed by:   Syliva Overman MD on 08/21/2010   Method used:   Print then Give to Patient   RxID:   [id number]    Medication Administration  Injection # 1:    Medication: Ketorolac-Toradol 15mg     Diagnosis: HIP PAIN, RIGHT (ICD-719.45)    Route: IM    Site: LUOQ gluteus    Exp Date: 02/07/2012    Lot #: [id number]    Mfr: novaplus    Comments: toradol 60mg  given    Patient tolerated injection without complications    Given by: Adella Hare LPN (August 21, 2010 11:44 AM)  Orders Added: 1)  Est. Patient Level IV [38756] 2)  T-Basic Metabolic Panel [[id number]] 3)  T-Hepatic Function [[id number]] 4)  T-Lipid Profile [[id number]] 5)  Ketorolac-Toradol 15mg  [J1885] 6)  Admin of Therapeutic Inj  intramuscular or subcutaneous [96372]   Appended Document: office visit pt needs to stp the simvastatin since on amlodipine, and start new lovastatin 40mg  one at night . pls let her know this, i am entering it as historic,pls do the rest  Appended Document: office visit called patient, no answer  Appended Document: office visit called patient, left message  Appended Document: office visit rx sent, patient aware

## 2011-01-08 NOTE — Assessment & Plan Note (Signed)
Summary: F UP   Vital Signs:  Patient Profile:   60 Years Old Female Height:     66 inches (167.64 cm) Weight:      217.56 pounds (98.89 kg) BMI:     35.24 O2 Sat:      96 % Pulse rate:   78 / minute Pulse rhythm:   regular Resp:     16 per minute BP sitting:   126 / 86  (left arm)  Pt. in pain?   yes    Location:   r hip    Intensity:   9    Type:       aching  Vitals Entered By: Everitt Amber (February 13, 2009 9:27 AM)                  Chief Complaint:  Follow up chronic problems, right hip has been hurting her, and needs pain injection.  History of Present Illness: c/O intermittent R sided headache, no fever or chills. She also has had vomitting ad diarreah 2 weeks ago which resolve on its own increased R hip pain requesting a shot. co head and chest congestion for thepast 1 week. she has chronic allergies.she reports yellow nasal drainage and yellow sputum. she denies depression or anxiety.      Current Allergies: No known allergies     Risk Factors: Tobacco use:  quit Drug use:  no Alcohol use:  no  PAP Smear History:    Date of Last PAP Smear:  02/03/2008  Mammogram History:     Date of Last Mammogram:  05/20/2008    Results:  normal   Review of Systems  General      Denies chills and fever.  ENT      Complains of nasal congestion, sinus pressure, and sore throat.  CV      Denies chest pain or discomfort, palpitations, and swelling of feet.  Resp      Complains of cough and sputum productive.  GI      See HPI      Denies abdominal pain, constipation, diarrhea, nausea, and vomiting.  GU      Denies dysuria and urinary frequency.  MS      See HPI      Complains of joint pain, low back pain, mid back pain, and stiffness.  Derm      Denies itching and rash.  Neuro      Complains of headaches.      Denies memory loss and seizures.  Psych      Complains of anxiety.      Denies depression, suicidal thoughts/plans, and thoughts of  violence.  Allergy      Complains of seasonal allergies.   Physical Exam  General:     alert, well-hydrated, and overweight-appearing. HEENT: No facial asymmetry,  EOMI, positive sinus tenderness, TM's Clear, oropharynx  pink and moist.   Chest:decrease air entry scattered crackles. CVS: S1, S2, No murmurs, No S3.   Abd: Soft, Nontender.  EA:[id number] ROM spine and  hips,normal rOM shoulders and knees.  Ext: No edema.   CNS: CN 2-12 intact, power tone and sensation normal throughout.   Skin: Intact, no visible lesions or rashes.  Psych: Good eye contact, normal effect.  Memory intact, not anxious or depressed appearing.       Impression & Recommendations:  Problem # 1:  HIP PAIN, RIGHT (ICD-719.45) Assessment: Deteriorated  Her updated medication list for this problem includes:  Adult Aspirin Low Strength 81 Mg Tbdp (Aspirin) .Marland Kitchen... Take 1 tablet by mouth once a day    Eq Ibuprofen 200 Mg Tabs (Ibuprofen) .Marland Kitchen... Take two tab by mouth once daily as needed    Arthrotec 75 75-200 Mg-mcg Tabs (Diclofenac-misoprostol) .Marland Kitchen... Take 1 tablet by mouth two times a day toradol 60 mg IMDepomedrol 80mg  iM Orders: Ketorolac-Toradol 15mg  (E4540) Depo- Medrol 80mg  (J1040) Admin of Therapeutic Inj  intramuscular or subcutaneous (98119)   Problem # 2:  SINUSITIS, CHRONIC (ICD-473.9) Assessment: Comment Only  Her updated medication list for this problem includes:    Penicillin V Potassium 500 Mg Tabs (Penicillin v potassium) .Marland Kitchen... Take 1 tablet by mouth three times a day    Tessalon Perles 100 Mg Caps (Benzonatate) .Marland Kitchen... Take 1 capsule by mouth three times a day   Problem # 3:  HEADACHE (ICD-784.0) Assessment: Deteriorated  Her updated medication list for this problem includes:    Adult Aspirin Low Strength 81 Mg Tbdp (Aspirin) .Marland Kitchen... Take 1 tablet by mouth once a day    Eq Ibuprofen 200 Mg Tabs (Ibuprofen) .Marland Kitchen... Take two tab by mouth once daily as needed    Arthrotec 75 75-200  Mg-mcg Tabs (Diclofenac-misoprostol) .Marland Kitchen... Take 1 tablet by mouth two times a day toradol administered  Problem # 4:  HYPERTENSION (ICD-401.9) Assessment: Unchanged  Her updated medication list for this problem includes:    Lotensin Hct 20-12.5 Mg Tabs (Benazepril-hydrochlorothiazide) .Marland Kitchen..Marland Kitchen Two tabs by mouth qd    Norvasc 5 Mg Tabs (Amlodipine besylate) .Marland Kitchen... Take 1 tab by mouth at bedtime  BP today: 126/86 Prior BP: 120/84 (11/15/2008)  Labs Reviewed: Creat: 0.70 (12/28/2008) Chol: 162 (12/28/2008)   HDL: 56 (12/28/2008)   LDL: 85 (12/28/2008)   TG: 104 (12/28/2008)   Problem # 5:  OBESITY (ICD-278.00) Assessment: Unchanged  Problem # 6:  Preventive Health Care (ICD-V70.0) Assessment: Comment Only rectal exam for colorectal screening done and revealed no mass with guaic negative stool  Complete Medication List: 1)  Cetirizine Hcl 10 Mg Tabs (Cetirizine hcl) .... Take 1 tablet by mouth once a day 2)  Oscal 500/200 D-3 500-200 Mg-unit Tabs (Calcium-vitamin d) .... Take 1 tablet by mouth three times a day 3)  Lipitor 40 Mg Tabs (Atorvastatin calcium) .... Take 1 tab by mouth at bedtime 4)  Adult Aspirin Low Strength 81 Mg Tbdp (Aspirin) .... Take 1 tablet by mouth once a day 5)  Patanol 0.1 % Soln (Olopatadine hcl) .... One drop each eye twice daily 6)  Symbicort 80-4.5 Mcg/act Aero (Budesonide-formoterol fumarate) .... Inhale two puffs twice daily 7)  Proventil Hfa 108 (90 Base) Mcg/act Aers (Albuterol sulfate) .... Take 2 puffs every 6 hours as needed 8)  Singulair 10 Mg Tabs (Montelukast sodium) .... One tab by mouth once daily 9)  Eq Ibuprofen 200 Mg Tabs (Ibuprofen) .... Take two tab by mouth once daily as needed 10)  Arthrotec 75 75-200 Mg-mcg Tabs (Diclofenac-misoprostol) .... Take 1 tablet by mouth two times a day 11)  Lotensin Hct 20-12.5 Mg Tabs (Benazepril-hydrochlorothiazide) .... Two tabs by mouth qd 12)  Norvasc 5 Mg Tabs (Amlodipine besylate) .... Take 1 tab by mouth  at bedtime 13)  Penicillin V Potassium 500 Mg Tabs (Penicillin v potassium) .... Take 1 tablet by mouth three times a day 14)  Tessalon Perles 100 Mg Caps (Benzonatate) .... Take 1 capsule by mouth three times a day 15)  Lipitor 20 Mg Tabs (Atorvastatin calcium) .... Two tabs by mouth qhs  Other Orders: Hemoccult Guaiac-1 spec.(in office) (04540)   Patient Instructions: 1)  Please schedule a follow-up appointment in 3 months. 2)  It is important that you exercise regularly at least 40 minutes 5 times a week. If you develop chest pain, have severe difficulty breathing, or feel very tired , stop exercising immediately and seek medical attention. 3)  You need to lose weight. Consider a lower calorie diet and regular exercise. 4)  you will be treated for sinusitis and bronchitis, meds are sent to your pharmacy. 5)  you will get injections for head and hip pain. 6)  Your Jan labs were great.   Prescriptions: LOTENSIN HCT 20-12.5 MG TABS (BENAZEPRIL-HYDROCHLOROTHIAZIDE) two tabs by mouth qd  #60 Each x 5   Entered by:   Worthy Keeler LPN   Authorized by:   Syliva Overman MD   Signed by:   Worthy Keeler LPN on 98/10/9146   Method used:   Electronically to        [facility]* (retail)       [address]       [place], [place]  [address]       Ph: [phone]       Fax: [phone]   RxID:   [id number] ARTHROTEC 75 75-200 MG-MCG TABS (DICLOFENAC-MISOPROSTOL) Take 1 tablet by mouth two times a day  #60 x 5   Entered by:   Worthy Keeler LPN   Authorized by:   Syliva Overman MD   Signed by:   Worthy Keeler LPN on 03/47/4259   Method used:   Electronically to        [facility] (retail)       [address]       [place], [place]  [address]       Ph: [phone]       Fax: [phone]   RxID:   [id number] LIPITOR 20 MG TABS (ATORVASTATIN CALCIUM) two tabs by mouth qhs  #42 x 0   Entered by:    Worthy Keeler LPN   Authorized by:   Syliva Overman MD   Signed by:   Worthy Keeler LPN on 54/27/0623   Method used:   Electronically to        [facility] (retail)       [address]       [place], [place]  [address]       Ph: [phone]       Fax: [phone]   RxID:   [id number] TESSALON PERLES 100 MG CAPS (BENZONATATE) Take 1 capsule by mouth three times a day  #30 x 0   Entered and Authorized by:   Syliva Overman MD   Signed by:   Syliva Overman MD on 02/13/2009   Method used:   Electronically to        [facility] (retail)       [address] [place], [place]  [address]       Ph: [phone]       Fax: [phone]   RxID:   [id number] PENICILLIN V POTASSIUM 500 MG TABS (PENICILLIN V POTASSIUM) Take 1 tablet by mouth three times a day  #30 x 0   Entered and Authorized by:   Syliva Overman MD  Signed by:   Syliva Overman MD on 02/13/2009   Method used:   Electronically to        [facility] (retail)       [address]       [place], [place]  [address]       Ph: [phone]       Fax: [phone]   RxID:   [id number]    Preventive Care Screening  Mammogram:    Date:  05/20/2008    Results:  normal   Laboratory Results  Date/Time Received: 02/13/09 10:45am Date/Time Reported: 02/13/09 10:45am  Stool - Occult Blood Hemmoccult #1: negative Date: 02/13/2009 Comments: 51180 11L 8/11 5067 10/11     Medication Administration  Injection # 1:    Medication: Depo- Medrol 80mg     Diagnosis: HIP PAIN, RIGHT (ICD-719.45)    Route: IM    Site: RUOQ gluteus    Exp Date: 7/12    Lot #: [id number]    Mfr: Pharmacia    Patient tolerated injection without complications    Given by: Worthy Keeler LPN (February 14, 3824 10:47 AM)  Injection # 2:    Medication: Ketorolac-Toradol 15mg     Diagnosis: HIP PAIN, RIGHT  (ICD-719.45)    Route: IM    Site: LUOQ gluteus    Exp Date: 06/08/2010    Lot #: [id number]    Mfr: [email or web address]    Comments: toradol 60mg  given    Patient tolerated injection without complications    Given by: Worthy Keeler LPN (February 13, 3418 10:48 AM)  Orders Added: 1)  Est. Patient Level IV [37902] 2)  Hemoccult Guaiac-1 spec.(in office) [82272] 3)  Ketorolac-Toradol 15mg  [J1885] 4)  Depo- Medrol 80mg  [J1040] 5)  Admin of Therapeutic Inj  intramuscular or subcutaneous [40973]

## 2011-01-08 NOTE — Miscellaneous (Signed)
Summary: Refill  Clinical Lists Changes  Medications: Rx of NORVASC 5 MG TABS (AMLODIPINE BESYLATE) Take 1 tab by mouth at bedtime;  #30 x 5;  Signed;  Entered by: Everitt Amber;  Authorized by: Syliva Overman MD;  Method used: Electronically to [facility]*, [address] [place], [place], [place]  [address], Ph: [phone], Fax: [phone]    Prescriptions: NORVASC 5 MG TABS (AMLODIPINE BESYLATE) Take 1 tab by mouth at bedtime  #30 x 5   Entered by:   Everitt Amber   Authorized by:   Syliva Overman MD   Signed by:   Everitt Amber on 05/10/2009   Method used:   Electronically to        [facility] (retail)       [address] [place], [place]  [address]       Ph: [phone]       Fax: [phone]   RxID:   [id number]

## 2011-01-08 NOTE — Miscellaneous (Signed)
Summary: refill  Clinical Lists Changes  Medications: Rx of SYMBICORT 80-4.5 MCG/ACT  AERO (BUDESONIDE-FORMOTEROL FUMARATE) Inhale two puffs twice daily;  #1 x 1;  Signed;  Entered by: Worthy Keeler LPN;  Authorized by: Syliva Overman MD;  Method used: Electronically to [facility]*, [address] [place], [place], [place]  [address], Ph: [phone], Fax: [phone]    Prescriptions: SYMBICORT 80-4.5 MCG/ACT  AERO (BUDESONIDE-FORMOTEROL FUMARATE) Inhale two puffs twice daily  #1 x 1   Entered by:   Worthy Keeler LPN   Authorized by:   Syliva Overman MD   Signed by:   Worthy Keeler LPN on 13/07/6577   Method used:   Electronically to        [facility] (retail)       [address]       [place], [place]  [address]       Ph: [phone]       Fax: [phone]   RxID:   [id number]

## 2011-01-08 NOTE — Miscellaneous (Signed)
Summary: Refill  Clinical Lists Changes  Medications: Rx of LOTENSIN HCT 20-12.5 MG TABS (BENAZEPRIL-HYDROCHLOROTHIAZIDE) two tabs by mouth qd;  #60 Each x 3;  Signed;  Entered by: Everitt Amber;  Authorized by: Syliva Overman MD;  Method used: Electronically to [facility]*, [address] [place], [place], [place]  [address], Ph: [phone], Fax: [phone]    Prescriptions: LOTENSIN HCT 20-12.5 MG TABS (BENAZEPRIL-HYDROCHLOROTHIAZIDE) two tabs by mouth qd  #60 Each x 3   Entered by:   Everitt Amber   Authorized by:   Syliva Overman MD   Signed by:   Everitt Amber on 02/10/2009   Method used:   Electronically to        [facility] (retail)       [address]       [place], [place]  [address]       Ph: [phone]       Fax: [phone]   RxID:   [id number]

## 2011-01-08 NOTE — Assessment & Plan Note (Signed)
Summary: PHY   Vital Signs:  Patient profile:   60 year old female Menstrual status:  hysterectomy Height:      66 inches Weight:      221 pounds BMI:     35.80 O2 Sat:      97 % Pulse rate:   88 / minute Pulse rhythm:   regular Resp:     16 per minute BP sitting:   112 / 80  (left arm) Cuff size:   xl  Vitals Entered By: Everitt Amber LPN (March 21, 2010 10:48 AM)  Nutrition Counseling: Patient's BMI is greater than 25 and therefore counseled on weight management options. CC: CPE   CC:  CPE.  History of Present Illness: Reports  that she has been doing well. Denies recent fever or chills. Denies sinus pressure,, ear pain or sore throat.Reports nasal congestion associated with increased allergy symptoms. Denies chest congestion, or cough productive of sputum. Denies chest pain, palpitations, PND, orthopnea or leg swelling. Denies abdominal pain, nausea, vomitting, diarrhea or constipation. Denies change in bowel movements or bloody stool. Denies dysuria , frequency, incontinence or hesitancy. Reports    joint pain, swelling,and  reduced mobility, espescially of the right hip. Denies headaches, vertigo, seizures. Denies depression, anxiety or insomnia. Denies  rash, lesions, or itch.     Current Medications (verified): 1)  Cetirizine Hcl 10 Mg  Tabs (Cetirizine Hcl) .... Take 1 Tablet By Mouth Once A Day 2)  Oscal 500/200 D-3 500-200 Mg-Unit  Tabs (Calcium-Vitamin D) .... Take 1 Tablet By Mouth Three Times A Day 3)  Adult Aspirin Low Strength 81 Mg  Tbdp (Aspirin) .... Take 1 Tablet By Mouth Once A Day 4)  Patanol 0.1 %  Soln (Olopatadine Hcl) .... One Drop Each Eye Twice Daily 5)  Proventil Hfa 108 (90 Base) Mcg/act Aers (Albuterol Sulfate) .... Take 2 Puffs Every 6 Hours As Needed 6)  Arthrotec 75 75-200 Mg-Mcg Tabs (Diclofenac-Misoprostol) .... Take 1 Tablet By Mouth Two Times A Day 7)  Lotensin Hct 20-12.5 Mg Tabs (Benazepril-Hydrochlorothiazide) .... Two Tabs By  Mouth Qd 8)  Norvasc 5 Mg Tabs (Amlodipine Besylate) .... Take 1 Tab By Mouth At Bedtime 9)  Simvastatin 40 Mg Tabs (Simvastatin) .... One Tab By Mouth At Bedtime   Discontinue Lipitor 10)  Qvar 40 Mcg/act Aers (Beclomethasone Dipropionate) .... 2 Puffs Twice Daily  Allergies (verified): No Known Drug Allergies  Review of Systems      See HPI Eyes:  Complains of vision loss-both eyes. ENT:  Complains of decreased hearing; denies earache, hoarseness, nasal congestion, and sinus pressure. MS:  Complains of joint pain and stiffness; right hip pain and stiffness. Endo:  Denies cold intolerance, excessive hunger, excessive thirst, excessive urination, heat intolerance, polyuria, and weight change. Heme:  Denies abnormal bruising and bleeding. Allergy:  Complains of sneezing; denies hives or rash and itching eyes.  Physical Exam  General:  Well-developed,obesen no acute distress; alert,appropriate and cooperative throughout examination Head:  Normocephalic and atraumatic without obvious abnormalities. No apparent alopecia or balding. Eyes:  very poor vision Ears:  External ear exam shows no significant lesions or deformities.  Otoscopic examination reveals clear canals, tympanic membranes are intact bilaterally without bulging, retraction, inflammation or marked hearing loss, which is chronicdischarge. Hearing is grossly normal bilaterally. Nose:  External nasal examination shows no deformity or inflammation. Nasal mucosa are pink and moist without lesions or exudates. Mouth:  pharynx pink and moist and fair dentition.   Neck:  No deformities,  masses, or tenderness noted. Chest Wall:  No deformities, masses, or tenderness noted. Breasts:  No mass, nodules, thickening, tenderness, bulging, retraction, inflamation, nipple discharge or skin changes noted.   Lungs:  Normal respiratory effort, chest expands symmetrically. Lungs are clear to auscultation, no crackles or wheezes. Heart:  Normal rate  and regular rhythm. S1 and S2 normal without gallop, murmur, click, rub or other extra sounds. Abdomen:  Bowel sounds positive,abdomen soft and non-tender without masses, organomegaly or hernias noted. Rectal:  No external abnormalities noted. Normal sphincter tone. No rectal masses or tenderness.guaic negative stool Genitalia:  normal introitus, no external lesions, and no adnexal masses or tenderness.  Uterus absent Msk:  No deformity or scoliosis noted of thoracic or lumbar spine.   Pulses:  R and L carotid,radial,femoral,dorsalis pedis and posterior tibial pulses are full and equal bilaterally Extremities:  decreased ROM spine and  hips , adequate in shoulders and knees Neurologic:  No cranial nerve deficits noted. Station and gait are normal. Plantar reflexes are down-going bilaterally. DTRs are symmetrical throughout. Sensory, motor and coordinative functions appear intact. Skin:  Intact without suspicious lesions or rashes Cervical Nodes:  No lymphadenopathy noted Axillary Nodes:  No palpable lymphadenopathy Inguinal Nodes:  No significant adenopathy Psych:  Cognition and judgment appear intact. Alert and cooperative with normal attention span and concentration. No apparent delusions, illusions, hallucinations   Impression & Recommendations:  Problem # 1:  SCREENING FOR MALIGNANT NEOPLASM OF THE CERVIX (ICD-V76.2) Assessment Comment Only  Orders: Pap Smear (98119)  Problem # 2:  SPECIAL SCREENING FOR MALIGNANT NEOPLASMS COLON (ICD-V76.51) Assessment: Comment Only  Orders: Hemoccult Guaiac-1 spec.(in office) (82270)negative  Problem # 3:  HIP PAIN, RIGHT (ICD-719.45) Assessment: Unchanged  Her updated medication list for this problem includes:    Adult Aspirin Low Strength 81 Mg Tbdp (Aspirin) .Marland Kitchen... Take 1 tablet by mouth once a day    Arthrotec 75 75-200 Mg-mcg Tabs (Diclofenac-misoprostol) .Marland Kitchen... Take 1 tablet by mouth two times a day toradol and depomedrol  admionistered  Problem # 4:  HYPERTENSION (ICD-401.9) Assessment: Unchanged  Her updated medication list for this problem includes:    Lotensin Hct 20-12.5 Mg Tabs (Benazepril-hydrochlorothiazide) .Marland Kitchen..Marland Kitchen Two tabs by mouth qd    Norvasc 5 Mg Tabs (Amlodipine besylate) .Marland Kitchen... Take 1 tab by mouth at bedtime  Orders: T-Basic Metabolic Panel [phone])  BP today: 112/80 Prior BP: 112/84 (11/22/2009)  Labs Reviewed: K+: 3.7 (12/29/2009) Creat: : 0.74 (12/29/2009)   Chol: 143 (12/29/2009)   HDL: 46 (12/29/2009)   LDL: 81 (12/29/2009)   TG: 82 (12/29/2009)  Problem # 5:  HYPERLIPIDEMIA (ICD-272.4) Assessment: Comment Only  Her updated medication list for this problem includes:    Simvastatin 40 Mg Tabs (Simvastatin) ..... One tab by mouth at bedtime   discontinue lipitor  Orders: T-Hepatic Function [phone]) T-Lipid Profile [phone])  Labs Reviewed: SGOT: 35 (12/29/2009)   SGPT: 41 (12/29/2009)   HDL:46 (12/29/2009), 55 (07/25/2009)  LDL:81 (12/29/2009), 74 (07/25/2009)  Chol:143 (12/29/2009), 145 (07/25/2009)  Trig:82 (12/29/2009), 78 (07/25/2009)  Problem # 6:  OBESITY (ICD-278.00) Assessment: Unchanged  Ht: 66 (03/21/2010)   Wt: 221 (03/21/2010)   BMI: 35.80 (03/21/2010)  Complete Medication List: 1)  Cetirizine Hcl 10 Mg Tabs (Cetirizine hcl) .... Take 1 tablet by mouth once a day 2)  Oscal 500/200 D-3 500-200 Mg-unit Tabs (Calcium-vitamin d) .... Take 1 tablet by mouth three times a day 3)  Adult Aspirin Low Strength 81 Mg Tbdp (Aspirin) .... Take 1 tablet by mouth once  a day 4)  Patanol 0.1 % Soln (Olopatadine hcl) .... One drop each eye twice daily 5)  Proventil Hfa 108 (90 Base) Mcg/act Aers (Albuterol sulfate) .... Take 2 puffs every 6 hours as needed 6)  Arthrotec 75 75-200 Mg-mcg Tabs (Diclofenac-misoprostol) .... Take 1 tablet by mouth two times a day 7)  Lotensin Hct 20-12.5 Mg Tabs (Benazepril-hydrochlorothiazide) .... Two tabs by mouth qd 8)  Norvasc 5 Mg  Tabs (Amlodipine besylate) .... Take 1 tab by mouth at bedtime 9)  Simvastatin 40 Mg Tabs (Simvastatin) .... One tab by mouth at bedtime   discontinue lipitor 10)  Qvar 40 Mcg/act Aers (Beclomethasone dipropionate) .... 2 puffs twice daily  Other Orders: T-Vitamin D (25-Hydroxy) ([id number]) Depo- Medrol 80mg  (J1040) Ketorolac-Toradol 15mg  (H0865) Admin of Therapeutic Inj  intramuscular or subcutaneous (78469)  Patient Instructions: 1)  Please schedule a follow-up appointment in 4.5 to 5  months. 2)  You need to lose weight. Consider a lower calorie diet and regular exercise.  3)  BMP prior to visit, ICD-9: 4)  Hepatic Panel prior to visit, ICD-9:  fasting end july 5)  Lipid Panel prior to visit, ICD-9: 6)  vit D level 7)  you will get injections for hip pain today. 8)  pls call the [employer], you should get a cane, since your vision is not good     Medication Administration  Injection # 1:    Medication: Depo- Medrol 80mg     Diagnosis: HIP PAIN, RIGHT (ICD-719.45)    Route: IM    Site: RUOQ gluteus    Exp Date: 10/2010    Lot #: [id number]    Mfr: Pharmacia    Comments: 80mg  given     Patient tolerated injection without complications    Given by: Everitt Amber LPN (March 21, 2010 11:28 AM)  Injection # 2:    Medication: Ketorolac-Toradol 15mg     Diagnosis: HIP PAIN, RIGHT (ICD-719.45)    Route: IM    Site: LUOQ gluteus    Exp Date: 12/12    Lot #: [id number]     Mfr: [email or web address]    Comments: 60mg  given     Patient tolerated injection without complications    Given by: Everitt Amber LPN (March 21, 2010 11:28 AM)  Orders Added: 1)  Est. Patient 40-64 years [99396] 2)  T-Basic Metabolic Panel [phone] 3)  T-Hepatic Function [phone] 4)  T-Lipid Profile [phone] 5)  T-Vitamin D (25-Hydroxy) [phone] 6)  Depo- Medrol 80mg  [J1040] 7)  Ketorolac-Toradol 15mg  [J1885] 8)  Admin of Therapeutic Inj  intramuscular or subcutaneous [96372] 9)  Pap  Smear [88150] 10)  Hemoccult Guaiac-1 spec.(in office) [82270]   Laboratory Results    Stool - Occult Blood Hemmoccult #1: negative Date: 03/21/2010 Comments: [id number] 9r [phone]

## 2011-01-08 NOTE — Progress Notes (Signed)
  Phone Note Outgoing Call   Call placed by: Mauricia Area CMA,  September 26, 2010 11:32 AM Call placed to: Patient Summary of Call: Patient requested refill of Zyrtec, but Claritin was on medication list, so [facility] sent in refill. I notified patient, she states if she does not take Zyrtec she breaks out in hives and she already has some Claritin at home.  Follow-up for Phone Call        npls fax new rx and notuify pt Follow-up by: Syliva Overman MD,  September 26, 2010 12:14 PM  Additional Follow-up for Phone Call Additional follow up Details #1::        patient aware Additional Follow-up by: Mauricia Area CMA,  September 26, 2010 1:57 PM    New/Updated Medications: ZYRTEC HIVES RELIEF 10 MG TABS (CETIRIZINE HCL) Take 1 tablet by mouth once a day Prescriptions: ZYRTEC HIVES RELIEF 10 MG TABS (CETIRIZINE HCL) Take 1 tablet by mouth once a day  #30 x 4   Entered and Authorized by:   Syliva Overman MD   Signed by:   Syliva Overman MD on 09/26/2010   Method used:   Printed then faxed to ...       [facility]* (retail)       [address]       [place], [place]  [address]       Ph: [phone]       Fax: [phone]   RxID:   [id number]

## 2011-01-08 NOTE — Assessment & Plan Note (Signed)
Summary: office visit   Vital Signs:  Patient Profile:   60 Years Old Female Height:     66 inches Weight:      220.31 pounds BMI:     35.69 O2 Sat:      99 % Resp:     16 per minute BP sitting:   140 / 98  (right arm)  Pt. in pain?   yes    Location:   Right ear    Intensity:   9    Type:       aching  Vitals Entered By: Everitt Amber (September 12, 2008 10:53 AM)                  Chief Complaint:  R ear pain and chest c ongestion.  History of Present Illness: Persistent R ear pain and post nasal drainage R facial pain and chest tightness for 3 months. no sputun. No fever or chills,but sometimes breaks out in a sweat.  Dizziness and unsteady gait for 1 month.  R hip pain, worse when pt. is constipated per her report.    Updated Prior Medication List: CETIRIZINE HCL 10 MG  TABS (CETIRIZINE HCL) Take 1 tablet by mouth once a day OSCAL 500/200 D-3 500-200 MG-UNIT  TABS (CALCIUM-VITAMIN D) Take 1 tablet by mouth three times a day LIPITOR 40 MG  TABS (ATORVASTATIN CALCIUM) Take 1 tab by mouth at bedtime ADULT ASPIRIN LOW STRENGTH 81 MG  TBDP (ASPIRIN) Take 1 tablet by mouth once a day PATANOL 0.1 %  SOLN (OLOPATADINE HCL) One drop each eye twice daily SYMBICORT 80-4.5 MCG/ACT  AERO (BUDESONIDE-FORMOTEROL FUMARATE) Inhale two puffs twice daily XOPENEX HFA 45 MCG/ACT  AERO (LEVALBUTEROL TARTRATE) Inhale one puff once daily as needed SINGULAIR 10 MG  TABS (MONTELUKAST SODIUM) one tab by mouth once daily EQ IBUPROFEN 200 MG  TABS (IBUPROFEN) take two tab by mouth once daily as needed ARTHROTEC 75 75-200 MG-MCG  TABS (DICLOFENAC-MISOPROSTOL) one tab by mouth two times a day LOTENSIN HCT 20-12.5 MG TABS (BENAZEPRIL-HYDROCHLOROTHIAZIDE) two tabs by mouth qd  Current Allergies: No known allergies      Review of Systems  ENT      See HPI  CV      Denies chest pain or discomfort, palpitations, shortness of breath with exertion, and swelling of feet.  Resp      See  HPI  GI      Denies abdominal pain, constipation, diarrhea, and nausea.  GU      Denies dysuria and urinary frequency.  MS      See HPI  Psych      Denies anxiety and depression.  Allergy      Denies seasonal allergies.   Physical Exam  General:     overweight-appearing.   Head:     Normocephalic and atraumatic without obvious abnormalities. No apparent alopecia or balding. Eyes:     reduced vision Ears:     R tympanic membrane dull and pink Nose:     no external erythema and nasal dischargemucosal pallor.   Mouth:     poor dentition.   Neck:     No deformities, masses, or tenderness noted. Lungs:     Normal respiratory effort, chest expands symmetrically. Lungs are clear to auscultation, no crackles or wheezes. Heart:     Normal rate and regular rhythm. S1 and S2 normal without gallop, murmur, click, rub or other extra sounds. Abdomen:     soft and non-tender.  Msk:     decrease rOM spine and R hip Extremities:     no edema Skin:     Intact without suspicious lesions or rashes Cervical Nodes:     No lymphadenopathy noted Psych:     Cognition and judgment appear intact. Alert and cooperative with normal attention span and concentration. No apparent delusions, illusions, hallucinations    Impression & Recommendations:  Problem # 1:  OTITIS MEDIA, CHRONIC, RIGHT (ICD-381.3) Assessment: Comment Only  Orders: ENT Referral (ENT)   Problem # 2:  SINUSITIS, CHRONIC (ICD-473.9) Assessment: Unchanged  Her updated medication list for this problem includes:    Tessalon Perles 100 Mg Caps (Benzonatate) .Marland Kitchen... Take 1 capsule by mouth three times a day    Septra Ds 800-160 Mg Tabs (Sulfamethoxazole-trimethoprim) .Marland Kitchen... Take 1 tablet by mouth two times a day  Orders: Radiology Referral (Radiology)   Problem # 3:  BACK PAIN (ICD-724.5) Assessment: Unchanged  Her updated medication list for this problem includes:    Adult Aspirin Low Strength 81 Mg Tbdp  (Aspirin) .Marland Kitchen... Take 1 tablet by mouth once a day    Eq Ibuprofen 200 Mg Tabs (Ibuprofen) .Marland Kitchen... Take two tab by mouth once daily as needed    Arthrotec 75 75-200 Mg-mcg Tabs (Diclofenac-misoprostol) ..... One tab by mouth two times a day   Problem # 4:  VERTIGO (ICD-780.4) Assessment: Deteriorated  Her updated medication list for this problem includes:    Cetirizine Hcl 10 Mg Tabs (Cetirizine hcl) .Marland Kitchen... Take 1 tablet by mouth once a day    Antivert 25 Mg Tabs (Meclizine hcl) .Marland Kitchen... Take 1 tablet by mouth three times a day as needed  Orders: ENT Referral (ENT)   Problem # 5:  HYPERTENSION (ICD-401.9) Assessment: Deteriorated  The following medications were removed from the medication list:    Benicar Hct 40-25 Mg Tabs (Olmesartan medoxomil-hctz) ..... One tab by mouth once daily  Her updated medication list for this problem includes:    Lotensin Hct 20-12.5 Mg Tabs (Benazepril-hydrochlorothiazide) .Marland Kitchen..Marland Kitchen Two tabs by mouth qd    Norvasc 5 Mg Tabs (Amlodipine besylate) .Marland Kitchen... Take 1 tab by mouth at bedtime  BP today: 140/98 Prior BP: 128/84 (07/12/2008)  Labs Reviewed: Creat: 0.58 (12/11/2007) Chol: 162 (05/03/2008)   HDL: 49 (05/03/2008)   LDL: 98 (05/03/2008)   TG: 76 (05/03/2008)   Problem # 6:  HYPERLIPIDEMIA (ICD-272.4) Assessment: Unchanged  Her updated medication list for this problem includes:    Lipitor 40 Mg Tabs (Atorvastatin calcium) .Marland Kitchen... Take 1 tab by mouth at bedtime  Labs Reviewed: Chol: 162 (05/03/2008)   HDL: 49 (05/03/2008)   LDL: 98 (05/03/2008)   TG: 76 (05/03/2008) SGOT: 25 (05/03/2008)   SGPT: 32 (05/03/2008)   Problem # 7:  OBESITY (ICD-278.00) Assessment: Unchanged  Complete Medication List: 1)  Cetirizine Hcl 10 Mg Tabs (Cetirizine hcl) .... Take 1 tablet by mouth once a day 2)  Oscal 500/200 D-3 500-200 Mg-unit Tabs (Calcium-vitamin d) .... Take 1 tablet by mouth three times a day 3)  Lipitor 40 Mg Tabs (Atorvastatin calcium) .... Take 1 tab by  mouth at bedtime 4)  Adult Aspirin Low Strength 81 Mg Tbdp (Aspirin) .... Take 1 tablet by mouth once a day 5)  Patanol 0.1 % Soln (Olopatadine hcl) .... One drop each eye twice daily 6)  Symbicort 80-4.5 Mcg/act Aero (Budesonide-formoterol fumarate) .... Inhale two puffs twice daily 7)  Xopenex Hfa 45 Mcg/act Aero (Levalbuterol tartrate) .... Inhale one puff once daily as needed  8)  Singulair 10 Mg Tabs (Montelukast sodium) .... One tab by mouth once daily 9)  Eq Ibuprofen 200 Mg Tabs (Ibuprofen) .... Take two tab by mouth once daily as needed 10)  Arthrotec 75 75-200 Mg-mcg Tabs (Diclofenac-misoprostol) .... One tab by mouth two times a day 11)  Lotensin Hct 20-12.5 Mg Tabs (Benazepril-hydrochlorothiazide) .... Two tabs by mouth qd 12)  Norvasc 5 Mg Tabs (Amlodipine besylate) .... Take 1 tab by mouth at bedtime 13)  Tessalon Perles 100 Mg Caps (Benzonatate) .... Take 1 capsule by mouth three times a day 14)  Septra Ds 800-160 Mg Tabs (Sulfamethoxazole-trimethoprim) .... Take 1 tablet by mouth two times a day 15)  Antivert 25 Mg Tabs (Meclizine hcl) .... Take 1 tablet by mouth three times a day as needed  Other Orders: Ketorolac-Toradol 15mg  (V4098) Depo- Medrol 80mg  (J1040) Admin of Therapeutic Inj  intramuscular or subcutaneous (11914)   Patient Instructions: 1)  Please schedule a follow-up appointment in 2 months. 2)  It is important that you exercise regularly at least 20 minutes 5 times a week. If you develop chest pain, have severe difficulty breathing, or feel very tired , stop exercising immediately and seek medical attention. 3)  You need to lose weight. Consider a lower calorie diet and regular exercise. 4)  you are receiving 2 shots foryour R hip pain and will be referred to eNT as well asfor a scan of your brain. 5)  You will take amother tablet for your blood pressure, continue the lotensin/hctz as before    Prescriptions: ANTIVERT 25 MG TABS (MECLIZINE HCL) Take 1 tablet  by mouth three times a day as needed  #42 x 2   Entered and Authorized by:   Syliva Overman MD   Signed by:   Syliva Overman MD on 09/12/2008   Method used:   Electronically to        [facility]* (retail)       [address]       [place], [place]  [address]       Ph: [phone]       Fax: [phone]   RxID:   [id number] SEPTRA DS 800-160 MG TABS (SULFAMETHOXAZOLE-TRIMETHOPRIM) Take 1 tablet by mouth two times a day  #28 x 0   Entered and Authorized by:   Syliva Overman MD   Signed by:   Syliva Overman MD on 09/12/2008   Method used:   Electronically to        [facility]* (retail)       [address] [place], [place]  [address]       Ph: [phone]       Fax: [phone]   RxID:   [id number] TESSALON PERLES 100 MG CAPS (BENZONATATE) Take 1 capsule by mouth three times a day  #30 x 0   Entered and Authorized by:   Syliva Overman MD   Signed by:   Syliva Overman MD on 09/12/2008   Method used:   Electronically to        [facility]* (retail)       [address]       [place], [place]  [address]       Ph: [phone]       Fax: [phone]   RxID:   [id number]  NORVASC 5 MG TABS (AMLODIPINE BESYLATE) Take 1 tab by mouth at bedtime  #30 x 3   Entered and Authorized by:   Syliva Overman MD   Signed by:   Syliva Overman MD on 09/12/2008   Method used:   Electronically to        [facility]* (retail)       [address]       [place], [place]  [address]       Ph: [phone]       Fax: [phone]   RxID:   [id number]  ]  Medication Administration  Injection # 1:    Medication: Depo- Medrol 80mg     Diagnosis: HIP PAIN, RIGHT (ICD-719.45)    Route: IM    Site: RUOQ gluteus    Exp Date: 1/12    Lot #: [id number]    Mfr: Pharmacia    Patient tolerated injection without  complications    Given by: Worthy Keeler LPN (September 12, 2008 12:03 PM)  Injection # 2:    Medication: Ketorolac-Toradol 15mg     Diagnosis: HIP PAIN, RIGHT (ICD-719.45)    Route: IM    Site: LUOQ gluteus    Exp Date: 3/11    Lot #: [id number]    Mfr: [email or web address]    Comments: toradol 60mg  given    Patient tolerated injection without complications    Given by: Worthy Keeler LPN (September 12, 2008 12:03 PM)  Orders Added: 1)  Est. Patient Level IV [[address]] 2)  ENT Referral [ENT] 3)  Ketorolac-Toradol 15mg  [J1885] 4)  Depo- Medrol 80mg  [J1040] 5)  Admin of Therapeutic Inj  intramuscular or subcutaneous [[address]] 6)  Radiology Referral [Radiology]

## 2011-01-08 NOTE — Letter (Signed)
Summary: MEDICAL RELEASE  MEDICAL RELEASE   Imported By: Lind Guest 11/25/2008 10:04:25  _____________________________________________________________________  External Attachment:    Type:   Image     Comment:   External Document

## 2011-01-08 NOTE — Miscellaneous (Signed)
Summary: refill  Clinical Lists Changes  Medications: Rx of NORVASC 5 MG TABS (AMLODIPINE BESYLATE) Take 1 tab by mouth at bedtime;  #30 x 1;  Signed;  Entered by: Worthy Keeler LPN;  Authorized by: Syliva Overman MD;  Method used: Electronically to [facility]*, [address], [place], [place], [place]  [address], Ph: [phone], Fax: [phone]    Prescriptions: NORVASC 5 MG TABS (AMLODIPINE BESYLATE) Take 1 tab by mouth at bedtime  #30 x 1   Entered by:   Worthy Keeler LPN   Authorized by:   Syliva Overman MD   Signed by:   Worthy Keeler LPN on 13/07/6577   Method used:   Electronically to        [facility]* (retail)       [address]       [place], [place]  [address]       Ph: [phone]       Fax: [phone]   RxID:   [id number]

## 2011-01-08 NOTE — Miscellaneous (Signed)
Summary: refill  Clinical Lists Changes  Medications: Rx of SYMBICORT 80-4.5 MCG/ACT  AERO (BUDESONIDE-FORMOTEROL FUMARATE) Inhale two puffs twice daily;  #1 x 1;  Signed;  Entered by: Worthy Keeler LPN;  Authorized by: Syliva Overman MD;  Method used: Electronically to [facility]*, [address] [place], [place], [place]  [address], Ph: [phone], Fax: [phone]    Prescriptions: SYMBICORT 80-4.5 MCG/ACT  AERO (BUDESONIDE-FORMOTEROL FUMARATE) Inhale two puffs twice daily  #1 x 1   Entered by:   Worthy Keeler LPN   Authorized by:   Syliva Overman MD   Signed by:   Worthy Keeler LPN on 05/39/7673   Method used:   Electronically to        [facility]* (retail)       [address]       [place], [place]  [address]       Ph: [phone]       Fax: [phone]   RxID:   [id number]

## 2011-01-08 NOTE — Miscellaneous (Signed)
Summary: REFILL  Clinical Lists Changes  Medications: Rx of NORVASC 5 MG TABS (AMLODIPINE BESYLATE) Take 1 tab by mouth at bedtime;  #30 x 3;  Signed;  Entered by: Worthy Keeler LPN;  Authorized by: Syliva Overman MD;  Method used: Electronically to [facility]*, [address], [place], [place], [place]  [address], Ph: [phone], Fax: [phone]    Prescriptions: NORVASC 5 MG TABS (AMLODIPINE BESYLATE) Take 1 tab by mouth at bedtime  #30 x 3   Entered by:   Worthy Keeler LPN   Authorized by:   Syliva Overman MD   Signed by:   Worthy Keeler LPN on 95/28/4132   Method used:   Electronically to        [facility]* (retail)       [address]       [place], [place]  [address]       Ph: [phone]       Fax: [phone]   RxID:   [id number]

## 2011-01-08 NOTE — Miscellaneous (Signed)
Summary: refill  Clinical Lists Changes  Medications: Rx of SYMBICORT 80-4.5 MCG/ACT  AERO (BUDESONIDE-FORMOTEROL FUMARATE) Inhale two puffs twice daily;  #1 x 1;  Signed;  Entered by: Worthy Keeler LPN;  Authorized by: Syliva Overman MD;  Method used: Electronically to [facility]*, [address], [place], [place], [place]  [address], Ph: [phone], Fax: [phone]    Prescriptions: SYMBICORT 80-4.5 MCG/ACT  AERO (BUDESONIDE-FORMOTEROL FUMARATE) Inhale two puffs twice daily  #1 x 1   Entered by:   Worthy Keeler LPN   Authorized by:   Syliva Overman MD   Signed by:   Worthy Keeler LPN on 46/96/2952   Method used:   Electronically to        [facility]* (retail)       [address]       [place], [place]  [address]       Ph: [phone]       Fax: [phone]   RxID:   [id number]

## 2011-01-08 NOTE — Progress Notes (Signed)
  Phone Note Call from Patient   Summary of Call: Lipitor 40 mg  not covered. OK to change to Simvastatin lovastatin or pravastatin? CA Simvastatin 10mg  not covered. Change or PA Initial call taken by: Everitt Amber LPN,  March 08, 2010 1:10 PM  Follow-up for Phone Call        change to simvastatin 40mg  .qhdstab #30 refill 4 let pt and pharmacy know pls Follow-up by: Syliva Overman MD,  March 08, 2010 5:07 PM  Additional Follow-up for Phone Call Additional follow up Details #1::        Rx Called In Additional Follow-up by: Adella Hare LPN,  March 08, 2010 5:26 PM    New/Updated Medications: SIMVASTATIN 40 MG TABS (SIMVASTATIN) one tab by mouth at bedtime   discontinue lipitor Prescriptions: SIMVASTATIN 40 MG TABS (SIMVASTATIN) one tab by mouth at bedtime   discontinue lipitor  #30 x 4   Entered by:   Adella Hare LPN   Authorized by:   Syliva Overman MD   Signed by:   Adella Hare LPN on 16/09/9603   Method used:   Electronically to        [facility]* (retail)       [address] [place], [place]  [address]       Ph: [phone]       Fax: [phone]   RxID:   [id number]

## 2011-01-08 NOTE — Assessment & Plan Note (Signed)
Vital Signs:  Patient Profile:   60 Years Old Female Height:     66 inches Weight:      216.9 pounds BMI:     35.14 Pulse rate:   68 / minute Resp:     16 per minute BP sitting:   128 / 84  (left arm)  Pt. in pain?   yes    Location:   right ear    Intensity:   5    Type:       sharp  Vitals Entered By: Calvert Cantor (July 12, 2008 11:18 AM)                  Chief Complaint:  pt c/o right ear pain .  History of Present Illness: Jansen continues to experience R ear pain and fullness, and c/o progressive hearing loss. She d enies drainage from the ear. She denies fever or chills. She c/o increased post nasal drainage and productive cough in the past 2 weeks.     Prior Medications Reviewed Using: Medication Bottles  Prior Medication List:  CETIRIZINE HCL 10 MG  TABS (CETIRIZINE HCL) Take 1 tablet by mouth once a day OSCAL 500/200 D-3 500-200 MG-UNIT  TABS (CALCIUM-VITAMIN D) Take 1 tablet by mouth three times a day LOTREL 10-20 MG  CAPS (AMLODIPINE BESY-BENAZEPRIL HCL) Take 1 tablet by mouth once a day LIPITOR 40 MG  TABS (ATORVASTATIN CALCIUM) Take 1 tab by mouth at bedtime HYDROCHLOROTHIAZIDE 25 MG  TABS (HYDROCHLOROTHIAZIDE) Take 1 tablet by mouth once a day ADULT ASPIRIN LOW STRENGTH 81 MG  TBDP (ASPIRIN) Take 1 tablet by mouth once a day BENICAR 20 MG  TABS (OLMESARTAN MEDOXOMIL) Take 1 tablet by mouth once a day PATANOL 0.1 %  SOLN (OLOPATADINE HCL) One drop each eye twice daily SYMBICORT 80-4.5 MCG/ACT  AERO (BUDESONIDE-FORMOTEROL FUMARATE) Inhale two puffs twice daily XOPENEX HFA 45 MCG/ACT  AERO (LEVALBUTEROL TARTRATE) Inhale one puff once daily as needed   Updated Prior Medication List: CETIRIZINE HCL 10 MG  TABS (CETIRIZINE HCL) Take 1 tablet by mouth once a day OSCAL 500/200 D-3 500-200 MG-UNIT  TABS (CALCIUM-VITAMIN D) Take 1 tablet by mouth three times a day LIPITOR 40 MG  TABS (ATORVASTATIN CALCIUM) Take 1 tab by mouth at bedtime ADULT ASPIRIN LOW STRENGTH  81 MG  TBDP (ASPIRIN) Take 1 tablet by mouth once a day PATANOL 0.1 %  SOLN (OLOPATADINE HCL) One drop each eye twice daily SYMBICORT 80-4.5 MCG/ACT  AERO (BUDESONIDE-FORMOTEROL FUMARATE) Inhale two puffs twice daily XOPENEX HFA 45 MCG/ACT  AERO (LEVALBUTEROL TARTRATE) Inhale one puff once daily as needed SINGULAIR 10 MG  TABS (MONTELUKAST SODIUM) one tab by mouth once daily EQ IBUPROFEN 200 MG  TABS (IBUPROFEN) take two tab by mouth once daily as needed BENICAR HCT 40-25 MG  TABS (OLMESARTAN MEDOXOMIL-HCTZ) one tab by mouth once daily ARTHROTEC 75 75-200 MG-MCG  TABS (DICLOFENAC-MISOPROSTOL) one tab by mouth two times a day  Current Allergies: No known allergies      Review of Systems  CV      Denies chest pain or discomfort, lightheadness, palpitations, shortness of breath with exertion, and swelling of feet.  GI      Denies abdominal pain, bloody stools, constipation, and diarrhea.  GU      Denies discharge, urinary frequency, and urinary hesitancy.  MS      Complains of joint pain and stiffness.      Patient has DJD spine with disc disease,  and benefits from intermittent antinflammatory injections  Psych      Denies anxiety, depression, suicidal thoughts/plans, and thoughts of violence.  Allergy      Denies hives or rash, itching eyes, and sneezing.   Physical Exam  General:     overweight-appearing.   Head:     Normocephalic and atraumatic without obvious abnormalities. No apparent alopecia or balding. Eyes:     vision grossly intact.   Ears:     R ight TM dull with poor light reflex and mild erythema. Left TM dull. Nose:     External nasal examination shows no deformity or inflammation. Nasal mucosa are pink and moist without lesions or exudates. Mouth:     pharynx pink and moist and poor dentition.   Neck:     supple.   Lungs:     Decreased air entry with bilateral crackles. Heart:     Normal rate and regular rhythm. S1 and S2 normal without gallop,  murmur, click, rub or other extra sounds. Abdomen:     soft and non-tender.   Msk:     Decrease RONM thoracolumbar spine Neurologic:     alert & oriented X3.      Impression & Recommendations:  Problem # 1:  HYPERTENSION (ICD-401.9) Assessment: Unchanged  The following medications were removed from the medication list:    Lotrel 10-20 Mg Caps (Amlodipine besy-benazepril hcl) .Marland Kitchen... Take 1 tablet by mouth once a day    Hydrochlorothiazide 25 Mg Tabs (Hydrochlorothiazide) .Marland Kitchen... Take 1 tablet by mouth once a day    Benicar 20 Mg Tabs (Olmesartan medoxomil) .Marland Kitchen... Take 1 tablet by mouth once a day  Her updated medication list for this problem includes:    Benicar Hct 40-25 Mg Tabs (Olmesartan medoxomil-hctz) ..... 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 Take 1 tablet by mouth three times a day   Problem # 5:  BACK PAIN (ICD-724.5) Assessment: Deteriorated  Her updated medication list for this problem includes:    Adult Aspirin Low Strength 81 Mg Tbdp (Aspirin) .Marland Kitchen... Take 1 tablet by mouth once a day    Eq Ibuprofen 200 Mg Tabs (Ibuprofen) .Marland Kitchen... Take two tab by mouth once daily as needed    Arthrotec 75 75-200 Mg-mcg Tabs (Diclofenac-misoprostol) ..... One tab by mouth two times a day Patient given toradol and depomedrol.   Complete Medication List: 1)  Cetirizine Hcl 10 Mg Tabs (Cetirizine hcl) .... Take 1 tablet by mouth once a day 2)  Oscal 500/200 D-3 500-200 Mg-unit Tabs (Calcium-vitamin d) .... Take 1 tablet by mouth three times a day 3)  Lipitor 40 Mg Tabs (Atorvastatin calcium) .... Take 1 tab by mouth at  bedtime 4)  Adult Aspirin Low Strength 81 Mg Tbdp (Aspirin) .... Take 1 tablet by mouth once a day 5)  Patanol 0.1 % Soln (Olopatadine hcl) .... One drop each eye twice daily 6)  Symbicort 80-4.5 Mcg/act Aero (Budesonide-formoterol fumarate) .... Inhale two puffs twice daily 7)  Xopenex Hfa 45 Mcg/act Aero (Levalbuterol tartrate) .... Inhale one puff once daily as needed  8)  Singulair 10 Mg Tabs (Montelukast sodium) .... One tab by mouth once daily 9)  Eq Ibuprofen 200 Mg Tabs (Ibuprofen) .... Take two tab by mouth once daily as needed 10)  Benicar Hct 40-25 Mg Tabs (Olmesartan medoxomil-hctz) .... One tab by mouth once daily 11)  Arthrotec 75 75-200 Mg-mcg Tabs (Diclofenac-misoprostol) .... One tab by mouth two times a day 12)  Veetids 500 Mg Tabs (Penicillin v potassium) .... Take 1 tablet by mouth three times a day  Other Orders: Depo- Medrol 80mg  (J1040) Ketorolac-Toradol 15mg  (Z6109) Admin of Therapeutic Inj  intramuscular or subcutaneous ([address])   Patient Instructions: 1)  Please schedule a follow-up appointment in 2 months. 2)  Check your Blood Pressure regularly. If it is above150/95  you should make an appointment. 3)  It is important that you exercise regularly at least 20 minutes 5 times a week. If you develop chest pain, have severe difficulty breathing, or feel very tired , stop exercising immediately and seek medical attention. 4)  You need to lose weight. Consider a lower calorie diet and regular exercise. 5)  YOU ARE GETTING A TORADOL INJECTION FOR KNEEE AND BACK PAIN. 6)  YOU WILL BE PRESCRIBED ANTIBIOTICS FOR YOUR EAR AND WILl BE REFERRD TO AN ENT SPECIALIST.    Prescriptions: VEETIDS 500 MG  TABS (PENICILLIN V POTASSIUM) Take 1 tablet by mouth three times a day  #30 x 0   Entered and Authorized by:   Syliva Overman MD   Signed by:   Syliva Overman MD on 07/12/2008   Method used:   Electronically sent to ...       [facility]*       [address]       [place], [place]  [address]       Ph: [phone]       Fax: [phone]   RxID:   [id number]  ]  Medication Administration  Injection # 1:    Medication: Depo- Medrol 80mg     Diagnosis: RHEUMATOID ARTHRITIS (ICD-714.0)    Route: IM    Site: RUOQ gluteus    Exp Date: 7/10    Lot #: [id number]    Mfr: [email or web address]    Patient tolerated injection without complications    Given by: Worthy Keeler LPN (July 12, 2008 12:08 PM)  Injection # 2:    Medication: Ketorolac-Toradol 15mg     Diagnosis: RHEUMATOID ARTHRITIS (ICD-714.0)    Route: IM    Site: LUOQ gluteus    Exp Date: 3/11    Lot #: [id number]    Mfr: [email or web address]    Patient tolerated injection without complications    Given by: Worthy Keeler LPN (July 12, 2008 12:09 PM)  Orders Added: 1)  Depo- Medrol 80mg  [J1040] 2)  Ketorolac-Toradol 15mg  [J1885] 3)  Admin of Therapeutic Inj  intramuscular or subcutaneous [59563]

## 2011-01-08 NOTE — Medication Information (Signed)
Summary: Tax adviser   Imported By: Lind Guest 10/19/2008 08:00:45  _____________________________________________________________________  External Attachment:    Type:   Image     Comment:   External Document

## 2011-01-08 NOTE — Assessment & Plan Note (Signed)
Summary: FOLLOW UP   Vital Signs:  Patient profile:   60 year old female Menstrual status:  hysterectomy Height:      66 inches (167.64 cm) Weight:      222.06 pounds (100.94 kg) BMI:     35.97 O2 Sat:      98 % Pulse rate:   65 / minute Pulse rhythm:   regular Resp:     16 per minute BP sitting:   150 / 90  (left arm) Cuff size:   xlg  Vitals Entered By: Everitt Amber (May 16, 2009 9:00 AM)  Nutrition Counseling: Patient's BMI is greater than 25 and therefore counseled on weight management options.  History of Present Illness: Patient reports doing well.  Denies any recent fever or chills.  Denies any appetite change or change in bowel movements. Patient denies depression, anxiety or insomnia. she has a major complaint of increased and uncontrolled back and hip pain and is requesting injections today.she also reports increased post nasal drainage with a tickle in the back of her throat.  Current Medications (verified): 1)  Cetirizine Hcl 10 Mg  Tabs (Cetirizine Hcl) .... Take 1 Tablet By Mouth Once A Day 2)  Oscal 500/200 D-3 500-200 Mg-Unit  Tabs (Calcium-Vitamin D) .... Take 1 Tablet By Mouth Three Times A Day 3)  Lipitor 40 Mg  Tabs (Atorvastatin Calcium) .... Take 1 Tab By Mouth At Bedtime 4)  Adult Aspirin Low Strength 81 Mg  Tbdp (Aspirin) .... Take 1 Tablet By Mouth Once A Day 5)  Patanol 0.1 %  Soln (Olopatadine Hcl) .... One Drop Each Eye Twice Daily 6)  Symbicort 80-4.5 Mcg/act  Aero (Budesonide-Formoterol Fumarate) .... Inhale Two Puffs Twice Daily 7)  Proventil Hfa 108 (90 Base) Mcg/act Aers (Albuterol Sulfate) .... Take 2 Puffs Every 6 Hours As Needed 8)  Singulair 10 Mg  Tabs (Montelukast Sodium) .... One Tab By Mouth Once Daily 9)  Eq Ibuprofen 200 Mg  Tabs (Ibuprofen) .... Take Two Tab By Mouth Once Daily As Needed 10)  Arthrotec 75 75-200 Mg-Mcg Tabs (Diclofenac-Misoprostol) .... Take 1 Tablet By Mouth Two Times A Day 11)  Lotensin Hct 20-12.5 Mg Tabs  (Benazepril-Hydrochlorothiazide) .... Two Tabs By Mouth Qd 12)  Norvasc 5 Mg Tabs (Amlodipine Besylate) .... Take 1 Tab By Mouth At Bedtime 13)  Lipitor 20 Mg Tabs (Atorvastatin Calcium) .... Two Tabs By Mouth Qhs  Allergies (verified): No Known Drug Allergies  Review of Systems      See HPI General:  Complains of fatigue; denies chills and fever. ENT:  Denies earache, hoarseness, nasal congestion, sinus pressure, and sore throat. CV:  Denies chest pain or discomfort, palpitations, shortness of breath with exertion, and swelling of feet. Resp:  Denies cough, sputum productive, and wheezing. GI:  Denies abdominal pain, constipation, diarrhea, nausea, and vomiting. GU:  Complains of dysuria and urinary frequency; malodoros for 1 week . MS:  Complains of joint pain, low back pain, mid back pain, and stiffness; r hippain wants a shots. Derm:  Denies itching, lesion(s), and rash. Neuro:  Complains of visual disturbances; experiences creepy crawly things over her body sometimes.. Endo:  Denies cold intolerance, excessive hunger, excessive thirst, excessive urination, heat intolerance, polyuria, and weight change. Allergy:  Complains of seasonal allergies; mild to moderate.  Physical Exam  General:  alert, well-hydrated, and overweight-appearing. HEENT: No facial asymmetry,  EOMI, No sinus tenderness, TM's Clear, oropharynx  pink and moist.   Chest: Clear to auscultation bilaterally.  CVS: S1, S2, No murmurs, No S3.   Abd: Soft, Nontender.  ZO:XWRUEAVWU ROM spine, hips, shoulders and knees.  Ext: No edema.   CNS: marked hearing and vision loss, power tone and sensation normal throughout.   Skin: Intact, no visible lesions or rashes.  Psych: Good eye contact, normal affect.  Memory intact, not anxious or depressed appearing.      Impression & Recommendations:  Problem # 1:  HIP PAIN, RIGHT (ICD-719.45) Assessment Deteriorated  Her updated medication list for this problem includes:     Adult Aspirin Low Strength 81 Mg Tbdp (Aspirin) .Marland Kitchen... Take 1 tablet by mouth once a day    Eq Ibuprofen 200 Mg Tabs (Ibuprofen) .Marland Kitchen... Take two tab by mouth once daily as needed    Arthrotec 75 75-200 Mg-mcg Tabs (Diclofenac-misoprostol) .Marland Kitchen... Take 1 tablet by mouth two times a day  Discussed use of medications, application of heat or cold, and exercises.   Problem # 2:  HYPERTENSION (ICD-401.9) Assessment: Deteriorated  Her updated medication list for this problem includes:    Lotensin Hct 20-12.5 Mg Tabs (Benazepril-hydrochlorothiazide) .Marland Kitchen..Marland Kitchen Two tabs by mouth qd    Norvasc 5 Mg Tabs (Amlodipine besylate) .Marland Kitchen... Take 1 tab by mouth at bedtime  BP today: 150/90, pt has not takien her meds this morning. Prior BP: 126/86 (02/13/2009)  Labs Reviewed: K+: 4.1 (12/28/2008) Creat: : 0.70 (12/28/2008)   Chol: 162 (12/28/2008)   HDL: 56 (12/28/2008)   LDL: 85 (12/28/2008)   TG: 104 (12/28/2008)  Problem # 3:  OBESITY (ICD-278.00) Assessment: Unchanged  Ht: 66 (05/16/2009)   Wt: 222.06 (05/16/2009)   BMI: 35.97 (05/16/2009)  Problem # 4:  MIXED HEARING LOSS BILATERAL (ICD-389.22) Assessment: Deteriorated  Problem # 5:  ACUTE CYSTITIS (ICD-595.0)  Orders: UA Dipstick W/ Micro (manual) (98119), negative  Encouraged to push clear liquids, get enough rest, and take acetaminophen as needed. To be seen in 10 days if no improvement, sooner if worse.  Complete Medication List: 1)  Cetirizine Hcl 10 Mg Tabs (Cetirizine hcl) .... Take 1 tablet by mouth once a day 2)  Oscal 500/200 D-3 500-200 Mg-unit Tabs (Calcium-vitamin d) .... Take 1 tablet by mouth three times a day 3)  Lipitor 40 Mg Tabs (Atorvastatin calcium) .... Take 1 tab by mouth at bedtime 4)  Adult Aspirin Low Strength 81 Mg Tbdp (Aspirin) .... Take 1 tablet by mouth once a day 5)  Patanol 0.1 % Soln (Olopatadine hcl) .... One drop each eye twice daily 6)  Symbicort 80-4.5 Mcg/act Aero (Budesonide-formoterol fumarate) .... Inhale  two puffs twice daily 7)  Proventil Hfa 108 (90 Base) Mcg/act Aers (Albuterol sulfate) .... Take 2 puffs every 6 hours as needed 8)  Singulair 10 Mg Tabs (Montelukast sodium) .... One tab by mouth once daily 9)  Eq Ibuprofen 200 Mg Tabs (Ibuprofen) .... Take two tab by mouth once daily as needed 10)  Arthrotec 75 75-200 Mg-mcg Tabs (Diclofenac-misoprostol) .... Take 1 tablet by mouth two times a day 11)  Lotensin Hct 20-12.5 Mg Tabs (Benazepril-hydrochlorothiazide) .... Two tabs by mouth qd 12)  Norvasc 5 Mg Tabs (Amlodipine besylate) .... Take 1 tab by mouth at bedtime 13)  Lipitor 20 Mg Tabs (Atorvastatin calcium) .... Two tabs by mouth qhs  Other Orders: T-Basic Metabolic Panel [phone]) T-Hepatic Function [phone]) T-Lipid Profile [phone]) T-TSH [phone]) Radiology Referral (Radiology) Depo- Medrol 80mg  (J1040) Ketorolac-Toradol 15mg  (G6440) Admin of Therapeutic Inj  intramuscular or subcutaneous (34742) Gastroenterology Referral (GI)  Patient Instructions: 1)  Please schedule a follow-up appointment in  12 weeks 2)  Your mamo is due June 12 or after , we will schedule. 3)  We will refer you for a colonscopy, not due at this time. 4)  Your BP is  high todauy, pls take your morning meds at next visit.  5)  BMP prior to visit, ICD-9: 6)  Hepatic Panel prior to visit, ICD-9:   fasting in 3 months 7)  Injections today.  8)  Lipid Panel prior to visit, ICD-9: 9)  TSH prior to visit, ICD-9: Prescriptions: NORVASC 5 MG TABS (AMLODIPINE BESYLATE) Take 1 tab by mouth at bedtime  #30 x 5   Entered by:   Worthy Keeler LPN   Authorized by:   Syliva Overman MD   Signed by:   Worthy Keeler LPN on 42/59/5638   Method used:   Electronically to        [facility]* (retail)       [address]       [place], [place]  [address]       Ph: [phone]       Fax: [phone]   RxID:   [id number] SINGULAIR 10 MG  TABS (MONTELUKAST  SODIUM) one tab by mouth once daily  #30 x 5   Entered by:   Worthy Keeler LPN   Authorized by:   Syliva Overman MD   Signed by:   Worthy Keeler LPN on 06/01/7627   Method used:   Electronically to        [facility]* (retail)       [address]       [place], [place]  [address]       Ph: [phone]       Fax: [phone]   RxID:   [id number] LIPITOR 40 MG  TABS (ATORVASTATIN CALCIUM) Take 1 tab by mouth at bedtime  #30 x 5   Entered by:   Worthy Keeler LPN   Authorized by:   Syliva Overman MD   Signed by:   Worthy Keeler LPN on 69/67/8938   Method used:   Electronically to        [facility]* (retail)       [address] [place], [place]  [address]       Ph: [phone]       Fax: [phone]   RxID:   [id number] CETIRIZINE HCL 10 MG  TABS (CETIRIZINE HCL) Take 1 tablet by mouth once a day  #30 x 5   Entered by:   Worthy Keeler LPN   Authorized by:   Syliva Overman MD   Signed by:   Worthy Keeler LPN on 26/71/2458   Method used:   Electronically to        [facility]* (retail)       [address] [place], [place]  [address]       Ph: [phone]       Fax: [phone]   RxID:   [id number]    Preventive Care Screening  Colonoscopy:    Date:  01/14/2001    Results:  normal    Medication Administration  Injection # 1:    Medication: Depo- Medrol 80mg   Diagnosis: HIP PAIN, RIGHT (ICD-719.45)    Route: IM    Site: RUOQ gluteus    Exp Date: 1/13    Lot #: [id number]    Mfr: Pharmacia    Patient tolerated injection without complications    Given by: Worthy Keeler LPN (May 17, 5175 10:13 AM)  Injection # 2:    Medication: Ketorolac-Toradol 15mg     Diagnosis: HIP PAIN, RIGHT (ICD-719.45)    Route: IM    Site: LUOQ gluteus    Exp Date: 02/07/2011    Lot #: [id number]    Mfr: [email or web address]    Comments: toradol 60mg  given    Patient  tolerated injection without complications    Given by: Worthy Keeler LPN (May 16, 625 10:14 AM)  Orders Added: 1)  Est. Patient Level IV [94854] 2)  T-Basic Metabolic Panel [62703-50093] 3)  T-Hepatic Function [81829-93716] 4)  T-Lipid Profile [96789-38101] 5)  T-TSH [75102-58527] 6)  Radiology Referral [Radiology] 7)  Depo- Medrol 80mg  [J1040] 8)  Ketorolac-Toradol 15mg  [J1885] 9)  Admin of Therapeutic Inj  intramuscular or subcutaneous [96372] 10)  UA Dipstick W/ Micro (manual) [81000] 11)  Gastroenterology Referral [GI]    Preventive Care Screening  Colonoscopy:    Date:  01/14/2001    Results:  normal   Laboratory Results   Urine Tests  Date/Time Received: 05/16/09 Date/Time Reported: 05/16/09  Routine Urinalysis   Color: yellow Appearance: Clear Glucose: negative   (Normal Range: Negative) Bilirubin: small   (Normal Range: Negative) Ketone: trace (5)   (Normal Range: Negative) Spec. Gravity: 1.025   (Normal Range: 1.003-1.035) Blood: negative   (Normal Range: Negative) pH: 5.0   (Normal Range: 5.0-8.0) Protein: negative   (Normal Range: Negative) Urobilinogen: 0.2   (Normal Range: 0-1) Nitrite: negative   (Normal Range: Negative) Leukocyte Esterace: negative   (Normal Range: Negative)

## 2011-01-08 NOTE — Miscellaneous (Signed)
Summary: refill  Clinical Lists Changes  Medications: Rx of ARTHROTEC 75 75-200 MG-MCG  TABS (DICLOFENAC-MISOPROSTOL) one tab by mouth two times a day;  #60 x 1;  Signed;  Entered by: Worthy Keeler LPN;  Authorized by: Syliva Overman MD;  Method used: Electronic    Prescriptions: ARTHROTEC 75 75-200 MG-MCG  TABS (DICLOFENAC-MISOPROSTOL) one tab by mouth two times a day  #60 x 1   Entered by:   Worthy Keeler LPN   Authorized by:   Syliva Overman MD   Signed by:   Worthy Keeler LPN on 95/28/4132   Method used:   Electronically sent to ...       [facility]*       [address]       [place], [place]  [address]       Ph: [phone]       Fax: [phone]   RxID:   [id number]

## 2011-01-10 NOTE — Progress Notes (Signed)
  Phone Note Other Incoming   Caller: Adult nurse of Call: arthrotec needs pA.  pls advise pt of this and i am entering historicall a trial of a preferred  alternative , nabumetone, pls fax to pharmacy with note of substitution after you spk with the pt Initial call taken by: Syliva Overman MD,  November 17, 2010 12:25 PM  Follow-up for Phone Call        patient aware Follow-up by: Adella Hare LPN,  November 19, 2010 9:42 AM    New/Updated Medications: NABUMETONE 750 MG TABS (NABUMETONE) Take 1 tablet by mouth two times a day Prescriptions: NABUMETONE 750 MG TABS (NABUMETONE) Take 1 tablet by mouth two times a day  #60 x 4   Entered by:   Adella Hare LPN   Authorized by:   Syliva Overman MD   Signed by:   Adella Hare LPN on 16/09/9603   Method used:   Electronically to        [facility]* (retail)       [address]       [place], [place]  [address]       Ph: [phone]       Fax: [phone]   RxID:   [id number] NABUMETONE 750 MG TABS (NABUMETONE) Take 1 tablet by mouth two times a day  #60 x 4   Entered and Authorized by:   Syliva Overman MD   Signed by:   Syliva Overman MD on 11/17/2010   Method used:   Historical   RxID:   [id number]

## 2011-01-10 NOTE — Assessment & Plan Note (Signed)
Summary: office visit   Vital Signs:  Patient profile:   60 year old female Menstrual status:  hysterectomy Height:      66 inches Weight:      221.25 pounds BMI:     35.84 O2 Sat:      94 % Pulse rate:   72 / minute Pulse rhythm:   regular Resp:     16 per minute BP sitting:   130 / 80  (left arm) Cuff size:   xl  Vitals Entered By: Everitt Amber LPN (December 24, 2010 10:14 AM)  Nutrition Counseling: Patient's BMI is greater than 25 and therefore counseled on weight management options. CC: Follow up chronic problems, nasal congestion, thick and clear mucus   CC:  Follow up chronic problems, nasal congestion, and thick and clear mucus.  History of Present Illness: Reports  that she had been doing fairly well up until 3 weeks ago. Denies recent fever or chills.  Denies chest congestion, or cough productive of sputum. Denies chest pain, palpitations, PND, orthopnea or leg swelling. Denies abdominal pain, nausea, vomitting, diarrhea or constipation. Denies change in bowel movements or bloody stool. Denies dysuria , frequency, incontinence or hesitancy.  Denies headaches, vertigo, seizures. Denies depression, anxiety or insomnia. Denies  rash, lesions, or itch.     Current Medications (verified): 1)  Oscal 500/200 D-3 500-200 Mg-Unit  Tabs (Calcium-Vitamin D) .... Take 1 Tablet By Mouth Three Times A Day 2)  Adult Aspirin Low Strength 81 Mg  Tbdp (Aspirin) .... Take 1 Tablet By Mouth Once A Day 3)  Freshkote 2.7-2 % Soln (Polyvin Alc-Povidon-Dimethylam) .... One Drop in Both Eyes Three Times A Day 4)  Proventil Hfa 108 (90 Base) Mcg/act Aers (Albuterol Sulfate) .... Take 2 Puffs Every 6 Hours As Needed 5)  Lotensin Hct 20-12.5 Mg Tabs (Benazepril-Hydrochlorothiazide) .... Two Tabs By Mouth Qd 6)  Norvasc 5 Mg Tabs (Amlodipine Besylate) .... Take 1 Tab By Mouth At Bedtime 7)  Qvar 40 Mcg/act Aers (Beclomethasone Dipropionate) .... 2 Puffs Twice Daily 8)  Lovastatin 40 Mg  Tabs (Lovastatin) .... Take 1 Tab By Mouth At Bedtime Discontinue Simvastatin Deliver 9)  Zyrtec Hives Relief 10 Mg Tabs (Cetirizine Hcl) .... Take 1 Tablet By Mouth Once A Day 10)  Nabumetone 750 Mg Tabs (Nabumetone) .... Take 1 Tablet By Mouth Two Times A Day  Allergies (verified): No Known Drug Allergies  Review of Systems      See HPI General:  Complains of fatigue. Eyes:  Complains of vision loss-both eyes. ENT:  Complains of hoarseness, nasal congestion, postnasal drainage, and sinus pressure; 3 week h/o green drainage with sinus pressure. Resp:  Denies cough and sputum productive. MS:  Complains of joint pain and stiffness; right hip pain and stiffness x 2 weeks. Endo:  Denies cold intolerance, excessive hunger, excessive thirst, excessive urination, and heat intolerance. Heme:  Denies abnormal bruising, bleeding, and enlarge lymph nodes. Allergy:  Complains of seasonal allergies; increased symptoms.  Physical Exam  General:  Well-developed,well-nourished,in no acute distress; alert,appropriate and cooperative throughout examination HEENT: No facial asymmetry,  EOMI, maxillary  sinus tenderness, TM's Clear, oropharynx  pink and moist.   Chest: Clear to auscultation bilaterally.  CVS: S1, S2, No murmurs, No S3.   Abd: Soft, Nontender.  MS: decreased  ROM spine,and right  hips, adequate in shoulders and knees.  Ext: No edema.   CNS: CN 2-12 intact, power tone and sensation normal throughout.   Skin: Intact, no visible lesions or  rashes.  Psych: Good eye contact, normal affect.  Memory intact, not anxious or depressed appearing.    Impression & Recommendations:  Problem # 1:  ACUTE SINUSITIS, UNSPECIFIED (ICD-461.9) Assessment Comment Only  The following medications were removed from the medication list:    Penicillin V Potassium 500 Mg Tabs (Penicillin v potassium) .Marland Kitchen... Take 1 tablet by mouth three times a day Her updated medication list for this problem includes:     Septra Ds 800-160 Mg Tabs (Sulfamethoxazole-trimethoprim) .Marland Kitchen... Take 1 tablet by mouth two times a day  Problem # 2:  HIP PAIN, RIGHT (ICD-719.45) Assessment: Deteriorated  Problem # 3:  OBESITY (ICD-278.00) Assessment: Deteriorated  Ht: 66 (12/24/2010)   Wt: 221.25 (12/24/2010)   BMI: 35.84 (12/24/2010) therapeutic lifestyle change discussed and encouraged  Problem # 4:  HYPERTENSION (ICD-401.9) Assessment: Unchanged  Her updated medication list for this problem includes:    Lotensin Hct 20-12.5 Mg Tabs (Benazepril-hydrochlorothiazide) .Marland Kitchen..Marland Kitchen Two tabs by mouth qd    Norvasc 5 Mg Tabs (Amlodipine besylate) .Marland Kitchen... Take 1 tab by mouth at bedtime  Orders: T-Basic Metabolic Panel [phone])  BP today: 130/80 Prior BP: 130/82 (08/21/2010)  Labs Reviewed: K+: 4.4 (12/13/2010) Creat: : 0.73 (12/13/2010)   Chol: 204 (12/13/2010)   HDL: 63 (12/13/2010)   LDL: 121 (12/13/2010)   TG: 102 (12/13/2010)  Problem # 5:  HYPERLIPIDEMIA (ICD-272.4) Assessment: Comment Only  Her updated medication list for this problem includes:    Lovastatin 40 Mg Tabs (Lovastatin) .Marland Kitchen... Take 1 tab by mouth at bedtime discontinue simvastatin deliver  Orders: T-Hepatic Function [phone]) T-Lipid Profile [phone]) Low fat dietdiscussed and encouraged  Labs Reviewed: SGOT: 27 (12/13/2010)   SGPT: 36 (12/13/2010)   HDL:63 (12/13/2010), 59 (06/20/2010)  LDL:121 (12/13/2010), 96 (57/84/6962)  Chol:204 (12/13/2010), 176 (06/20/2010)  Trig:102 (12/13/2010), 105 (06/20/2010)  Complete Medication List: 1)  Oscal 500/200 D-3 500-200 Mg-unit Tabs (Calcium-vitamin d) .... Take 1 tablet by mouth three times a day 2)  Adult Aspirin Low Strength 81 Mg Tbdp (Aspirin) .... Take 1 tablet by mouth once a day 3)  Freshkote 2.7-2 % Soln (Polyvin alc-povidon-dimethylam) .... One drop in both eyes three times a day 4)  Proventil Hfa 108 (90 Base) Mcg/act Aers (Albuterol sulfate) .... Take 2 puffs every 6 hours as  needed 5)  Lotensin Hct 20-12.5 Mg Tabs (Benazepril-hydrochlorothiazide) .... Two tabs by mouth qd 6)  Norvasc 5 Mg Tabs (Amlodipine besylate) .... Take 1 tab by mouth at bedtime 7)  Qvar 40 Mcg/act Aers (Beclomethasone dipropionate) .... 2 puffs twice daily 8)  Lovastatin 40 Mg Tabs (Lovastatin) .... Take 1 tab by mouth at bedtime discontinue simvastatin deliver 9)  Zyrtec Hives Relief 10 Mg Tabs (Cetirizine hcl) .... Take 1 tablet by mouth once a day 10)  Nabumetone 750 Mg Tabs (Nabumetone) .... Take 1 tablet by mouth two times a day 11)  Septra Ds 800-160 Mg Tabs (Sulfamethoxazole-trimethoprim) .... Take 1 tablet by mouth two times a day  Other Orders: T- Hemoglobin A1C ([id number]) T-TSH (01027-25366) Ketorolac-Toradol 15mg  (Y4034) Admin of Therapeutic Inj  intramuscular or subcutaneous ([address])  Patient Instructions: 1)  Please schedule a follow-up appointment in 4 months. 2)  It is important that you exercise regularly at least 20 minutes 5 times a week. If you develop chest pain, have severe difficulty breathing, or feel very tired , stop exercising immediately and seek medical attention. 3)  You need to lose weight. Consider a lower calorie diet and regular exercise.  4)  Pls cut  back on sugar, sweets, and fatty foods. 5)  You will get an injection in the office for hip pain, toradol 60mg   6)  Med is sent in for sinusitis, pls take the entire course 7)  BMP prior to visit, ICD-9: 8)  Hepatic Panel prior to visit, ICD-9: 9)  Lipid Panel prior to visit, ICD-9:   fasting in 4 months 10)  HbgA1C prior to visit, ICD-9: 11)  TSH prior to visit, ICD-9: Prescriptions: LOVASTATIN 40 MG TABS (LOVASTATIN) Take 1 tab by mouth at bedtime discontinue simvastatin deliver  #30 x 3   Entered by:   Adella Hare LPN   Authorized by:   Syliva Overman MD   Signed by:   Adella Hare LPN on 04/54/0981   Method used:   Electronically to        [facility]* (retail)       [address] [place], [place]  [address]       Ph: [phone]       Fax: [phone]   RxID:   [id number] NORVASC 5 MG TABS (AMLODIPINE BESYLATE) Take 1 tab by mouth at bedtime  #30 Each x 3   Entered by:   Adella Hare LPN   Authorized by:   Syliva Overman MD   Signed by:   Adella Hare LPN on 36/64/4034   Method used:   Electronically to        [facility]* (retail)       [address] [place], [place]  [address]       Ph: [phone]       Fax: [phone]   RxID:   [id number] LOTENSIN HCT 20-12.5 MG TABS (BENAZEPRIL-HYDROCHLOROTHIAZIDE) two tabs by mouth qd  #60 Each x 3   Entered by:   Adella Hare LPN   Authorized by:   Syliva Overman MD   Signed by:   Adella Hare LPN on 32/20/2542   Method used:   Electronically to        [facility]* (retail)       [address] [place], [place]  [address]       Ph: [phone]       Fax: [phone]   RxID:   [id number] SEPTRA DS 800-160 MG TABS (SULFAMETHOXAZOLE-TRIMETHOPRIM) Take 1 tablet by mouth two times a day  #20 x 0   Entered and Authorized by:   Syliva Overman MD   Signed by:   Syliva Overman MD on 12/24/2010   Method used:   Electronically to        [facility]* (retail)       [address]       [place], [place]  [address]       Ph: [phone]       Fax: [phone]   RxID:   [id number]    Medication Administration  Injection # 1:    Medication: Ketorolac-Toradol 15mg     Diagnosis: HIP PAIN, RIGHT (ICD-719.45)    Route: IM    Site: RUOQ gluteus    Exp Date: 05/09/2012    Lot #: [id number]    Mfr: [email or web address]    Comments: toradol 60mg  given    Patient  tolerated injection without complications    Given by: Adella Hare LPN (December 24, 2010 11:27 AM)  Orders Added: 1)  Est. Patient Level IV [99214] 2)  T-Basic Metabolic Panel [phone] 3)   T-Hepatic Function [80076-22960] 4)  T-Lipid Profile [80061-22930] 5)  T- Hemoglobin A1C [83036-23375] 6)  T-TSH [14782-95621] 7)  Ketorolac-Toradol 15mg  [J1885] 8)  Admin of Therapeutic Inj  intramuscular or subcutaneous [96372]     Medication Administration  Injection # 1:    Medication: Ketorolac-Toradol 15mg     Diagnosis: HIP PAIN, RIGHT (ICD-719.45)    Route: IM    Site: RUOQ gluteus    Exp Date: 05/09/2012    Lot #: [id number]    Mfr: [email or web address]    Comments: toradol 60mg  given    Patient tolerated injection without complications    Given by: Adella Hare LPN (December 24, 2010 11:27 AM)  Orders Added: 1)  Est. Patient Level IV [46962] 2)  T-Basic Metabolic Panel [95284-13244] 3)  T-Hepatic Function [80076-22960] 4)  T-Lipid Profile [80061-22930] 5)  T- Hemoglobin A1C [83036-23375] 6)  T-TSH [01027-25366] 7)  Ketorolac-Toradol 15mg  [J1885] 8)  Admin of Therapeutic Inj  intramuscular or subcutaneous [[address]34]

## 2011-03-01 ENCOUNTER — Other Ambulatory Visit: Payer: Self-pay | Admitting: Family Medicine

## 2011-03-18 ENCOUNTER — Other Ambulatory Visit: Payer: Self-pay | Admitting: Family Medicine

## 2011-03-31 ENCOUNTER — Emergency Department (HOSPITAL_COMMUNITY)
Admission: EM | Admit: 2011-03-31 | Discharge: 2011-03-31 | Disposition: A | Discharge status: Home / Self Care | Payer: Medicaid Other | Attending: Emergency Medicine | Admitting: Emergency Medicine

## 2011-03-31 ENCOUNTER — Emergency Department (HOSPITAL_COMMUNITY): Payer: Medicaid Other

## 2011-03-31 DIAGNOSIS — T465X5A Adverse effect of other antihypertensive drugs, initial encounter: Secondary | ICD-10-CM | POA: Insufficient documentation

## 2011-03-31 DIAGNOSIS — I10 Essential (primary) hypertension: Secondary | ICD-10-CM | POA: Insufficient documentation

## 2011-03-31 DIAGNOSIS — T783XXA Angioneurotic edema, initial encounter: Secondary | ICD-10-CM | POA: Insufficient documentation

## 2011-03-31 DIAGNOSIS — R0609 Other forms of dyspnea: Secondary | ICD-10-CM | POA: Insufficient documentation

## 2011-03-31 DIAGNOSIS — Z79899 Other long term (current) drug therapy: Secondary | ICD-10-CM | POA: Insufficient documentation

## 2011-03-31 DIAGNOSIS — R0989 Other specified symptoms and signs involving the circulatory and respiratory systems: Secondary | ICD-10-CM | POA: Insufficient documentation

## 2011-03-31 DIAGNOSIS — E7800 Pure hypercholesterolemia, unspecified: Secondary | ICD-10-CM | POA: Insufficient documentation

## 2011-03-31 DIAGNOSIS — M129 Arthropathy, unspecified: Secondary | ICD-10-CM | POA: Insufficient documentation

## 2011-03-31 LAB — POCT CARDIAC MARKERS
CKMB, poc: 1.1 ng/mL (ref 1.0–8.0)
Myoglobin, poc: 92.1 ng/mL (ref 12–200)
Troponin i, poc: <0.05 ng/mL (ref 0.00–0.09)

## 2011-03-31 LAB — POCT I-STAT, CHEM 8
BUN: 20 mg/dL (ref 6–23)
Calcium, Ion: 1.15 mmol/L (ref 1.12–1.32)
Chloride: 108 mEq/L (ref 96–112)
Creatinine, Ser: 0.9 mg/dL (ref 0.4–1.2)
Glucose, Bld: 110 mg/dL — ABNORMAL HIGH (ref 70–99)
HCT: 38 % (ref 36.0–46.0)
Hemoglobin: 12.9 g/dL (ref 12.0–15.0)
Potassium: 4.4 mEq/L (ref 3.5–5.1)
Sodium: 142 mEq/L (ref 135–145)
TCO2: 23 mmol/L (ref 0–100)

## 2011-03-31 LAB — CBC
HCT: 36.8 % (ref 36.0–46.0)
Hemoglobin: 12.3 g/dL (ref 12.0–15.0)
MCH: 31.9 pg (ref 26.0–34.0)
MCHC: 33.4 g/dL (ref 30.0–36.0)
MCV: 95.6 fL (ref 78.0–100.0)
Platelets: 255 10*3/uL (ref 150–400)
RBC: 3.85 MIL/uL — ABNORMAL LOW (ref 3.87–5.11)
RDW: 13.9 % (ref 11.5–15.5)
WBC: 9.3 10*3/uL (ref 4.0–10.5)

## 2011-03-31 LAB — DIFFERENTIAL
Basophils Absolute: 0 10*3/uL (ref 0.0–0.1)
Basophils Relative: 0 % (ref 0–1)
Eosinophils Absolute: 0 10*3/uL (ref 0.0–0.7)
Eosinophils Relative: 0 % (ref 0–5)
Lymphocytes Relative: 20 % (ref 12–46)
Lymphs Abs: 1.8 10*3/uL (ref 0.7–4.0)
Monocytes Absolute: 0.6 10*3/uL (ref 0.1–1.0)
Monocytes Relative: 6 % (ref 3–12)
Neutro Abs: 6.9 10*3/uL (ref 1.7–7.7)
Neutrophils Relative %: 74 % (ref 43–77)

## 2011-03-31 LAB — D-DIMER, QUANTITATIVE: D-Dimer, Quant: 4.69 ug/mL-FEU — ABNORMAL HIGH (ref 0.00–0.48)

## 2011-03-31 MED ORDER — IOHEXOL 350 MG/ML SOLN
100.0000 mL | Freq: Once | INTRAVENOUS | Status: AC | PRN
  Administered 2011-03-31: 100 mL via INTRAVENOUS

## 2011-04-01 ENCOUNTER — Telehealth: Payer: Self-pay | Admitting: Family Medicine

## 2011-04-01 NOTE — Telephone Encounter (Signed)
Patient went to the ER yesterday morning, she was giving O2 and 2 breathing treatments.  While she was there they also discussed her breaking out in hives it was worse in March but was last week, and the doctor there said it might be from her BP medication.  She said that her mouth was swollen as well, she (patient) thought maybe it was coming from allergies.  Please advise.

## 2011-04-02 MED ORDER — AMLODIPINE BESYLATE 10 MG PO TABS: 10.0000 mg | ORAL_TABLET | Freq: Every day | ORAL | Status: DC

## 2011-04-02 NOTE — Telephone Encounter (Signed)
Advise pt and pharmacy stop lotensin hctz, and start increased dose of amlodipine to 10mg  one daily, she can take two 5mg  daily till done Send in 30 day supply with 2 refills of the amlodipine 10mg  tab pls , also she needs ov in 5 to 6 weeks for BP re-eval;. Also erx a prednisone 5mg  dose pack x 6 days since she stioll has swelling. Advise if worsens go back to the ED, i do see where she was in the ED

## 2011-04-12 ENCOUNTER — Other Ambulatory Visit: Payer: Self-pay | Admitting: Family Medicine

## 2011-04-12 LAB — HEPATIC FUNCTION PANEL
ALT: 31 U/L (ref 0–35)
AST: 29 U/L (ref 0–37)
Albumin: 4.4 g/dL (ref 3.5–5.2)
Alkaline Phosphatase: 60 U/L (ref 39–117)
Bilirubin, Direct: 0.1 mg/dL (ref 0.0–0.3)
Indirect Bilirubin: 0.4 mg/dL (ref 0.0–0.9)
Total Bilirubin: 0.5 mg/dL (ref 0.3–1.2)
Total Protein: 7.8 g/dL (ref 6.0–8.3)

## 2011-04-12 LAB — LIPID PANEL
Cholesterol: 218 mg/dL — ABNORMAL HIGH (ref 0–200)
HDL: 79 mg/dL (ref >39)
LDL Cholesterol: 118 mg/dL — ABNORMAL HIGH (ref 0–99)
Total CHOL/HDL Ratio: 2.8 Ratio
Triglycerides: 107 mg/dL (ref <150)
VLDL: 21 mg/dL (ref 0–40)

## 2011-04-12 LAB — BASIC METABOLIC PANEL
BUN: 24 mg/dL — ABNORMAL HIGH (ref 6–23)
CO2: 25 mEq/L (ref 19–32)
Calcium: 9.5 mg/dL (ref 8.4–10.5)
Chloride: 101 mEq/L (ref 96–112)
Creat: 0.72 mg/dL (ref 0.40–1.20)
Glucose, Bld: 95 mg/dL (ref 70–99)
Potassium: 3.2 mEq/L — ABNORMAL LOW (ref 3.5–5.3)
Sodium: 140 mEq/L (ref 135–145)

## 2011-04-12 LAB — TSH: TSH: 2.589 u[IU]/mL (ref 0.350–4.500)

## 2011-04-12 LAB — HEMOGLOBIN A1C
Hgb A1c MFr Bld: 6 % — ABNORMAL HIGH (ref <5.7)
Mean Plasma Glucose: 126 mg/dL — ABNORMAL HIGH (ref <117)

## 2011-04-15 MED ORDER — POTASSIUM CHLORIDE 20 MEQ PO PACK: 20.0000 meq | PACK | Freq: Every day | ORAL | Status: DC

## 2011-04-15 NOTE — Progress Notes (Signed)
Addended by: Adella Hare on: 04/15/2011 01:22 PM   Modules accepted: Orders

## 2011-04-25 ENCOUNTER — Encounter: Payer: Self-pay | Admitting: Family Medicine

## 2011-05-01 ENCOUNTER — Encounter: Payer: Self-pay | Admitting: Family Medicine

## 2011-05-01 ENCOUNTER — Ambulatory Visit (INDEPENDENT_AMBULATORY_CARE_PROVIDER_SITE_OTHER): Payer: Medicaid Other | Admitting: Family Medicine

## 2011-05-01 VITALS — BP 150/90 | HR 72 | Resp 16 | Ht 67.0 in | Wt 217.0 lb

## 2011-05-01 DIAGNOSIS — E785 Hyperlipidemia, unspecified: Secondary | ICD-10-CM

## 2011-05-01 DIAGNOSIS — Z1211 Encounter for screening for malignant neoplasm of colon: Secondary | ICD-10-CM

## 2011-05-01 DIAGNOSIS — E669 Obesity, unspecified: Secondary | ICD-10-CM

## 2011-05-01 DIAGNOSIS — I10 Essential (primary) hypertension: Secondary | ICD-10-CM

## 2011-05-01 DIAGNOSIS — M25569 Pain in unspecified knee: Secondary | ICD-10-CM

## 2011-05-01 DIAGNOSIS — M25559 Pain in unspecified hip: Secondary | ICD-10-CM

## 2011-05-01 DIAGNOSIS — M25551 Pain in right hip: Secondary | ICD-10-CM

## 2011-05-01 DIAGNOSIS — J019 Acute sinusitis, unspecified: Secondary | ICD-10-CM

## 2011-05-01 DIAGNOSIS — M25562 Pain in left knee: Secondary | ICD-10-CM

## 2011-05-01 MED ORDER — KETOROLAC TROMETHAMINE 30 MG/ML IJ SOLN
60.0000 mg | Freq: Once | INTRAMUSCULAR | Status: AC
  Administered 2011-05-01: 60 mg via INTRAMUSCULAR

## 2011-05-01 MED ORDER — CETIRIZINE HCL 10 MG PO TABS: 10.0000 mg | ORAL_TABLET | Freq: Every day | ORAL | Status: DC

## 2011-05-01 MED ORDER — PENICILLIN V POTASSIUM 500 MG PO TABS: 500.0000 mg | ORAL_TABLET | Freq: Three times a day (TID) | ORAL | Status: AC

## 2011-05-01 MED ORDER — FLUTICASONE PROPIONATE 50 MCG/ACT NA SUSP: 1.0000 | Freq: Every day | NASAL | Status: DC

## 2011-05-01 MED ORDER — TRIAMTERENE-HCTZ 37.5-25 MG PO CAPS: 1.0000 | ORAL_CAPSULE | Freq: Every day | ORAL | Status: DC

## 2011-05-01 NOTE — Patient Instructions (Addendum)
F/u in 6 weeks. pls schedule your mammogram, due after June 01, 2011.  You will be referred for a colonoscopy, also to dr Romeo Apple about your left knee  It is absolutely necessary for you to cut back on sweets and bread and sweet drinks so you do not become diabetic, now your blood sugars are too high     Additional med for your blood pressure.  Med sent in for sinusitis

## 2011-05-01 NOTE — Progress Notes (Signed)
  Subjective:    Patient ID: [name], female    DOB: 03/01/1951, 60 y.o.   MRN: [id number]  HPI Swelling , crunching and popping of left knee worsening in the past 3 to 4 months Intermittent chills x 1 month, increased nasal congestion with clear drainage, no productive cough. Excessive sneezing and watery eyes also, which is not unexpected fore this season. Colonoscopy is due, she is asymptomatic and has no fam h/o colon ca. Chronic back and hip pain unchanged   Review of Systems Denies recent fever. C.O bilateral ear pressure and fullness Denies chest congestion, productive cough or wheezing. Denies chest pains, palpitations, paroxysmal nocturnal dyspnea, orthopnea and leg swelling Denies abdominal pain, nausea, vomiting,diarrhea or constipation.  Denies rectal bleeding or change in bowel movement. Denies dysuria, frequency, hesitancy or incontinence. Denies headaches, seizure, numbness, or tingling. Denies depression, anxiety or insomnia. Denies skin break down or rash.        Objective:   Physical Exam Patient alert and oriented and in no Cardiopulmonary distress.  HEENT: No facial asymmetry, EOMI, no sinus tenderness, TM's clear, Oropharynx pink and moist.  Neck supple no adenopathy. Erythema and edema of nasal mucosa  Chest: Clear to auscultation bilaterally.  CVS: S1, S2 no murmurs, no S3.  ABD: Soft non tender. Bowel sounds normal.  Ext: No edema  MS: decreased  ROM spine, shoulders, hips and knees.  Skin: Intact, no ulcerations or rash noted.  Psych: Good eye contact, normal affect. Memory intact not anxious or depressed appearing.  CNS: CN 2-12 intact, power, tone and sensation normal throughout.        Assessment & Plan:

## 2011-05-01 NOTE — Assessment & Plan Note (Signed)
Medication compliance addressed. Commitment to regular exercise and healthy  food choices, with portion control discussed. DASH diet and low fat diet discussed and literature offered. Changes in medication made at this visit.  

## 2011-05-02 ENCOUNTER — Telehealth: Payer: Self-pay

## 2011-05-02 DIAGNOSIS — Z139 Encounter for screening, unspecified: Secondary | ICD-10-CM

## 2011-05-02 NOTE — Telephone Encounter (Signed)
Gastroenterology Pre-Procedure Form  Request Date: 05/02/2011,  Requesting Physician: Dr. Lodema Hong     PATIENT INFORMATION:  [name] is a 60 y.o., female (DOB=01/09/1951).  PROCEDURE: Procedure(s) requested: colonoscopy Procedure Reason: screening for colon cancer  PATIENT REVIEW QUESTIONS: The patient reports the following:   1. Diabetes Melitis: no 2. Joint replacements in the past 12 months: no 3. Major health problems in the past 3 months: no 4. Has an artificial valve or MVP:no 5. Has been advised in past to take antibiotics in advance of a procedure like teeth cleaning: no}    MEDICATIONS & ALLERGIES:    Patient reports the following regarding taking any blood thinners:   Plavix? no Aspirin?no Coumadin?  no  Patient confirms/reports the following medications:  Current Outpatient Prescriptions  Medication Sig Dispense Refill  . amLODipine (NORVASC) 10 MG tablet Take 1 tablet (10 mg total) by mouth at bedtime. This is a dose increase. Discontinue lotensin/hctz!  30 tablet  2  . cetirizine (ZYRTEC) 10 MG tablet Take 1 tablet (10 mg total) by mouth daily.  30 tablet  4  . fluticasone (FLONASE) 50 MCG/ACT nasal spray 1 spray by Nasal route daily.  16 g  2  . lovastatin (MEVACOR) 40 MG tablet Take 40 mg by mouth at bedtime.        . nabumetone (RELAFEN) 750 MG tablet Take 750 mg by mouth 2 (two) times daily.        . penicillin v potassium (VEETIDS) 500 MG tablet Take 1 tablet (500 mg total) by mouth 3 (three) times daily.  21 tablet  0  . potassium chloride (KLOR-CON) 20 MEQ packet Take 20 mEq by mouth daily.  30 packet  3  . QVAR 40 MCG/ACT inhaler USE 2 PUFFS TWICE DAILY. RINSE MOUTH AFTER USE.  8.7 Inhaler  1  . triamterene-hydrochlorothiazide (DYAZIDE) 37.5-25 MG per capsule Take 1 capsule by mouth daily.  30 capsule  3    Patient confirms/reports the following allergies:  Allergies  Allergen Reactions  . Lotensin Hct (Benazepril-Hydrochlorothiazide)     Hives      Patient is appropriate to schedule for requested procedure(s): yes  AUTHORIZATION INFORMATION Primary Insurance: ,  ID #: ,  Group #:  Pre-Cert / Auth required Pre-Cert / Auth #:  Secondary Insurance: ,  ID #: ,  Group #:  Pre-Cert / Auth required:  Pre-Cert / Auth #  Orders Placed This Encounter  Procedures  . Endoscopy, colon, diagnostic    Standing Status: Future     Number of Occurrences:      Standing Expiration Date: 05/01/2012    Order Specific Question:  Pre-op diagnosis    Answer:  NO    Order Specific Question:  Pre-op visit required?    Answer:  No [0]    SCHEDULE INFORMATION: Procedure has been scheduled as follows:  Date: 05/13/2011, Time: 11:30 AM  Location: [facility] Short Stay  This Gastroenterology Pre-Precedure Form is being routed to the following provider(s) for review: Jonette Eva, MD

## 2011-05-02 NOTE — Telephone Encounter (Signed)
SUPREP. 

## 2011-05-06 NOTE — Assessment & Plan Note (Signed)
Deteriorated. Patient re-educated about  the importance of commitment to a  minimum of 150 minutes of exercise per week. The importance of healthy food choices with portion control discussed. Encouraged to start a food diary, count calories and to consider  joining a support group. Sample diet sheets offered. Goals set by the patient for the next several months.    

## 2011-05-06 NOTE — Assessment & Plan Note (Signed)
Unchanged, toradol administered and weight loss encouraged

## 2011-05-06 NOTE — Assessment & Plan Note (Signed)
Deteriorated, low fat diet encouraged

## 2011-05-06 NOTE — Assessment & Plan Note (Signed)
Symptomatic uncontrolled allergies med sent in

## 2011-05-07 ENCOUNTER — Other Ambulatory Visit: Payer: Self-pay | Admitting: Family Medicine

## 2011-05-07 DIAGNOSIS — Z139 Encounter for screening, unspecified: Secondary | ICD-10-CM

## 2011-05-13 ENCOUNTER — Ambulatory Visit (HOSPITAL_COMMUNITY)
Admission: RE | Admit: 2011-05-13 | Discharge: 2011-05-13 | Disposition: A | Discharge status: Home / Self Care | Payer: Medicaid Other | Source: Ambulatory Visit | Attending: Gastroenterology | Admitting: Gastroenterology

## 2011-05-13 ENCOUNTER — Other Ambulatory Visit: Payer: Self-pay | Admitting: Gastroenterology

## 2011-05-13 ENCOUNTER — Encounter: Payer: Medicaid Other | Admitting: Gastroenterology

## 2011-05-13 DIAGNOSIS — Z79899 Other long term (current) drug therapy: Secondary | ICD-10-CM | POA: Insufficient documentation

## 2011-05-13 DIAGNOSIS — E785 Hyperlipidemia, unspecified: Secondary | ICD-10-CM | POA: Insufficient documentation

## 2011-05-13 DIAGNOSIS — K648 Other hemorrhoids: Secondary | ICD-10-CM | POA: Insufficient documentation

## 2011-05-13 DIAGNOSIS — K5730 Diverticulosis of large intestine without perforation or abscess without bleeding: Secondary | ICD-10-CM | POA: Insufficient documentation

## 2011-05-13 DIAGNOSIS — Z1211 Encounter for screening for malignant neoplasm of colon: Secondary | ICD-10-CM

## 2011-05-13 DIAGNOSIS — D126 Benign neoplasm of colon, unspecified: Secondary | ICD-10-CM | POA: Insufficient documentation

## 2011-05-13 DIAGNOSIS — I10 Essential (primary) hypertension: Secondary | ICD-10-CM | POA: Insufficient documentation

## 2011-05-13 DIAGNOSIS — D131 Benign neoplasm of stomach: Secondary | ICD-10-CM

## 2011-05-23 NOTE — Progress Notes (Signed)
Cc to PCP and 10 yr reminder is in the computer 

## 2011-06-03 ENCOUNTER — Ambulatory Visit (HOSPITAL_COMMUNITY)
Admission: RE | Admit: 2011-06-03 | Discharge: 2011-06-03 | Disposition: A | Discharge status: Home / Self Care | Payer: Medicaid Other | Source: Ambulatory Visit | Attending: Family Medicine | Admitting: Family Medicine

## 2011-06-03 ENCOUNTER — Ambulatory Visit (HOSPITAL_COMMUNITY): Payer: Medicaid Other

## 2011-06-03 DIAGNOSIS — Z1231 Encounter for screening mammogram for malignant neoplasm of breast: Secondary | ICD-10-CM | POA: Insufficient documentation

## 2011-06-03 DIAGNOSIS — Z139 Encounter for screening, unspecified: Secondary | ICD-10-CM

## 2011-06-05 NOTE — Op Note (Signed)
  [name], [name] NO.:  [id number]  MEDICAL RECORD NO.:  [id number]  LOCATION:  [facility]                          FACILITY:  [facility]  PHYSICIAN:  Jonette Eva, M.D.     DATE OF BIRTH:  1951/05/31  DATE OF PROCEDURE:  05/13/2011 DATE OF DISCHARGE:                              OPERATIVE REPORT   REFERRING PHYSICIAN:  Milus Mallick. Lodema Hong, MD  PROCEDURE:  Colonoscopy with cold forceps and polypectomy.  INDICATION FOR EXAM:  [name] is a 60 year old female who presents for average-risk colon cancer screening.  FINDINGS: 1. A 3-mm polypoid lesion in sigmoid colon removed via cold forceps.     Otherwise, no masses, inflammatory changes, or AVMs seen. 2. Frequent sigmoid colon diverticula. 3. Small internal hemorrhoids.  Otherwise normal retroflexed view of     the rectum.  RECOMMENDATIONS: 1. Will call with results of her polypectomy. 2. Next colonoscopy in 10 years. 3. She should follow a high-fiber diet.  She is given a handout on     high-fiber diet, polyps, diverticulosis, and hemorrhoids.  MEDICATIONS: 1. Demerol 75 mg IV. 2. Versed 4 mg IV.  PROCEDURE TECHNIQUE:  Physical exam was performed.  Informed consent was obtained from the patient after explaining the benefits, risks, and alternatives to the procedure.  The patient was connected to the monitor and placed in left lateral position.  Continuous oxygen was provided by nasal cannula and IV medicine was administered through an indwelling cannula.  After ministration of sedation and rectal exam, the patient's rectum was intubated and the scope was advanced under direct visualization to the cecum. Scope was removed slowly by careful examining the color, texture, anatomy, and integrity of the mucosa on the way out.  The patient was recovered in endoscopy and discharged home in satisfactory condition.   PATH: POLYPOID LESION  Jonette Eva, M.D.   CC: DR. Lodema Hong  SF/MEDQ  D:  05/13/2011   T:  05/14/2011  Job:  [id number]  Electronically Signed by Jonette Eva M.D. on 06/05/2011 02:38:37 PM

## 2011-06-10 ENCOUNTER — Other Ambulatory Visit: Payer: Self-pay | Admitting: Family Medicine

## 2011-07-03 ENCOUNTER — Ambulatory Visit (INDEPENDENT_AMBULATORY_CARE_PROVIDER_SITE_OTHER): Payer: Medicaid Other | Admitting: Family Medicine

## 2011-07-03 ENCOUNTER — Encounter: Payer: Self-pay | Admitting: Family Medicine

## 2011-07-03 VITALS — BP 120/80 | HR 75 | Temp 98.5°F | Resp 16 | Ht 67.0 in | Wt 208.8 lb

## 2011-07-03 DIAGNOSIS — E785 Hyperlipidemia, unspecified: Secondary | ICD-10-CM

## 2011-07-03 DIAGNOSIS — J40 Bronchitis, not specified as acute or chronic: Secondary | ICD-10-CM | POA: Insufficient documentation

## 2011-07-03 DIAGNOSIS — J209 Acute bronchitis, unspecified: Secondary | ICD-10-CM

## 2011-07-03 DIAGNOSIS — I10 Essential (primary) hypertension: Secondary | ICD-10-CM

## 2011-07-03 DIAGNOSIS — R7301 Impaired fasting glucose: Secondary | ICD-10-CM

## 2011-07-03 LAB — LIPID PANEL
Cholesterol: 168 mg/dL (ref 0–200)
HDL: 42 mg/dL (ref >39)
LDL Cholesterol: 102 mg/dL — ABNORMAL HIGH (ref 0–99)
Total CHOL/HDL Ratio: 4 Ratio
Triglycerides: 121 mg/dL (ref <150)
VLDL: 24 mg/dL (ref 0–40)

## 2011-07-03 LAB — BASIC METABOLIC PANEL
BUN: 23 mg/dL (ref 6–23)
CO2: 22 mEq/L (ref 19–32)
Calcium: 10.6 mg/dL — ABNORMAL HIGH (ref 8.4–10.5)
Chloride: 104 mEq/L (ref 96–112)
Creat: 1.16 mg/dL — ABNORMAL HIGH (ref 0.50–1.10)
Glucose, Bld: 92 mg/dL (ref 70–99)
Potassium: 4.2 mEq/L (ref 3.5–5.3)
Sodium: 143 mEq/L (ref 135–145)

## 2011-07-03 LAB — HEPATIC FUNCTION PANEL
ALT: 33 U/L (ref 0–35)
AST: 30 U/L (ref 0–37)
Albumin: 4.1 g/dL (ref 3.5–5.2)
Alkaline Phosphatase: 51 U/L (ref 39–117)
Bilirubin, Direct: <0.1 mg/dL (ref 0.0–0.3)
Total Bilirubin: 0.3 mg/dL (ref 0.3–1.2)
Total Protein: 8.4 g/dL — ABNORMAL HIGH (ref 6.0–8.3)

## 2011-07-03 MED ORDER — NABUMETONE 750 MG PO TABS: 750.0000 mg | ORAL_TABLET | Freq: Two times a day (BID) | ORAL | Status: DC

## 2011-07-03 MED ORDER — LOVASTATIN 40 MG PO TABS: 40.0000 mg | ORAL_TABLET | Freq: Every day | ORAL | Status: DC

## 2011-07-03 MED ORDER — CETIRIZINE HCL 10 MG PO TABS: 10.0000 mg | ORAL_TABLET | Freq: Every day | ORAL | Status: DC

## 2011-07-03 MED ORDER — POTASSIUM CHLORIDE 20 MEQ PO PACK: 20.0000 meq | PACK | Freq: Every day | ORAL | Status: DC

## 2011-07-03 MED ORDER — PENICILLIN V POTASSIUM 500 MG PO TABS: 500.0000 mg | ORAL_TABLET | Freq: Three times a day (TID) | ORAL | Status: AC

## 2011-07-03 MED ORDER — BECLOMETHASONE DIPROPIONATE 40 MCG/ACT IN AERS: 2.0000 | INHALATION_SPRAY | Freq: Two times a day (BID) | RESPIRATORY_TRACT | Status: DC

## 2011-07-03 MED ORDER — AMLODIPINE BESYLATE 10 MG PO TABS: 10.0000 mg | ORAL_TABLET | Freq: Every day | ORAL | Status: DC

## 2011-07-03 MED ORDER — BENZONATATE 100 MG PO CAPS: 100.0000 mg | ORAL_CAPSULE | Freq: Four times a day (QID) | ORAL | Status: DC | PRN

## 2011-07-03 MED ORDER — TRIAMTERENE-HCTZ 37.5-25 MG PO CAPS: 1.0000 | ORAL_CAPSULE | Freq: Every day | ORAL | Status: DC

## 2011-07-03 NOTE — Patient Instructions (Signed)
F/u end Dec/ early January  pls call in Sept for flu vaccine  HBA1C fasting lipid, hepatic chem 7 today and just before next  Visit   Med is sent in for bronchitis, penicillin and tessalon perles

## 2011-07-03 NOTE — Progress Notes (Signed)
  Subjective:    Patient ID: [name], female    DOB: 25-Aug-1951, 60 y.o.   MRN: [id number]  HPI 1 month h/o chest congestion , with cough, mainly non productive also chills. Denies sinus symptoms. Occasional left headach e  Recent colonscopy showed benign polyp per pt , she is now on high fiber diet and is losing weight   Review of Systems See HPI Denies chest pains, palpitations and leg swelling Denies abdominal pain, nausea, vomiting,diarrhea or constipation.   Denies dysuria, frequency, hesitancy or incontinence. Chronic back and joint pain, with  limitation in mobility. Denies headaches, seizures, numbness, or tingling. Denies depression, anxiety or insomnia. Denies skin break down or rash.        Objective:   Physical Exam Patient alert and oriented and in no cardiopulmonary distress.  HEENT: No facial asymmetry, EOMI, no sinus tenderness,  oropharynx pink and moist.  Neck supple no adenopathy.  Chest: decreased air entry, scattered crackles, few wheezes  CVS: S1, S2 no murmurs, no S3.  ABD: Soft non tender. Bowel sounds normal.  Ext: No edema  MS: decreased  ROM spine, shoulders, hips and knees.  Skin: Intact, no ulcerations or rash noted.  Psych: Good eye contact, normal affect. Memory intact not anxious or depressed appearing.  CNS: CN 2-12 intact, power, tone and sensation normal throughout.        Assessment & Plan:

## 2011-07-04 LAB — HEMOGLOBIN A1C
Hgb A1c MFr Bld: 6.1 % — ABNORMAL HIGH (ref <5.7)
Mean Plasma Glucose: 128 mg/dL — ABNORMAL HIGH (ref <117)

## 2011-07-14 DIAGNOSIS — J209 Acute bronchitis, unspecified: Secondary | ICD-10-CM | POA: Insufficient documentation

## 2011-07-14 NOTE — Assessment & Plan Note (Signed)
Improved , though still sub optimal control.cussed and encouraged

## 2011-07-14 NOTE — Assessment & Plan Note (Signed)
Acute symptoms, med prescribed

## 2011-07-14 NOTE — Assessment & Plan Note (Signed)
Controlled, no change in medication  

## 2011-07-16 ENCOUNTER — Encounter: Payer: Self-pay | Admitting: Orthopedic Surgery

## 2011-07-16 ENCOUNTER — Other Ambulatory Visit: Payer: Self-pay

## 2011-07-16 ENCOUNTER — Ambulatory Visit (INDEPENDENT_AMBULATORY_CARE_PROVIDER_SITE_OTHER): Payer: Medicaid Other | Admitting: Orthopedic Surgery

## 2011-07-16 VITALS — Resp 16 | Ht 67.0 in | Wt 197.0 lb

## 2011-07-16 DIAGNOSIS — M1710 Unilateral primary osteoarthritis, unspecified knee: Secondary | ICD-10-CM

## 2011-07-16 DIAGNOSIS — IMO0002 Reserved for concepts with insufficient information to code with codable children: Secondary | ICD-10-CM

## 2011-07-16 MED ORDER — METHYLPREDNISOLONE ACETATE 40 MG/ML IJ SUSP: 40.0000 mg | Freq: Once | INTRAMUSCULAR | Status: DC

## 2011-07-16 NOTE — Progress Notes (Signed)
New patient  Referring physician Dr. Syliva Overman  History she is a 60 year old female with a history of lumbar disc disease and spinal stenosis at L5-S1 and L4 and L5 she is status post epidural series in December of 2005 with an MRI from that time and also epidural series injections noted  Proximally 6 months ago the patient gradual onset of atraumatic dull pain described as moderate, intermittent and associated with swelling and walking but improved with arthritis medication and ibuprofen she is currently on nabumetone she is here for evaluation.  Review of systems she reports history of blurred vision double vision, shortness of breath wheezing coughing tightness in the chest, constipation, itching of the skin and seasonal ALLERGIES.  She denies numbness tingling, she denies catching locking or giving way of the LEFT knee  GENERAL: normal development   CDV: pulses are normal   Skin: normal  Lymph: nodes were not palpable/normal  Psychiatric: awake, alert and oriented  Neuro: normal sensation  MSK LEFT knee  Inspection revealed a lipoma the superior pole of patella she has patellofemoral crepitance and medial and lateral joint line tenderness.  She has excellent flexion of the knee but has a 5 flexion contracture.  Her knee is stable to strengthen her knee is normal the skin is intact she has a negative McMurray sign  Assessment:  X-rays were obtained and shows valgus osteoarthritis of the knee moderate.  Reasonable alignment.  Recommend injection    Plan: Return in 6 months if needed for second injection.  No surgery needed at this time.  Separate x-ray report [facility] lateral and patellofemoral view LEFT knee, recent fracture A. LEFT knee pain  Findings reasonable alignment, slight valgus, lateral joint space narrowing moderate.  Artifact on x-ray on the tibia.    Impression valgus osteoarthritis LEFT knee  Injection LEFT knee joint    Knee  Injection Procedure  Note  Knee  Injection Procedure Note  Pre-operative Diagnosis: left knee oa  Post-operative Diagnosis: same  Indications: pain  Anesthesia: ethyl chloride   Procedure Details   Verbal consent was obtained for the procedure. Time out was completed.The joint was prepped with alcohol, followed by  Ethyl chloride spray and A 20 gauge needle was inserted into the knee via lateral approach; 4ml 1% lidocaine and 1 ml of depomedrol  was then injected into the joint . The needle was removed and the area cleansed and dressed.  Complications:  None; patient tolerated the procedure well.

## 2011-07-16 NOTE — Patient Instructions (Signed)
You have received a steroid shot. 15% of patients experience increased pain at the injection site with in the next 24 hours. This is best treated with ice and tylenol extra strength 2 tabs every 8 hours. If you are still having pain please call the office.   You can get another injection in 6 months if needed

## 2011-08-30 ENCOUNTER — Ambulatory Visit (INDEPENDENT_AMBULATORY_CARE_PROVIDER_SITE_OTHER): Payer: Medicaid Other

## 2011-08-30 DIAGNOSIS — Z23 Encounter for immunization: Secondary | ICD-10-CM

## 2011-09-02 ENCOUNTER — Other Ambulatory Visit: Payer: Self-pay | Admitting: Family Medicine

## 2011-10-01 ENCOUNTER — Other Ambulatory Visit: Payer: Self-pay | Admitting: Family Medicine

## 2011-10-10 ENCOUNTER — Other Ambulatory Visit: Payer: Self-pay | Admitting: Family Medicine

## 2011-11-26 ENCOUNTER — Telehealth: Payer: Self-pay | Admitting: Family Medicine

## 2011-11-26 NOTE — Telephone Encounter (Signed)
She has already done that.

## 2011-11-26 NOTE — Telephone Encounter (Signed)
She is referred for a screening colonoscopy, pls let her know

## 2011-12-11 ENCOUNTER — Encounter: Payer: Self-pay | Admitting: Family Medicine

## 2011-12-12 ENCOUNTER — Encounter: Payer: Self-pay | Admitting: Family Medicine

## 2011-12-12 ENCOUNTER — Ambulatory Visit (INDEPENDENT_AMBULATORY_CARE_PROVIDER_SITE_OTHER): Payer: Medicaid Other | Admitting: Family Medicine

## 2011-12-12 DIAGNOSIS — H9201 Otalgia, right ear: Secondary | ICD-10-CM | POA: Insufficient documentation

## 2011-12-12 DIAGNOSIS — M25559 Pain in unspecified hip: Secondary | ICD-10-CM

## 2011-12-12 DIAGNOSIS — H9209 Otalgia, unspecified ear: Secondary | ICD-10-CM

## 2011-12-12 DIAGNOSIS — E669 Obesity, unspecified: Secondary | ICD-10-CM

## 2011-12-12 DIAGNOSIS — M549 Dorsalgia, unspecified: Secondary | ICD-10-CM

## 2011-12-12 DIAGNOSIS — E785 Hyperlipidemia, unspecified: Secondary | ICD-10-CM

## 2011-12-12 DIAGNOSIS — Z23 Encounter for immunization: Secondary | ICD-10-CM

## 2011-12-12 DIAGNOSIS — I10 Essential (primary) hypertension: Secondary | ICD-10-CM

## 2011-12-12 MED ORDER — NABUMETONE 750 MG PO TABS: 750.0000 mg | ORAL_TABLET | Freq: Two times a day (BID) | ORAL | Status: DC

## 2011-12-12 MED ORDER — KETOROLAC TROMETHAMINE 60 MG/2ML IJ SOLN
60.0000 mg | Freq: Once | INTRAMUSCULAR | Status: AC
  Administered 2011-12-12: 60 mg via INTRAMUSCULAR

## 2011-12-12 MED ORDER — POTASSIUM CHLORIDE 20 MEQ PO PACK: 20.0000 meq | PACK | Freq: Every day | ORAL | Status: DC

## 2011-12-12 MED ORDER — BECLOMETHASONE DIPROPIONATE 40 MCG/ACT IN AERS: 2.0000 | INHALATION_SPRAY | Freq: Two times a day (BID) | RESPIRATORY_TRACT | Status: DC

## 2011-12-12 MED ORDER — FLUTICASONE PROPIONATE 50 MCG/ACT NA SUSP: 1.0000 | Freq: Every day | NASAL | Status: DC

## 2011-12-12 MED ORDER — AMLODIPINE BESYLATE 10 MG PO TABS: 10.0000 mg | ORAL_TABLET | Freq: Every day | ORAL | Status: DC

## 2011-12-12 MED ORDER — LOVASTATIN 40 MG PO TABS: 40.0000 mg | ORAL_TABLET | Freq: Every day | ORAL | Status: DC

## 2011-12-12 MED ORDER — TRIAMTERENE-HCTZ 37.5-25 MG PO CAPS: 1.0000 | ORAL_CAPSULE | Freq: Every day | ORAL | Status: DC

## 2011-12-12 MED ORDER — CETIRIZINE HCL 10 MG PO TABS: 10.0000 mg | ORAL_TABLET | Freq: Every day | ORAL | Status: DC

## 2011-12-12 NOTE — Assessment & Plan Note (Signed)
Hyperlipidemia:Low fat diet discussed and encouraged.   

## 2011-12-12 NOTE — Assessment & Plan Note (Signed)
Deteriorated. Patient re-educated about  the importance of commitment to a  minimum of 150 minutes of exercise per week. The importance of healthy food choices with portion control discussed. Encouraged to start a food diary, count calories and to consider  joining a support group. Sample diet sheets offered. Goals set by the patient for the next several months.    

## 2011-12-12 NOTE — Assessment & Plan Note (Signed)
Controlled, no change in medication  

## 2011-12-12 NOTE — Assessment & Plan Note (Signed)
Chronic otalgia with hearing loss, eNT eval

## 2011-12-12 NOTE — Patient Instructions (Addendum)
F/u in 4 month  hBa1C and chem 7 today   Fasting lipid and cMP in 4 months, same day as visit   You are being referred to eNT regarding ear pain   Please work on lifestyle change as far as eating is concerned so that you are able to lose weight  Injection today for back and hip pain

## 2011-12-13 LAB — HEMOGLOBIN A1C
Hgb A1c MFr Bld: 5.8 % — ABNORMAL HIGH (ref <5.7)
Mean Plasma Glucose: 120 mg/dL — ABNORMAL HIGH (ref <117)

## 2011-12-13 LAB — COMPREHENSIVE METABOLIC PANEL
ALT: 34 U/L (ref 0–35)
AST: 29 U/L (ref 0–37)
Albumin: 4.1 g/dL (ref 3.5–5.2)
Alkaline Phosphatase: 73 U/L (ref 39–117)
BUN: 22 mg/dL (ref 6–23)
CO2: 23 mEq/L (ref 19–32)
Calcium: 9.5 mg/dL (ref 8.4–10.5)
Chloride: 106 mEq/L (ref 96–112)
Creat: 0.85 mg/dL (ref 0.50–1.10)
Glucose, Bld: 113 mg/dL — ABNORMAL HIGH (ref 70–99)
Potassium: 3.9 mEq/L (ref 3.5–5.3)
Sodium: 143 mEq/L (ref 135–145)
Total Bilirubin: 0.3 mg/dL (ref 0.3–1.2)
Total Protein: 7.6 g/dL (ref 6.0–8.3)

## 2011-12-15 NOTE — Progress Notes (Signed)
Subjective:     Patient ID: [name], female   DOB: [birth year], 61 y.o.   MRN: [id number]  HPI The PT is here for follow up and re-evaluation of chronic medical conditions, medication management and review of any available recent lab and radiology data.  Preventive health is updated, specifically  Cancer screening and Immunization.   Questions or concerns regarding consultations or procedures which the PT has had in the interim are  addressed. The PT denies any adverse reactions to current medications since the last visit.  C/o persistent right ear pain, denies drainage, has been on antibiotics with no/incomplete resolution of symptoms C/o increased back and hip pain, requests injection for this   Review of Systems See HPI Denies recent fever or chills. Denies sinus pressure, nasal congestion, or sore throat. Denies chest congestion, productive cough or wheezing. Denies chest pains, palpitations and leg swelling Denies abdominal pain, nausea, vomiting,diarrhea or constipation.   Denies dysuria, frequency, hesitancy or incontinence.  Denies headaches, seizures, numbness, or tingling. Denies depression, anxiety or insomnia. Denies skin break down or rash.        Objective:   Physical Exam Patient alert and oriented and in no cardiopulmonary distress.  HEENT: No facial asymmetry, EOMI, no sinus tenderness,  oropharynx pink and moist.  Neck supple no adenopathy.  Chest: Clear to auscultation bilaterally.  CVS: S1, S2 no murmurs, no S3.  ABD: Soft non tender. Bowel sounds normal.  Ext: No edema  MS: Decreased ROM spine, and , hips and knees.  Skin: Intact, no ulcerations or rash noted.  Psych: Good eye contact, normal affect. Memory intact not anxious or depressed appearing.  CNS: CN 2-12 intact, power, tone and sensation normal throughout.     Assessment:        Plan:

## 2011-12-15 NOTE — Assessment & Plan Note (Signed)
Increased and uncontrolled, anti inflammatory injection in the office

## 2012-01-02 ENCOUNTER — Ambulatory Visit (INDEPENDENT_AMBULATORY_CARE_PROVIDER_SITE_OTHER): Payer: Medicaid Other | Admitting: Otolaryngology

## 2012-01-02 DIAGNOSIS — H9209 Otalgia, unspecified ear: Secondary | ICD-10-CM

## 2012-01-02 DIAGNOSIS — H903 Sensorineural hearing loss, bilateral: Secondary | ICD-10-CM

## 2012-04-13 ENCOUNTER — Ambulatory Visit (INDEPENDENT_AMBULATORY_CARE_PROVIDER_SITE_OTHER): Payer: Medicaid Other | Admitting: Family Medicine

## 2012-04-13 ENCOUNTER — Encounter: Payer: Self-pay | Admitting: Family Medicine

## 2012-04-13 VITALS — BP 112/80 | HR 74 | Resp 16 | Ht 67.0 in | Wt 207.8 lb

## 2012-04-13 DIAGNOSIS — R5381 Other malaise: Secondary | ICD-10-CM

## 2012-04-13 DIAGNOSIS — E669 Obesity, unspecified: Secondary | ICD-10-CM

## 2012-04-13 DIAGNOSIS — M25559 Pain in unspecified hip: Secondary | ICD-10-CM

## 2012-04-13 DIAGNOSIS — M899 Disorder of bone, unspecified: Secondary | ICD-10-CM

## 2012-04-13 DIAGNOSIS — R5383 Other fatigue: Secondary | ICD-10-CM

## 2012-04-13 DIAGNOSIS — I10 Essential (primary) hypertension: Secondary | ICD-10-CM

## 2012-04-13 DIAGNOSIS — M549 Dorsalgia, unspecified: Secondary | ICD-10-CM

## 2012-04-13 DIAGNOSIS — M949 Disorder of cartilage, unspecified: Secondary | ICD-10-CM

## 2012-04-13 DIAGNOSIS — E785 Hyperlipidemia, unspecified: Secondary | ICD-10-CM

## 2012-04-13 LAB — COMPLETE METABOLIC PANEL WITH GFR
ALT: 27 U/L (ref 0–35)
AST: 25 U/L (ref 0–37)
Albumin: 4.1 g/dL (ref 3.5–5.2)
Alkaline Phosphatase: 65 U/L (ref 39–117)
BUN: 15 mg/dL (ref 6–23)
CO2: 28 mEq/L (ref 19–32)
Calcium: 9.7 mg/dL (ref 8.4–10.5)
Chloride: 105 mEq/L (ref 96–112)
Creat: 0.89 mg/dL (ref 0.50–1.10)
GFR, Est African American: 81 mL/min
GFR, Est Non African American: 70 mL/min
Glucose, Bld: 97 mg/dL (ref 70–99)
Potassium: 4.2 mEq/L (ref 3.5–5.3)
Sodium: 141 mEq/L (ref 135–145)
Total Bilirubin: 0.4 mg/dL (ref 0.3–1.2)
Total Protein: 7.4 g/dL (ref 6.0–8.3)

## 2012-04-13 LAB — HEMOGLOBIN A1C
Hgb A1c MFr Bld: 5.7 % — ABNORMAL HIGH (ref <5.7)
Mean Plasma Glucose: 117 mg/dL — ABNORMAL HIGH (ref <117)

## 2012-04-13 LAB — CBC WITH DIFFERENTIAL/PLATELET
Basophils Absolute: 0 10*3/uL (ref 0.0–0.1)
Basophils Relative: 1 % (ref 0–1)
Eosinophils Absolute: 0.1 10*3/uL (ref 0.0–0.7)
Eosinophils Relative: 1 % (ref 0–5)
HCT: 38.1 % (ref 36.0–46.0)
Hemoglobin: 12.5 g/dL (ref 12.0–15.0)
Lymphocytes Relative: 30 % (ref 12–46)
Lymphs Abs: 1.9 10*3/uL (ref 0.7–4.0)
MCH: 32.1 pg (ref 26.0–34.0)
MCHC: 32.8 g/dL (ref 30.0–36.0)
MCV: 97.7 fL (ref 78.0–100.0)
Monocytes Absolute: 0.4 10*3/uL (ref 0.1–1.0)
Monocytes Relative: 6 % (ref 3–12)
Neutro Abs: 4 10*3/uL (ref 1.7–7.7)
Neutrophils Relative %: 62 % (ref 43–77)
Platelets: 213 10*3/uL (ref 150–400)
RBC: 3.9 MIL/uL (ref 3.87–5.11)
RDW: 13.3 % (ref 11.5–15.5)
WBC: 6.4 10*3/uL (ref 4.0–10.5)

## 2012-04-13 LAB — LIPID PANEL
Cholesterol: 175 mg/dL (ref 0–200)
HDL: 62 mg/dL (ref >39)
LDL Cholesterol: 92 mg/dL (ref 0–99)
Total CHOL/HDL Ratio: 2.8 Ratio
Triglycerides: 105 mg/dL (ref <150)
VLDL: 21 mg/dL (ref 0–40)

## 2012-04-13 LAB — TSH: TSH: 2.908 u[IU]/mL (ref 0.350–4.500)

## 2012-04-13 MED ORDER — KETOROLAC TROMETHAMINE 60 MG/2ML IM SOLN
60.0000 mg | Freq: Once | INTRAMUSCULAR | Status: AC
  Administered 2012-04-13: 60 mg via INTRAMUSCULAR

## 2012-04-13 MED ORDER — METHYLPREDNISOLONE ACETATE 80 MG/ML IJ SUSP
80.0000 mg | Freq: Once | INTRAMUSCULAR | Status: AC
  Administered 2012-04-13: 80 mg via INTRAMUSCULAR

## 2012-04-13 NOTE — Assessment & Plan Note (Signed)
Increased and uncontrolled with instability, ortho eval

## 2012-04-13 NOTE — Assessment & Plan Note (Signed)
Hyperlipidemia:Low fat diet discussed and encouraged.  Updated lab data needed 

## 2012-04-13 NOTE — Progress Notes (Signed)
  Subjective:    Patient ID: [name], female    DOB: June 12, 1951, 61 y.o.   MRN: [id number]  HPI The PT is here for follow up and re-evaluation of chronic medical conditions, medication management and review of any available recent lab and radiology data.  Preventive health is updated, specifically  Cancer screening and Immunization.   Questions or concerns regarding consultations or procedures which the PT has had in the interim are  addressed. The PT denies any adverse reactions to current medications since the last visit.  There are no new concerns.  1 month h/o increased right hip and left knee pain, hip worse than knee. Increased instability, wants referral for local ortho for evaluation   Review of Systems See HPI Denies recent fever or chills. Denies sinus pressure, nasal congestion, ear pain or sore throat. Denies chest congestion, productive cough or wheezing. Denies chest pains, palpitations and leg swelling Denies abdominal pain, nausea, vomiting,diarrhea or constipation.   Denies dysuria, frequency, hesitancy or incontinence. Denies headaches, seizures, numbness, or tingling. Denies depression, anxiety or insomnia. Denies skin break down or rash.        Objective:   Physical Exam  Patient alert and oriented and in no cardiopulmonary distress.  HEENT: No facial asymmetry, EOMI, no sinus tenderness,  oropharynx pink and moist.  Neck supple no adenopathy.  Chest: Clear to auscultation bilaterally.  CVS: S1, S2 no murmurs, no S3.  ABD: Soft non tender. Bowel sounds normal.  Ext: No edema  MS: decreased  ROM spine, hips and knees.  Skin: Intact, no ulcerations or rash noted.  Psych: Good eye contact, normal affect. Memory intact not anxious or depressed appearing.  CNS: CN 2-12 intact, power, tone and sensation normal throughout.       Assessment & Plan:

## 2012-04-13 NOTE — Patient Instructions (Addendum)
F/U in 4 month, weigh loss goal of 12 pounds  Call if you need me before  Fasting labs today.  Please work on weight loss, this will help to protect your joints.   You need the shingles shot, check your pharmacy to se if covered, you can get it there or in our office if covered  Mammogram due end June or early July, please schedule  You are refrred to Dr Romeo Apple about right hip and will get injections in the office today

## 2012-04-13 NOTE — Assessment & Plan Note (Signed)
Deteriorated. Patient re-educated about  the importance of commitment to a  minimum of 150 minutes of exercise per week. The importance of healthy food choices with portion control discussed. Encouraged to start a food diary, count calories and to consider  joining a support group. Sample diet sheets offered. Goals set by the patient for the next several months.    

## 2012-04-13 NOTE — Assessment & Plan Note (Signed)
Controlled, no change in medication  

## 2012-04-13 NOTE — Assessment & Plan Note (Signed)
Anti inflammatory injection in office today

## 2012-04-14 ENCOUNTER — Other Ambulatory Visit: Payer: Self-pay | Admitting: Family Medicine

## 2012-04-14 ENCOUNTER — Other Ambulatory Visit: Payer: Self-pay

## 2012-04-14 DIAGNOSIS — I10 Essential (primary) hypertension: Secondary | ICD-10-CM

## 2012-04-14 LAB — VITAMIN D 25 HYDROXY (VIT D DEFICIENCY, FRACTURES): Vit D, 25-Hydroxy: 15 ng/mL — ABNORMAL LOW (ref 30–89)

## 2012-04-14 MED ORDER — TRIAMTERENE-HCTZ 37.5-25 MG PO CAPS: 1.0000 | ORAL_CAPSULE | Freq: Every day | ORAL | Status: DC

## 2012-04-14 MED ORDER — LOVASTATIN 40 MG PO TABS: 40.0000 mg | ORAL_TABLET | Freq: Every day | ORAL | Status: DC

## 2012-04-14 MED ORDER — AMLODIPINE BESYLATE 10 MG PO TABS: 10.0000 mg | ORAL_TABLET | Freq: Every day | ORAL | Status: DC

## 2012-04-14 MED ORDER — CETIRIZINE HCL 10 MG PO TABS: 10.0000 mg | ORAL_TABLET | Freq: Every day | ORAL | Status: DC

## 2012-05-05 ENCOUNTER — Encounter: Payer: Self-pay | Admitting: Orthopedic Surgery

## 2012-05-05 ENCOUNTER — Ambulatory Visit (INDEPENDENT_AMBULATORY_CARE_PROVIDER_SITE_OTHER): Payer: Medicaid Other | Admitting: Orthopedic Surgery

## 2012-05-05 ENCOUNTER — Ambulatory Visit (INDEPENDENT_AMBULATORY_CARE_PROVIDER_SITE_OTHER): Payer: Medicaid Other

## 2012-05-05 VITALS — BP 100/72 | Wt 202.0 lb

## 2012-05-05 DIAGNOSIS — M25559 Pain in unspecified hip: Secondary | ICD-10-CM

## 2012-05-05 DIAGNOSIS — M5137 Other intervertebral disc degeneration, lumbosacral region: Secondary | ICD-10-CM

## 2012-05-05 DIAGNOSIS — M51379 Other intervertebral disc degeneration, lumbosacral region without mention of lumbar back pain or lower extremity pain: Secondary | ICD-10-CM

## 2012-05-05 NOTE — Progress Notes (Signed)
  Subjective:    [name] is a 61 y.o. female presents at the request of Dr. Syliva Overman for evaluation of her right hip. GEN MRI back in 2005 of her lumbar spine which showed significant and severe degenerative disc disease. She was evaluated by Dr. Newell Coral. It was felt at that time that surgery would not help.  She presents back again today with right-sided hip pain as a chief complaint. The pain she says came on suddenly there was no injury it is sharp it is 7/10 it tends to come and go it is worse after activity such as standing bending over or walking nothing makes it better she says the hip is catching. She places her hand over her gluteal region and the lateral iliac crest.  She denies groin pain or anterior thigh pain. As far as her review of systems she has a history of shortness of breath cough and chest tightness she said changes in the skin with rash and itching she does have some seasonal allergies. She complains of joint pain swelling instability and stiffness.  The following portions of the patient's history were reviewed and updated as appropriate: allergies, current medications, past family history, past medical history, past social history, past surgical history and problem list.   Review of Systems Pertinent items are noted in HPI.   Objective:    BP 122/72  Ht 4\' 11"  (1.499 m)  Wt 61.236 kg (135 lb)  BMI 27.27 kg/m2        Vital signs are stable as recorded  General appearance is normal  The patient is alert and oriented x3  The patient's mood and affect are normal  Gait assessment: She is ambulatory with a cane The cardiovascular exam reveals normal pulses and temperature without edema swelling.  The lymphatic system is negative for palpable lymph nodes  The sensory exam is normal.  There are no pathologic reflexes.  Balance is normal.   Exam of the right hip shows normal hip flexion as well as internal and external rotation with no pain.  The hip is stable she can flex the right leg at the hip joint with 5 over 5 strength. There is no skin abnormality.  X-ray right hip x-ray shows an inferior osteophyte but normal joint space without narrowing. Impression mild hip arthritis   Assessment:    Right Hip arthritis mild with underlying severe degenerative joint disease in the lumbar spine as evidenced on MRI from 2005 there is also associated spondylolisthesis at that time with multilevel disease    Plan:    we recommend the patient see the neurosurgeon again as she does not need hip replacement surgery

## 2012-05-05 NOTE — Patient Instructions (Signed)
Your hip is fine   See DR Reita Cliche for further care

## 2012-05-12 ENCOUNTER — Other Ambulatory Visit: Payer: Self-pay | Admitting: Family Medicine

## 2012-05-12 DIAGNOSIS — Z139 Encounter for screening, unspecified: Secondary | ICD-10-CM

## 2012-05-15 ENCOUNTER — Telehealth: Payer: Self-pay | Admitting: *Deleted

## 2012-05-15 NOTE — Telephone Encounter (Signed)
Patient has been notified by their office

## 2012-06-08 ENCOUNTER — Ambulatory Visit (HOSPITAL_COMMUNITY)
Admission: RE | Admit: 2012-06-08 | Discharge: 2012-06-08 | Disposition: A | Discharge status: Home / Self Care | Payer: Medicaid Other | Source: Ambulatory Visit | Attending: Family Medicine | Admitting: Family Medicine

## 2012-06-08 DIAGNOSIS — Z139 Encounter for screening, unspecified: Secondary | ICD-10-CM

## 2012-06-08 DIAGNOSIS — Z1231 Encounter for screening mammogram for malignant neoplasm of breast: Secondary | ICD-10-CM | POA: Insufficient documentation

## 2012-08-14 ENCOUNTER — Encounter: Payer: Self-pay | Admitting: Family Medicine

## 2012-08-14 ENCOUNTER — Ambulatory Visit (INDEPENDENT_AMBULATORY_CARE_PROVIDER_SITE_OTHER): Payer: Medicaid Other | Admitting: Family Medicine

## 2012-08-14 VITALS — BP 120/70 | HR 70 | Resp 18 | Ht 67.0 in | Wt 202.1 lb

## 2012-08-14 DIAGNOSIS — E785 Hyperlipidemia, unspecified: Secondary | ICD-10-CM

## 2012-08-14 DIAGNOSIS — M25559 Pain in unspecified hip: Secondary | ICD-10-CM

## 2012-08-14 DIAGNOSIS — E669 Obesity, unspecified: Secondary | ICD-10-CM

## 2012-08-14 DIAGNOSIS — R7301 Impaired fasting glucose: Secondary | ICD-10-CM

## 2012-08-14 DIAGNOSIS — Z23 Encounter for immunization: Secondary | ICD-10-CM

## 2012-08-14 DIAGNOSIS — I10 Essential (primary) hypertension: Secondary | ICD-10-CM

## 2012-08-14 MED ORDER — NABUMETONE 750 MG PO TABS: 750.0000 mg | ORAL_TABLET | Freq: Two times a day (BID) | ORAL | Status: DC

## 2012-08-14 MED ORDER — KETOROLAC TROMETHAMINE 60 MG/2ML IJ SOLN
60.0000 mg | Freq: Once | INTRAMUSCULAR | Status: AC
  Administered 2012-08-14: 60 mg via INTRAMUSCULAR

## 2012-08-14 NOTE — Patient Instructions (Addendum)
Annual exam in 4 month, call if you need me before   Flu vaccine and toradol 60mg  IM today.  Fasting labs lipid and cmp prior to next visit

## 2012-08-15 NOTE — Assessment & Plan Note (Signed)
Increased and unconrolled, toradol 60mg  IM at visit

## 2012-08-15 NOTE — Progress Notes (Signed)
  Subjective:    Patient ID: [name], female    DOB: 1951/04/16, 61 y.o.   MRN: [id number]  HPI The PT is here for follow up and re-evaluation of chronic medical conditions, medication management and review of any available recent lab and radiology data.  Preventive health is updated, specifically  Cancer screening and Immunization.   Questions or concerns regarding consultations or procedures which the PT has had in the interim are  Addressed.Recently saw neurosurgeon re back pain and disc disease, surgery not considered a viable optioncurrently The PT denies any adverse reactions to current medications since the last visit.  There are no new concerns.  C/o increased hip pain, requests injection for pain, also c/o worsening vision  Review of Systems See HPI Denies recent fever or chills. Denies sinus pressure, nasal congestion, ear pain or sore throat. Denies chest congestion, productive cough or wheezing. Denies chest pains, palpitations and leg swelling Denies abdominal pain, nausea, vomiting,diarrhea or constipation.   Denies dysuria, frequency, hesitancy or incontinence.  Denies headaches, seizures, numbness, or tingling. Denies depression, anxiety or insomnia. Denies skin break down or rash.        Objective:   Physical Exam  Patient alert and oriented and in no cardiopulmonary distress.  HEENT: No facial asymmetry, EOMI, no sinus tenderness,  oropharynx pink and moist.  Neck supple no adenopathy.  Chest: Clear to auscultation bilaterally.  CVS: S1, S2 no murmurs, no S3.  ABD: Soft non tender. Bowel sounds normal.  Ext: No edema  MS: decreased  ROM spine, shoulders, hips and knees.  Skin: Intact, no ulcerations or rash noted.  Psych:  normal affect. Memory intact not anxious or depressed appearing.  CNS: CN 2-12 intact, power, tone and sensation normal throughout.       Assessment & Plan:

## 2012-08-15 NOTE — Assessment & Plan Note (Signed)
Controlled, no change in medication DASH diet and commitment to daily physical activity for a minimum of 30 minutes discussed and encouraged, as a part of hypertension management. The importance of attaining a healthy weight is also discussed.  

## 2012-08-15 NOTE — Assessment & Plan Note (Signed)
Unchanged. Patient re-educated about  the importance of commitment to a  minimum of 150 minutes of exercise per week. The importance of healthy food choices with portion control discussed. Encouraged to start a food diary, count calories and to consider  joining a support group. Sample diet sheets offered. Goals set by the patient for the next several months.    

## 2012-08-15 NOTE — Assessment & Plan Note (Signed)
Hyperlipidemia:Low fat diet discussed and encouraged.  Controlled, no change in medication Labs prior to next visit

## 2012-10-06 ENCOUNTER — Other Ambulatory Visit: Payer: Self-pay | Admitting: Family Medicine

## 2012-12-17 ENCOUNTER — Encounter: Payer: Self-pay | Admitting: Family Medicine

## 2012-12-17 ENCOUNTER — Ambulatory Visit (INDEPENDENT_AMBULATORY_CARE_PROVIDER_SITE_OTHER): Payer: Medicaid Other | Admitting: Family Medicine

## 2012-12-17 VITALS — BP 138/82 | HR 84 | Resp 18 | Ht 67.0 in | Wt 210.1 lb

## 2012-12-17 DIAGNOSIS — I10 Essential (primary) hypertension: Secondary | ICD-10-CM

## 2012-12-17 DIAGNOSIS — R7303 Prediabetes: Secondary | ICD-10-CM

## 2012-12-17 DIAGNOSIS — E785 Hyperlipidemia, unspecified: Secondary | ICD-10-CM

## 2012-12-17 DIAGNOSIS — E669 Obesity, unspecified: Secondary | ICD-10-CM

## 2012-12-17 DIAGNOSIS — M5137 Other intervertebral disc degeneration, lumbosacral region: Secondary | ICD-10-CM

## 2012-12-17 DIAGNOSIS — R7309 Other abnormal glucose: Secondary | ICD-10-CM

## 2012-12-17 LAB — COMPREHENSIVE METABOLIC PANEL
ALT: 27 U/L (ref 0–35)
AST: 23 U/L (ref 0–37)
Albumin: 4.1 g/dL (ref 3.5–5.2)
Alkaline Phosphatase: 60 U/L (ref 39–117)
BUN: 16 mg/dL (ref 6–23)
CO2: 27 mEq/L (ref 19–32)
Calcium: 10 mg/dL (ref 8.4–10.5)
Chloride: 106 mEq/L (ref 96–112)
Creat: 0.88 mg/dL (ref 0.50–1.10)
Glucose, Bld: 83 mg/dL (ref 70–99)
Potassium: 4.4 mEq/L (ref 3.5–5.3)
Sodium: 143 mEq/L (ref 135–145)
Total Bilirubin: 0.3 mg/dL (ref 0.3–1.2)
Total Protein: 7.4 g/dL (ref 6.0–8.3)

## 2012-12-17 LAB — LIPID PANEL
Cholesterol: 210 mg/dL — ABNORMAL HIGH (ref 0–200)
HDL: 66 mg/dL (ref >39)
LDL Cholesterol: 122 mg/dL — ABNORMAL HIGH (ref 0–99)
Total CHOL/HDL Ratio: 3.2 Ratio
Triglycerides: 111 mg/dL (ref <150)
VLDL: 22 mg/dL (ref 0–40)

## 2012-12-17 NOTE — Progress Notes (Signed)
  Subjective:    Patient ID: [name], female    DOB: 12/28/1950, 62 y.o.   MRN: [id number]  HPI The PT is here for follow up and re-evaluation of chronic medical conditions, medication management and review of any available recent lab and radiology data.  Preventive health is updated, specifically  Cancer screening and Immunization.   Questions or concerns regarding consultations or procedures which the PT has had in the interim are  addressed. The PT denies any adverse reactions to current medications since the last visit.  There are no new concerns.  There are no specific complaints     Review of Systems See HPI Denies recent fever or chills. Denies sinus pressure, nasal congestion, ear pain or sore throat. Denies chest congestion, productive cough or wheezing. Denies chest pains, palpitations and leg swelling Denies abdominal pain, nausea, vomiting,diarrhea or constipation.   Denies dysuria, frequency, hesitancy or incontinence. Chronic back  pain,  and limitation in mobility. Denies headaches, seizures, numbness, or tingling. Denies depression, anxiety or insomnia. Denies skin break down or rash.        Objective:   Physical Exam Patient alert and oriented and in no cardiopulmonary distress.  HEENT: No facial asymmetry, EOMI, no sinus tenderness,  oropharynx pink and moist.  Neck supple no adenopathy.  Chest: Clear to auscultation bilaterally.  CVS: S1, S2 no murmurs, no S3.  ABD: Soft non tender. Bowel sounds normal.  Ext: No edema  [id number]  ROM spine, shoulders, hips and knees.  Skin: Intact, no ulcerations or rash noted.  Psych: Good eye contact, normal affect. Memory intact not anxious or depressed appearing.  CNS: CN 2-12 intact, power, tone and sensation normal throughout.     Assessment & Plan:

## 2012-12-17 NOTE — Patient Instructions (Addendum)
F/U in 5 month, please call if you need me before  Fasting lipid, cmp today.  No changes in meds, flonase will be refilled.  Shingles vaccine is not covered

## 2012-12-30 ENCOUNTER — Other Ambulatory Visit: Payer: Self-pay

## 2012-12-30 MED ORDER — BECLOMETHASONE DIPROPIONATE 40 MCG/ACT IN AERS: 2.0000 | INHALATION_SPRAY | Freq: Two times a day (BID) | RESPIRATORY_TRACT | Status: DC

## 2013-01-06 ENCOUNTER — Other Ambulatory Visit: Payer: Self-pay | Admitting: Family Medicine

## 2013-01-07 DIAGNOSIS — R7303 Prediabetes: Secondary | ICD-10-CM | POA: Insufficient documentation

## 2013-01-07 NOTE — Assessment & Plan Note (Signed)
Controlled, no change in medication DASH diet and commitment to daily physical activity for a minimum of 30 minutes discussed and encouraged, as a part of hypertension management. The importance of attaining a healthy weight is also discussed.  

## 2013-01-07 NOTE — Assessment & Plan Note (Signed)
Deteriorated, re educated re low fat diet , no change in med at this time

## 2013-01-07 NOTE — Assessment & Plan Note (Signed)
Unchanged, continue meds as before 

## 2013-01-07 NOTE — Assessment & Plan Note (Signed)
Updated lab needed,. The importance of weight loss, reduced carb intake and commitment to daily exercise stressed to reduce the risk of developing diabetes

## 2013-01-07 NOTE — Assessment & Plan Note (Signed)
Deteriorated. Patient re-educated about  the importance of commitment to a  minimum of 150 minutes of exercise per week. The importance of healthy food choices with portion control discussed. Encouraged to start a food diary, count calories and to consider  joining a support group. Sample diet sheets offered. Goals set by the patient for the next several months.    

## 2013-04-06 ENCOUNTER — Other Ambulatory Visit: Payer: Self-pay | Admitting: Family Medicine

## 2013-05-12 ENCOUNTER — Other Ambulatory Visit: Payer: Self-pay | Admitting: Family Medicine

## 2013-05-12 DIAGNOSIS — Z139 Encounter for screening, unspecified: Secondary | ICD-10-CM

## 2013-05-17 ENCOUNTER — Encounter: Payer: Self-pay | Admitting: Family Medicine

## 2013-05-17 ENCOUNTER — Ambulatory Visit (INDEPENDENT_AMBULATORY_CARE_PROVIDER_SITE_OTHER): Payer: Medicaid Other | Admitting: Family Medicine

## 2013-05-17 ENCOUNTER — Other Ambulatory Visit: Payer: Self-pay | Admitting: Family Medicine

## 2013-05-17 VITALS — BP 122/82 | HR 75 | Resp 16 | Ht 67.0 in | Wt 212.0 lb

## 2013-05-17 DIAGNOSIS — H547 Unspecified visual loss: Secondary | ICD-10-CM

## 2013-05-17 DIAGNOSIS — R6884 Jaw pain: Secondary | ICD-10-CM

## 2013-05-17 DIAGNOSIS — E669 Obesity, unspecified: Secondary | ICD-10-CM

## 2013-05-17 DIAGNOSIS — R7301 Impaired fasting glucose: Secondary | ICD-10-CM

## 2013-05-17 DIAGNOSIS — M549 Dorsalgia, unspecified: Secondary | ICD-10-CM

## 2013-05-17 DIAGNOSIS — E042 Nontoxic multinodular goiter: Secondary | ICD-10-CM

## 2013-05-17 DIAGNOSIS — R5381 Other malaise: Secondary | ICD-10-CM

## 2013-05-17 DIAGNOSIS — R7309 Other abnormal glucose: Secondary | ICD-10-CM

## 2013-05-17 DIAGNOSIS — I10 Essential (primary) hypertension: Secondary | ICD-10-CM

## 2013-05-17 DIAGNOSIS — E785 Hyperlipidemia, unspecified: Secondary | ICD-10-CM

## 2013-05-17 DIAGNOSIS — R7303 Prediabetes: Secondary | ICD-10-CM

## 2013-05-17 LAB — COMPREHENSIVE METABOLIC PANEL
ALT: 25 U/L (ref 0–35)
AST: 22 U/L (ref 0–37)
Albumin: 4 g/dL (ref 3.5–5.2)
Alkaline Phosphatase: 60 U/L (ref 39–117)
BUN: 17 mg/dL (ref 6–23)
CO2: 27 mEq/L (ref 19–32)
Calcium: 10.1 mg/dL (ref 8.4–10.5)
Chloride: 105 mEq/L (ref 96–112)
Creat: 0.82 mg/dL (ref 0.50–1.10)
Glucose, Bld: 101 mg/dL — ABNORMAL HIGH (ref 70–99)
Potassium: 4.6 mEq/L (ref 3.5–5.3)
Sodium: 141 mEq/L (ref 135–145)
Total Bilirubin: 0.4 mg/dL (ref 0.3–1.2)
Total Protein: 7.7 g/dL (ref 6.0–8.3)

## 2013-05-17 LAB — TSH: TSH: 7.547 u[IU]/mL — ABNORMAL HIGH (ref 0.350–4.500)

## 2013-05-17 LAB — CBC WITH DIFFERENTIAL/PLATELET
Basophils Absolute: 0 10*3/uL (ref 0.0–0.1)
Basophils Relative: 0 % (ref 0–1)
Eosinophils Absolute: 0.3 10*3/uL (ref 0.0–0.7)
Eosinophils Relative: 3 % (ref 0–5)
HCT: 37.8 % (ref 36.0–46.0)
Hemoglobin: 13 g/dL (ref 12.0–15.0)
Lymphocytes Relative: 27 % (ref 12–46)
Lymphs Abs: 2 10*3/uL (ref 0.7–4.0)
MCH: 31.7 pg (ref 26.0–34.0)
MCHC: 34.4 g/dL (ref 30.0–36.0)
MCV: 92.2 fL (ref 78.0–100.0)
Monocytes Absolute: 0.7 10*3/uL (ref 0.1–1.0)
Monocytes Relative: 9 % (ref 3–12)
Neutro Abs: 4.5 10*3/uL (ref 1.7–7.7)
Neutrophils Relative %: 61 % (ref 43–77)
Platelets: 305 10*3/uL (ref 150–400)
RBC: 4.1 MIL/uL (ref 3.87–5.11)
RDW: 13.4 % (ref 11.5–15.5)
WBC: 7.5 10*3/uL (ref 4.0–10.5)

## 2013-05-17 LAB — LIPID PANEL
Cholesterol: 187 mg/dL (ref 0–200)
HDL: 67 mg/dL (ref >39)
LDL Cholesterol: 105 mg/dL — ABNORMAL HIGH (ref 0–99)
Total CHOL/HDL Ratio: 2.8 Ratio
Triglycerides: 75 mg/dL (ref <150)
VLDL: 15 mg/dL (ref 0–40)

## 2013-05-17 LAB — HEMOGLOBIN A1C
Hgb A1c MFr Bld: 5.6 % (ref <5.7)
Mean Plasma Glucose: 114 mg/dL (ref <117)

## 2013-05-17 MED ORDER — TIZANIDINE HCL 4 MG PO TABS: ORAL_TABLET | ORAL | Status: DC

## 2013-05-17 MED ORDER — METHYLPREDNISOLONE ACETATE 80 MG/ML IJ SUSP
80.0000 mg | Freq: Once | INTRAMUSCULAR | Status: AC
  Administered 2013-05-17: 80 mg via INTRAMUSCULAR

## 2013-05-17 MED ORDER — PREDNISONE 5 MG PO TABS: ORAL_TABLET | ORAL | Status: AC

## 2013-05-17 MED ORDER — KETOROLAC TROMETHAMINE 60 MG/2ML IM SOLN
60.0000 mg | Freq: Once | INTRAMUSCULAR | Status: AC
  Administered 2013-05-17: 60 mg via INTRAMUSCULAR

## 2013-05-17 NOTE — Assessment & Plan Note (Signed)
Acute back pain with spasm, anti inflammatories in ofollowed by oral meds and muscle relaxants

## 2013-05-17 NOTE — Assessment & Plan Note (Signed)
Intermittent right jaw pain and sp[pasm,. Has benefiited in the past from muscle relaxant requests same

## 2013-05-17 NOTE — Progress Notes (Signed)
  Subjective:    Patient ID: Crist Fat, female    DOB: 05-Mar-1951, 62 y.o.   MRN: [id number]  HPI  The PT is here for follow up and re-evaluation of chronic medical conditions, medication management and review of any available recent lab and radiology data.  Preventive health is updated, specifically  Cancer screening and Immunization.   Questions or concerns regarding consultations or procedures which the PT has had in the interim are  Addressed.Vision is deteriorating , states she has been told that nothing will help The PT denies any adverse reactions to current medications since the last visit.  C/o increased back pain and spasm in the past 1 to 2 weeks, no inciting factor, requests injection for same and medication    Review of Systems See HPI Denies recent fever or chills. Denies sinus pressure, nasal congestion, ear pain or sore throat. Denies chest congestion, productive cough or wheezing. Denies chest pains, palpitations and leg swelling Denies abdominal pain, nausea, vomiting,diarrhea or constipation.   Denies dysuria, frequency, hesitancy or incontinence. Denies headaches, seizures, numbness, or tingling. Denies depression, anxiety or insomnia. Denies skin break down or rash.        Objective:   Physical Exam  Patient alert and oriented and in no cardiopulmonary distress.  HEENT: No facial asymmetry, EOMI, no sinus tenderness,  oropharynx pink and moist.  Neck supple no adenopathy.goiter  Chest: Clear to auscultation bilaterally.  CVS: S1, S2 no murmurs, no S3.  ABD: Soft non tender. Bowel sounds normal.  Ext: No edema  MS: decreased  ROM spine,adequate in  shoulders, hips and knees.  Skin: Intact, no ulcerations or rash noted.  Psych: Good eye contact, normal affect. Memory intact not anxious or depressed appearing.  CNS: CN 2-12 intact, power, tone and sensation normal throughout.       Assessment & Plan:

## 2013-05-17 NOTE — Assessment & Plan Note (Signed)
Controlled, no change in medication DASH diet and commitment to daily physical activity for a minimum of 30 minutes discussed and encouraged, as a part of hypertension management. The importance of attaining a healthy weight is also discussed.  

## 2013-05-17 NOTE — Assessment & Plan Note (Signed)
Hyperlipidemia:Low fat diet discussed and encouraged.  updated lab today

## 2013-05-17 NOTE — Patient Instructions (Addendum)
F/u in 5 month  Toradol 60 mg IM and depomedrol 80mg  im in the office today.Followed by prednisone for 5 days New is a muscle relaxant at bedtime for back pain and spasm  STOP CAKES, COOKIES and SUGAR so you prevent  Diabetes  hBA1C cmp , lipids , cbc and TSH today  We are checking into possibility of an eye exam for you and will let you know    Muscle relaxant sent in   Mammogram is due in July, we will schedule if you wish  Check your pharmacy for the shingles vaccine you need this

## 2013-05-17 NOTE — Assessment & Plan Note (Signed)
Pt advised to reduce blood sugar and carbohydrate intake and to work on weight loss

## 2013-05-17 NOTE — Assessment & Plan Note (Signed)
Unchanged. Patient re-educated about  the importance of commitment to a  minimum of 150 minutes of exercise per week. The importance of healthy food choices with portion control discussed. Encouraged to start a food diary, count calories and to consider  joining a support group. Sample diet sheets offered. Goals set by the patient for the next several months.    

## 2013-05-18 ENCOUNTER — Other Ambulatory Visit: Payer: Self-pay | Admitting: Family Medicine

## 2013-05-18 DIAGNOSIS — R7989 Other specified abnormal findings of blood chemistry: Secondary | ICD-10-CM

## 2013-05-18 LAB — T4, FREE: Free T4: 1.22 ng/dL (ref 0.80–1.80)

## 2013-05-18 LAB — T3, FREE: T3, Free: 3 pg/mL (ref 2.3–4.2)

## 2013-05-19 ENCOUNTER — Telehealth: Payer: Self-pay | Admitting: Family Medicine

## 2013-05-20 NOTE — Telephone Encounter (Signed)
Patient is aware 

## 2013-05-24 ENCOUNTER — Other Ambulatory Visit: Payer: Self-pay | Admitting: Family Medicine

## 2013-05-24 ENCOUNTER — Ambulatory Visit (HOSPITAL_COMMUNITY)
Admission: RE | Admit: 2013-05-24 | Discharge: 2013-05-24 | Disposition: A | Discharge status: Home / Self Care | Payer: Medicaid Other | Source: Ambulatory Visit | Attending: Family Medicine | Admitting: Family Medicine

## 2013-05-24 DIAGNOSIS — R946 Abnormal results of thyroid function studies: Secondary | ICD-10-CM | POA: Insufficient documentation

## 2013-05-24 DIAGNOSIS — E049 Nontoxic goiter, unspecified: Secondary | ICD-10-CM | POA: Insufficient documentation

## 2013-05-24 DIAGNOSIS — R7989 Other specified abnormal findings of blood chemistry: Secondary | ICD-10-CM

## 2013-05-24 DIAGNOSIS — E042 Nontoxic multinodular goiter: Secondary | ICD-10-CM

## 2013-05-29 DIAGNOSIS — E049 Nontoxic goiter, unspecified: Secondary | ICD-10-CM | POA: Insufficient documentation

## 2013-05-29 DIAGNOSIS — H547 Unspecified visual loss: Secondary | ICD-10-CM | POA: Insufficient documentation

## 2013-05-29 NOTE — Assessment & Plan Note (Signed)
ENT to evaluate, based on US biopsy recommended

## 2013-06-10 ENCOUNTER — Ambulatory Visit (HOSPITAL_COMMUNITY)
Admission: RE | Admit: 2013-06-10 | Discharge: 2013-06-10 | Disposition: A | Discharge status: Home / Self Care | Payer: Medicaid Other | Source: Ambulatory Visit | Attending: Family Medicine | Admitting: Family Medicine

## 2013-06-10 ENCOUNTER — Ambulatory Visit (INDEPENDENT_AMBULATORY_CARE_PROVIDER_SITE_OTHER): Payer: Medicaid Other | Admitting: Otolaryngology

## 2013-06-10 DIAGNOSIS — D449 Neoplasm of uncertain behavior of unspecified endocrine gland: Secondary | ICD-10-CM

## 2013-06-10 DIAGNOSIS — Z1231 Encounter for screening mammogram for malignant neoplasm of breast: Secondary | ICD-10-CM | POA: Insufficient documentation

## 2013-06-10 DIAGNOSIS — Z139 Encounter for screening, unspecified: Secondary | ICD-10-CM

## 2013-06-14 ENCOUNTER — Other Ambulatory Visit (INDEPENDENT_AMBULATORY_CARE_PROVIDER_SITE_OTHER): Payer: Self-pay | Admitting: Otolaryngology

## 2013-06-14 DIAGNOSIS — E041 Nontoxic single thyroid nodule: Secondary | ICD-10-CM

## 2013-06-22 ENCOUNTER — Other Ambulatory Visit (INDEPENDENT_AMBULATORY_CARE_PROVIDER_SITE_OTHER): Payer: Self-pay | Admitting: Otolaryngology

## 2013-06-22 ENCOUNTER — Ambulatory Visit (HOSPITAL_COMMUNITY)
Admission: RE | Admit: 2013-06-22 | Discharge: 2013-06-22 | Disposition: A | Discharge status: Home / Self Care | Payer: Medicaid Other | Source: Ambulatory Visit | Attending: Otolaryngology | Admitting: Otolaryngology

## 2013-06-22 ENCOUNTER — Other Ambulatory Visit (HOSPITAL_COMMUNITY): Payer: Medicaid Other

## 2013-06-22 DIAGNOSIS — E041 Nontoxic single thyroid nodule: Secondary | ICD-10-CM

## 2013-06-22 MED ORDER — LIDOCAINE HCL (PF) 2 % IJ SOLN
INTRAMUSCULAR | Status: AC
  Filled 2013-06-22: qty 10

## 2013-06-22 NOTE — Progress Notes (Signed)
Pt for thyroid biopsy cancelled by radiologist and follow up ultrasound recommended.

## 2013-07-07 ENCOUNTER — Other Ambulatory Visit: Payer: Self-pay | Admitting: Family Medicine

## 2013-07-20 ENCOUNTER — Other Ambulatory Visit: Payer: Self-pay | Admitting: Family Medicine

## 2013-09-17 ENCOUNTER — Other Ambulatory Visit: Payer: Self-pay | Admitting: Family Medicine

## 2013-10-18 ENCOUNTER — Ambulatory Visit (INDEPENDENT_AMBULATORY_CARE_PROVIDER_SITE_OTHER): Payer: Medicaid Other | Admitting: Family Medicine

## 2013-10-18 ENCOUNTER — Telehealth: Payer: Self-pay | Admitting: Family Medicine

## 2013-10-18 ENCOUNTER — Encounter: Payer: Self-pay | Admitting: Family Medicine

## 2013-10-18 ENCOUNTER — Encounter (INDEPENDENT_AMBULATORY_CARE_PROVIDER_SITE_OTHER): Payer: Self-pay

## 2013-10-18 VITALS — BP 130/84 | HR 79 | Resp 16 | Ht 67.0 in | Wt 213.0 lb

## 2013-10-18 DIAGNOSIS — E8881 Metabolic syndrome: Secondary | ICD-10-CM | POA: Insufficient documentation

## 2013-10-18 DIAGNOSIS — M51379 Other intervertebral disc degeneration, lumbosacral region without mention of lumbar back pain or lower extremity pain: Secondary | ICD-10-CM

## 2013-10-18 DIAGNOSIS — M549 Dorsalgia, unspecified: Secondary | ICD-10-CM

## 2013-10-18 DIAGNOSIS — R7303 Prediabetes: Secondary | ICD-10-CM

## 2013-10-18 DIAGNOSIS — I10 Essential (primary) hypertension: Secondary | ICD-10-CM

## 2013-10-18 DIAGNOSIS — E785 Hyperlipidemia, unspecified: Secondary | ICD-10-CM

## 2013-10-18 DIAGNOSIS — R7309 Other abnormal glucose: Secondary | ICD-10-CM

## 2013-10-18 DIAGNOSIS — E042 Nontoxic multinodular goiter: Secondary | ICD-10-CM

## 2013-10-18 DIAGNOSIS — J45991 Cough variant asthma: Secondary | ICD-10-CM

## 2013-10-18 DIAGNOSIS — M5137 Other intervertebral disc degeneration, lumbosacral region: Secondary | ICD-10-CM

## 2013-10-18 DIAGNOSIS — Z23 Encounter for immunization: Secondary | ICD-10-CM

## 2013-10-18 DIAGNOSIS — M899 Disorder of bone, unspecified: Secondary | ICD-10-CM

## 2013-10-18 DIAGNOSIS — E669 Obesity, unspecified: Secondary | ICD-10-CM

## 2013-10-18 MED ORDER — CETIRIZINE HCL 10 MG PO TABS: ORAL_TABLET | ORAL | Status: DC

## 2013-10-18 MED ORDER — PREDNISONE 5 MG PO TABS: 5.0000 mg | ORAL_TABLET | Freq: Two times a day (BID) | ORAL | Status: AC

## 2013-10-18 MED ORDER — FLUTICASONE PROPIONATE 50 MCG/ACT NA SUSP: 1.0000 | Freq: Every day | NASAL | Status: DC

## 2013-10-18 MED ORDER — BENZONATATE 100 MG PO CAPS: 100.0000 mg | ORAL_CAPSULE | Freq: Four times a day (QID) | ORAL | Status: DC | PRN

## 2013-10-18 MED ORDER — TRIAMTERENE-HCTZ 37.5-25 MG PO TABS: ORAL_TABLET | ORAL | Status: DC

## 2013-10-18 MED ORDER — AMLODIPINE BESYLATE 10 MG PO TABS: ORAL_TABLET | ORAL | Status: DC

## 2013-10-18 MED ORDER — METHYLPREDNISOLONE ACETATE 80 MG/ML IJ SUSP
80.0000 mg | Freq: Once | INTRAMUSCULAR | Status: AC
  Administered 2013-10-18: 80 mg via INTRAMUSCULAR

## 2013-10-18 MED ORDER — LOVASTATIN 40 MG PO TABS: ORAL_TABLET | ORAL | Status: DC

## 2013-10-18 MED ORDER — KETOROLAC TROMETHAMINE 60 MG/2ML IM SOLN
60.0000 mg | Freq: Once | INTRAMUSCULAR | Status: AC
  Administered 2013-10-18: 60 mg via INTRAMUSCULAR

## 2013-10-18 NOTE — Assessment & Plan Note (Signed)
Controlled, no change in medication DASH diet and commitment to daily physical activity for a minimum of 30 minutes discussed and encouraged, as a part of hypertension management. The importance of attaining a healthy weight is also discussed.  

## 2013-10-18 NOTE — Assessment & Plan Note (Addendum)
Increased wheezing in the Winter, uses rescue about twice  Per week States tessalon perles helps Refer for PFT

## 2013-10-18 NOTE — Assessment & Plan Note (Signed)
Unchanged. Patient re-educated about  the importance of commitment to a  minimum of 150 minutes of exercise per week. The importance of healthy food choices with portion control discussed. Encouraged to start a food diary, count calories and to consider  joining a support group. Sample diet sheets offered. Goals set by the patient for the next several months.    

## 2013-10-18 NOTE — Assessment & Plan Note (Signed)
Updated labs in 3 month Patient educated about the importance of limiting  Carbohydrate intake , the need to commit to daily physical activity for a minimum of 30 minutes , and to commit weight loss. The fact that changes in all these areas will reduce or eliminate all together the development of diabetes is stressed.

## 2013-10-18 NOTE — Assessment & Plan Note (Signed)
Elevated LDL Hyperlipidemia:Low fat diet discussed and encouraged.  Updated lab in 3 month

## 2013-10-18 NOTE — Progress Notes (Signed)
  Subjective:    Patient ID: [name], female    DOB: 11/01/1951, 62 y.o.   MRN: [id number]  HPI  The PT is here for follow up and re-evaluation of chronic medical conditions, medication management and review of any available recent lab and radiology data.  Preventive health is updated, specifically  Cancer screening and Immunization.   Questions or concerns regarding consultations or procedures which the PT has had in the interim are  addressed. The PT denies any adverse reactions to current medications since the last visit.  There are no new concerns.  There are no specific complaints      Review of Systems See HPI Denies recent fever or chills. Denies sinus pressure, nasal congestion, ear pain or sore throat. C/o chronic dry cough , wheeze and shortness of breath. Denies chest pains, palpitations and leg swelling Denies abdominal pain, nausea, vomiting,diarrhea or constipation.   Denies dysuria, frequency, hesitancy or incontinence. Denies headaches, seizures, numbness, or tingling. Denies depression, anxiety or insomnia. Denies skin break down or rash.        Objective:   Physical Exam  Patient alert and oriented and in no cardiopulmonary distress.  HEENT: No facial asymmetry, EOMI, no sinus tenderness,  oropharynx pink and moist.  Neck supple no adenopathy.  Chest: Clear to auscultation bilaterally.Decreased though adequate air entry throughout  CVS: S1, S2 no murmurs, no S3.  ABD: Soft non tender. Bowel sounds normal.  Ext: No edema  MS: Decreased  ROM spine,  hips and knees.  Skin: Intact, no ulcerations or rash noted.  Psych: Good eye contact, normal affect. Memory intact not anxious or depressed appearing.  CNS: CN 2-12 intact, power,  normal throughout.       Assessment & Plan:

## 2013-10-18 NOTE — Patient Instructions (Addendum)
CPE and pap in 4 month, call if you need me before  Toradol 60mg  IM and depo medrol 80mg  IM in office today, followed by prednisone for 5 days,   You are referrerd for lung function test, stop at front for appt info please, tessalon perles sent for cough  Flu vaccine today  Fasting lipid, cmp, hBA1C, TSH and vit D in March 2015, before visit

## 2013-10-19 NOTE — Telephone Encounter (Signed)
Patient is aware 

## 2013-11-02 ENCOUNTER — Ambulatory Visit (HOSPITAL_COMMUNITY): Admission: RE | Admit: 2013-11-02 | Payer: Medicaid Other | Source: Ambulatory Visit

## 2013-11-17 ENCOUNTER — Ambulatory Visit (HOSPITAL_COMMUNITY)
Admission: RE | Admit: 2013-11-17 | Discharge: 2013-11-17 | Disposition: A | Discharge status: Home / Self Care | Payer: Medicaid Other | Source: Ambulatory Visit | Attending: Family Medicine | Admitting: Family Medicine

## 2013-11-17 DIAGNOSIS — R05 Cough: Secondary | ICD-10-CM | POA: Insufficient documentation

## 2013-11-17 DIAGNOSIS — R059 Cough, unspecified: Secondary | ICD-10-CM | POA: Insufficient documentation

## 2013-11-17 DIAGNOSIS — J45991 Cough variant asthma: Secondary | ICD-10-CM

## 2013-11-17 DIAGNOSIS — J45909 Unspecified asthma, uncomplicated: Secondary | ICD-10-CM | POA: Insufficient documentation

## 2013-11-17 LAB — PULMONARY FUNCTION TEST
RV % pred: 115 %
RV: 2.55 L
TLC % pred: 92 %
TLC: 5.11 L

## 2013-11-22 NOTE — Assessment & Plan Note (Signed)
Increased CV risk discussed and the need to work on this with dietary change and weight loss

## 2013-11-22 NOTE — Assessment & Plan Note (Signed)
uncontrolled , toradol and depo medrol in office

## 2014-02-14 ENCOUNTER — Encounter (INDEPENDENT_AMBULATORY_CARE_PROVIDER_SITE_OTHER): Payer: Self-pay

## 2014-02-14 ENCOUNTER — Other Ambulatory Visit (HOSPITAL_COMMUNITY)
Admission: RE | Admit: 2014-02-14 | Discharge: 2014-02-14 | Disposition: A | Discharge status: Home / Self Care | Payer: Medicaid Other | Source: Ambulatory Visit | Attending: Family Medicine | Admitting: Family Medicine

## 2014-02-14 ENCOUNTER — Ambulatory Visit (INDEPENDENT_AMBULATORY_CARE_PROVIDER_SITE_OTHER): Payer: Medicaid Other | Admitting: Family Medicine

## 2014-02-14 ENCOUNTER — Encounter: Payer: Self-pay | Admitting: Family Medicine

## 2014-02-14 VITALS — BP 120/82 | HR 82 | Resp 16 | Ht 67.0 in | Wt 208.0 lb

## 2014-02-14 DIAGNOSIS — Z1272 Encounter for screening for malignant neoplasm of vagina: Secondary | ICD-10-CM

## 2014-02-14 DIAGNOSIS — H9201 Otalgia, right ear: Secondary | ICD-10-CM

## 2014-02-14 DIAGNOSIS — H9209 Otalgia, unspecified ear: Secondary | ICD-10-CM

## 2014-02-14 DIAGNOSIS — Z01419 Encounter for gynecological examination (general) (routine) without abnormal findings: Secondary | ICD-10-CM | POA: Insufficient documentation

## 2014-02-14 DIAGNOSIS — Z1211 Encounter for screening for malignant neoplasm of colon: Secondary | ICD-10-CM

## 2014-02-14 DIAGNOSIS — Z0000 Encounter for general adult medical examination without abnormal findings: Secondary | ICD-10-CM | POA: Insufficient documentation

## 2014-02-14 DIAGNOSIS — Z1151 Encounter for screening for human papillomavirus (HPV): Secondary | ICD-10-CM | POA: Insufficient documentation

## 2014-02-14 LAB — LIPID PANEL
Cholesterol: 166 mg/dL (ref 0–200)
HDL: 50 mg/dL (ref >39)
LDL Cholesterol: 96 mg/dL (ref 0–99)
Total CHOL/HDL Ratio: 3.3 Ratio
Triglycerides: 98 mg/dL (ref <150)
VLDL: 20 mg/dL (ref 0–40)

## 2014-02-14 LAB — TSH: TSH: 4.292 u[IU]/mL (ref 0.350–4.500)

## 2014-02-14 LAB — HEMOGLOBIN A1C
Hgb A1c MFr Bld: 6 % — ABNORMAL HIGH (ref <5.7)
Mean Plasma Glucose: 126 mg/dL — ABNORMAL HIGH (ref <117)

## 2014-02-14 LAB — COMPREHENSIVE METABOLIC PANEL
ALT: 29 U/L (ref 0–35)
AST: 23 U/L (ref 0–37)
Albumin: 4 g/dL (ref 3.5–5.2)
Alkaline Phosphatase: 55 U/L (ref 39–117)
BUN: 15 mg/dL (ref 6–23)
CO2: 30 mEq/L (ref 19–32)
Calcium: 9.8 mg/dL (ref 8.4–10.5)
Chloride: 100 mEq/L (ref 96–112)
Creat: 0.85 mg/dL (ref 0.50–1.10)
Glucose, Bld: 116 mg/dL — ABNORMAL HIGH (ref 70–99)
Potassium: 3.5 mEq/L (ref 3.5–5.3)
Sodium: 139 mEq/L (ref 135–145)
Total Bilirubin: 0.4 mg/dL (ref 0.2–1.2)
Total Protein: 7.3 g/dL (ref 6.0–8.3)

## 2014-02-14 LAB — POC HEMOCCULT BLD/STL (OFFICE/1-CARD/DIAGNOSTIC): Fecal Occult Blood, POC: NEGATIVE

## 2014-02-14 MED ORDER — CETIRIZINE HCL 10 MG PO TABS: ORAL_TABLET | ORAL | Status: DC

## 2014-02-14 MED ORDER — SULFAMETHOXAZOLE-TRIMETHOPRIM 800-160 MG PO TABS: 1.0000 | ORAL_TABLET | Freq: Two times a day (BID) | ORAL | Status: DC

## 2014-02-14 MED ORDER — NABUMETONE 750 MG PO TABS: ORAL_TABLET | ORAL | Status: DC

## 2014-02-14 NOTE — Patient Instructions (Signed)
F/u in mid September, call if you need me before  Fasting labs today as previously ordered  PLEASE reschedule your thyroid ultrasound , this is important  Fall Prevention and Home Safety Falls cause injuries and can affect all age groups. It is possible to prevent falls.  HOW TO PREVENT FALLS  Wear shoes with rubber soles that do not have an opening for your toes.  Keep the inside and outside of your house well lit.  Use night lights throughout your home.  Remove clutter from floors.  Clean up floor spills.  Remove throw rugs or fasten them to the floor with carpet tape.  Do not place electrical cords across pathways.  Put grab bars by your tub, shower, and toilet. Do not use towel bars as grab bars.  Put handrails on both sides of the stairway. Fix loose handrails.  Do not climb on stools or stepladders, if possible.  Do not wax your floors.  Repair uneven or unsafe sidewalks, walkways, or stairs.  Keep items you use a lot within reach.  Be aware of pets.  Keep emergency numbers next to the telephone.  Put smoke detectors in your home and near bedrooms. Ask your doctor what other things you can do to prevent falls. Document Released: 09/21/2009 Document Revised: 05/26/2012 Document Reviewed: 02/25/2012 [facility] Patient Information 2014 [place], [place].

## 2014-02-14 NOTE — Assessment & Plan Note (Signed)
Right ear infection present , 1week antibiotic course prescribed

## 2014-02-14 NOTE — Assessment & Plan Note (Signed)
Annual exam as documented. Counseling done  re healthy lifestyle involving commitment to 150 minutes exercise per week, heart healthy diet, and attaining healthy weight.The importance of adequate sleep also discussed. Regular seat belt use and safe storage  of firearms if patient has them, is also discussed. Changes in health habits are decided on by the patient with goals and time frames  set for achieving them. Immunization and cancer screening needs are specifically addressed at this visit.  

## 2014-02-14 NOTE — Progress Notes (Signed)
   Subjective:    Patient ID: [name], female    DOB: [birth year], 63 y.o.   MRN: [id number]  HPI The patient is for an annual exam. Health maintenance is reviewed and updated, specifically  recommended,  screening tests and immunizations. Recent lab and radiologic data is reviewed also.Needs labs, and she needs to reschedule f/u ultrasound of her thyroid gland No significant back and hip pain today   Review of Systems See HPI Denies recent fever or chills. Denies sinus pressure, nasal congestion, or sore throat.Right ear pain x 1 week with mild vertigo Denies chest congestion, productive cough or wheezing. Denies chest pains, palpitations and leg swelling Denies abdominal pain, nausea, vomiting,diarrhea or constipation.   Denies dysuria, frequency, hesitancy or incontinence.  Denies headaches, seizures, numbness, or tingling. Denies depression, anxiety or insomnia. Denies skin break down or rash.        Objective:   Physical Exam BP 120/82  Pulse 82  Resp 16  Ht 5\' 7"  (1.702 m)  Wt 208 lb (94.348 kg)  BMI 32.57 kg/m2  SpO2 96% Pleasant obese female, alert and oriented x 3, in no cardio-pulmonary distress. Afebrile. HEENT No facial trauma or asymetry. Sinuses non tender.  EOMI, PERTL, .  External ears normal,left  tympanic membrane clear, right TM mildly erythematous with reduced lightreflex Oropharynx moist, no exudate, poor dentition. Neck: supple, no adenopathy,JVD or thyromegaly.No bruits.  Chest: Clear to ascultation bilaterally.No crackles or wheezes. Non tender to palpation  Breast: No asymetry,no masses. No nipple discharge or inversion. No axillary or supraclavicular adenopathy  Cardiovascular system; Heart sounds normal,  S1 and  S2 ,no S3.  No murmur, or thrill. Apical beat not displaced Peripheral pulses normal.  Abdomen: Soft, non tender, no organomegaly or masses. No bruits. Bowel sounds normal. No guarding, tenderness or  rebound.  Rectal:  No mass. Guaiac negative stool.  GU: External genitalia normal. No lesions. Vaginal canal normal.No discharge. Uterusabsent, no adnexal masses, no adnexal tenderness.  Musculoskeletal exam: Decreased though adequate ROM of spine, hips , shoulders and knees. No deformity ,swelling or crepitus noted. No muscle wasting or atrophy.   Neurologic: Cranial nerves 2 to 12 intact.reduced vision Power, tone ,sensation and reflexes normal throughout. No disturbance in gait. No tremor.  Skin: Intact, no ulceration, erythema , scaling or rash noted. Pigmentation normal throughout  Psych; Normal mood and affect. Judgement and concentration normal       Assessment & Plan:  Otalgia of right ear Right ear infection present , 1week antibiotic course prescribed  Routine general medical examination at a health care facility Annual exam as documented. Counseling done  re healthy lifestyle involving commitment to 150 minutes exercise per week, heart healthy diet, and attaining healthy weight.The importance of adequate sleep also discussed. Regular seat belt use and safe storage  of firearms if patient has them, is also discussed. Changes in health habits are decided on by the patient with goals and time frames  set for achieving them. Immunization and cancer screening needs are specifically addressed at this visit.

## 2014-02-15 LAB — VITAMIN D 25 HYDROXY (VIT D DEFICIENCY, FRACTURES): Vit D, 25-Hydroxy: 57 ng/mL (ref 30–89)

## 2014-02-18 ENCOUNTER — Other Ambulatory Visit: Payer: Self-pay | Admitting: Family Medicine

## 2014-04-07 ENCOUNTER — Other Ambulatory Visit: Payer: Self-pay | Admitting: Family Medicine

## 2014-05-23 ENCOUNTER — Other Ambulatory Visit: Payer: Self-pay | Admitting: Family Medicine

## 2014-05-23 DIAGNOSIS — Z1231 Encounter for screening mammogram for malignant neoplasm of breast: Secondary | ICD-10-CM

## 2014-05-25 ENCOUNTER — Other Ambulatory Visit: Payer: Self-pay | Admitting: Family Medicine

## 2014-06-13 ENCOUNTER — Ambulatory Visit (HOSPITAL_COMMUNITY)
Admission: RE | Admit: 2014-06-13 | Discharge: 2014-06-13 | Disposition: A | Discharge status: Home / Self Care | Payer: Medicaid Other | Source: Ambulatory Visit | Attending: Family Medicine | Admitting: Family Medicine

## 2014-06-13 DIAGNOSIS — Z1231 Encounter for screening mammogram for malignant neoplasm of breast: Secondary | ICD-10-CM | POA: Insufficient documentation

## 2014-07-08 ENCOUNTER — Other Ambulatory Visit: Payer: Self-pay | Admitting: Family Medicine

## 2014-07-22 ENCOUNTER — Other Ambulatory Visit: Payer: Self-pay | Admitting: Family Medicine

## 2014-08-25 ENCOUNTER — Ambulatory Visit (INDEPENDENT_AMBULATORY_CARE_PROVIDER_SITE_OTHER): Payer: Medicaid Other | Admitting: Family Medicine

## 2014-08-25 ENCOUNTER — Encounter: Payer: Self-pay | Admitting: Family Medicine

## 2014-08-25 ENCOUNTER — Encounter (INDEPENDENT_AMBULATORY_CARE_PROVIDER_SITE_OTHER): Payer: Self-pay

## 2014-08-25 VITALS — BP 120/82 | HR 66 | Resp 18 | Ht 67.0 in | Wt 205.0 lb

## 2014-08-25 DIAGNOSIS — E8881 Metabolic syndrome: Secondary | ICD-10-CM

## 2014-08-25 DIAGNOSIS — E785 Hyperlipidemia, unspecified: Secondary | ICD-10-CM

## 2014-08-25 DIAGNOSIS — E042 Nontoxic multinodular goiter: Secondary | ICD-10-CM

## 2014-08-25 DIAGNOSIS — I10 Essential (primary) hypertension: Secondary | ICD-10-CM

## 2014-08-25 DIAGNOSIS — M25559 Pain in unspecified hip: Secondary | ICD-10-CM

## 2014-08-25 DIAGNOSIS — R7303 Prediabetes: Secondary | ICD-10-CM

## 2014-08-25 DIAGNOSIS — M25551 Pain in right hip: Secondary | ICD-10-CM

## 2014-08-25 DIAGNOSIS — R7309 Other abnormal glucose: Secondary | ICD-10-CM

## 2014-08-25 DIAGNOSIS — E559 Vitamin D deficiency, unspecified: Secondary | ICD-10-CM

## 2014-08-25 DIAGNOSIS — E669 Obesity, unspecified: Secondary | ICD-10-CM

## 2014-08-25 DIAGNOSIS — M5137 Other intervertebral disc degeneration, lumbosacral region: Secondary | ICD-10-CM

## 2014-08-25 DIAGNOSIS — M549 Dorsalgia, unspecified: Secondary | ICD-10-CM

## 2014-08-25 DIAGNOSIS — Z23 Encounter for immunization: Secondary | ICD-10-CM | POA: Diagnosis not present

## 2014-08-25 LAB — COMPLETE METABOLIC PANEL WITH GFR
ALT: 26 U/L (ref 0–35)
AST: 25 U/L (ref 0–37)
Albumin: 4.1 g/dL (ref 3.5–5.2)
Alkaline Phosphatase: 59 U/L (ref 39–117)
BUN: 17 mg/dL (ref 6–23)
CO2: 29 mEq/L (ref 19–32)
Calcium: 9.9 mg/dL (ref 8.4–10.5)
Chloride: 105 mEq/L (ref 96–112)
Creat: 0.77 mg/dL (ref 0.50–1.10)
GFR, Est African American: >89 mL/min
GFR, Est Non African American: 82 mL/min
Glucose, Bld: 87 mg/dL (ref 70–99)
Potassium: 4.4 mEq/L (ref 3.5–5.3)
Sodium: 142 mEq/L (ref 135–145)
Total Bilirubin: 0.4 mg/dL (ref 0.2–1.2)
Total Protein: 7.3 g/dL (ref 6.0–8.3)

## 2014-08-25 LAB — CBC WITH DIFFERENTIAL/PLATELET
Basophils Absolute: 0.1 10*3/uL (ref 0.0–0.1)
Basophils Relative: 1 % (ref 0–1)
Eosinophils Absolute: 0.2 10*3/uL (ref 0.0–0.7)
Eosinophils Relative: 3 % (ref 0–5)
HCT: 37.5 % (ref 36.0–46.0)
Hemoglobin: 12.6 g/dL (ref 12.0–15.0)
Lymphocytes Relative: 26 % (ref 12–46)
Lymphs Abs: 1.5 10*3/uL (ref 0.7–4.0)
MCH: 31.4 pg (ref 26.0–34.0)
MCHC: 33.6 g/dL (ref 30.0–36.0)
MCV: 93.5 fL (ref 78.0–100.0)
Monocytes Absolute: 0.4 10*3/uL (ref 0.1–1.0)
Monocytes Relative: 7 % (ref 3–12)
Neutro Abs: 3.7 10*3/uL (ref 1.7–7.7)
Neutrophils Relative %: 63 % (ref 43–77)
Platelets: 271 10*3/uL (ref 150–400)
RBC: 4.01 MIL/uL (ref 3.87–5.11)
RDW: 13.8 % (ref 11.5–15.5)
WBC: 5.9 10*3/uL (ref 4.0–10.5)

## 2014-08-25 LAB — LIPID PANEL
Cholesterol: 163 mg/dL (ref 0–200)
HDL: 73 mg/dL (ref >39)
LDL Cholesterol: 72 mg/dL (ref 0–99)
Total CHOL/HDL Ratio: 2.2 Ratio
Triglycerides: 91 mg/dL (ref <150)
VLDL: 18 mg/dL (ref 0–40)

## 2014-08-25 LAB — HEMOGLOBIN A1C
Hgb A1c MFr Bld: 6 % — ABNORMAL HIGH (ref <5.7)
Mean Plasma Glucose: 126 mg/dL — ABNORMAL HIGH (ref <117)

## 2014-08-25 MED ORDER — TIZANIDINE HCL 4 MG PO TABS: ORAL_TABLET | ORAL | Status: AC

## 2014-08-25 MED ORDER — METHYLPREDNISOLONE ACETATE 80 MG/ML IJ SUSP
80.0000 mg | Freq: Once | INTRAMUSCULAR | Status: AC
  Administered 2014-08-25: 80 mg via INTRAMUSCULAR

## 2014-08-25 MED ORDER — KETOROLAC TROMETHAMINE 60 MG/2ML IM SOLN
60.0000 mg | Freq: Once | INTRAMUSCULAR | Status: AC
  Administered 2014-08-25: 60 mg via INTRAMUSCULAR

## 2014-08-25 MED ORDER — PREDNISONE (PAK) 5 MG PO TABS: 5.0000 mg | ORAL_TABLET | ORAL | Status: DC

## 2014-08-25 NOTE — Progress Notes (Signed)
   Subjective:    Patient ID: [name], female    DOB: Jul 28, 1951, 63 y.o.   MRN: [id number]  HPI The PT is here for follow up and re-evaluation of chronic medical conditions, medication management and review of any available recent lab and radiology data.  Preventive health is updated, specifically  Cancer screening and Immunization.   The PT denies any adverse reactions to current medications since the last visit.  2 week h/o increased back and right buttock , hip and lower extremity pain, requests injections in office today for this,.no recent aggravating trauma. 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 Denies sinus pressure, nasal congestion, ear pain or sore throat. Denies chest congestion, productive cough or wheezing. Denies chest pains, palpitations and leg swelling Denies abdominal pain, nausea, vomiting,diarrhea or constipation.   Denies dysuria, frequency, hesitancy or incontinence. Denies headaches, seizures, numbness, or tingling. Denies depression, anxiety or insomnia. Denies skin break down or rash.        Objective:   Physical Exam BP 120/82  Pulse 66  Resp 18  Ht 5\' 7"  (1.702 m)  Wt 205 lb (92.987 kg)  BMI 32.10 kg/m2  SpO2 99% Patient alert and oriented and in no cardiopulmonary distress.  HEENT: No facial asymmetry, EOMI,   oropharynx pink and moist.  Neck supple no JVD, no mass.  Chest: Clear to auscultation bilaterally.  CVS: S1, S2 no murmurs, no S3.Regular rate.  ABD: Soft non tender.   Ext: No edema  MS: decreased RO lumbar  spine,  And right  Hip, adequate in shoulders  and knees.  Skin: Intact, no ulcerations or rash noted.  Psych: Gnormal affect. Memory  intact not anxious or depressed appearing.  CNS: CN 3-12 intact, power,  normal throughout       Assessment & Plan:  HYPERTENSION Controlled, no change in medication   HYPERLIPIDEMIA Hyperlipidemia:Low fat diet discussed and encouraged.  Updated lab needed today  DDD (degenerative disc disease), lumbosacral Uncontrolled, toradol and depomedrol and prednisone dose pack  Need for prophylactic vaccination and inoculation against influenza Vaccine administered at visit.   Multiple thyroid nodules Updated ultrasound ordered  Prediabetes Updated lab needed at/ before next visit. Patient educated about the importance of limiting  Carbohydrate intake , the need to commit to daily physical activity as able , and to commit weight loss. The fact that changes in all these areas will reduce or eliminate all together the development of diabetes is stressed.     HIP PAIN, RIGHT Uncontrolled and increased x 4 weeks, injections in ooffice IM, toradol and depo medrol  OBESITY Improved. Pt applauded on succesful weight loss through lifestyle change, and encouraged to continue same. Weight loss goal set for the next several months.

## 2014-08-25 NOTE — Assessment & Plan Note (Signed)
Updated ultrasound ordered

## 2014-08-25 NOTE — Assessment & Plan Note (Signed)
Uncontrolled and increased x 4 weeks, injections in ooffice IM, toradol and depo medrol

## 2014-08-25 NOTE — Assessment & Plan Note (Addendum)
Hyperlipidemia:Low fat diet discussed and encouraged.  Updated lab needed today

## 2014-08-25 NOTE — Assessment & Plan Note (Signed)
Improved. Pt applauded on succesful weight loss through lifestyle change, and encouraged to continue same. Weight loss goal set for the next several months.  

## 2014-08-25 NOTE — Assessment & Plan Note (Signed)
Uncontrolled, toradol and depomedrol and prednisone dose pack

## 2014-08-25 NOTE — Patient Instructions (Signed)
F/u in 5 month, call if you need me before   Injections today for back and right leg pain, also prednisone sent to the pharmacy for 6 days.  Flu vaccine today  Labs today, CBC, vit D, hBA1C, lipid,cmp and EGFR  You are referred for US of your thyroid gland to f/u nodules  I hope you continue to keep well, and be careful not to fall!  Fall Prevention and Home Safety Falls cause injuries and can affect all age groups. It is possible to use preventive measures to significantly decrease the likelihood of falls. There are many simple measures which can make your home safer and prevent falls. OUTDOORS  Repair cracks and edges of walkways and driveways.  Remove high doorway thresholds.  Trim shrubbery on the main path into your home.  Have good outside lighting.  Clear walkways of tools, rocks, debris, and clutter.  Check that handrails are not broken and are securely fastened. Both sides of steps should have handrails.  Have leaves, snow, and ice cleared regularly.  Use sand or salt on walkways during winter months.  In the garage, clean up grease or oil spills. BATHROOM  Install night lights.  Install grab bars by the toilet and in the tub and shower.  Use non-skid mats or decals in the tub or shower.  Place a plastic non-slip stool in the shower to sit on, if needed.  Keep floors dry and clean up all water on the floor immediately.  Remove soap buildup in the tub or shower on a regular basis.  Secure bath mats with non-slip, double-sided rug tape.  Remove throw rugs and tripping hazards from the floors. BEDROOMS  Install night lights.  Make sure a bedside light is easy to reach.  Do not use oversized bedding.  Keep a telephone by your bedside.  Have a firm chair with side arms to use for getting dressed.  Remove throw rugs and tripping hazards from the floor. KITCHEN  Keep handles on pots and pans turned toward the center of the stove. Use back burners  when possible.  Clean up spills quickly and allow time for drying.  Avoid walking on wet floors.  Avoid hot utensils and knives.  Position shelves so they are not too high or low.  Place commonly used objects within easy reach.  If necessary, use a sturdy step stool with a grab bar when reaching.  Keep electrical cables out of the way.  Do not use floor polish or wax that makes floors slippery. If you must use wax, use non-skid floor wax.  Remove throw rugs and tripping hazards from the floor. STAIRWAYS  Never leave objects on stairs.  Place handrails on both sides of stairways and use them. Fix any loose handrails. Make sure handrails on both sides of the stairways are as long as the stairs.  Check carpeting to make sure it is firmly attached along stairs. Make repairs to worn or loose carpet promptly.  Avoid placing throw rugs at the top or bottom of stairways, or properly secure the rug with carpet tape to prevent slippage. Get rid of throw rugs, if possible.  Have an electrician put in a light switch at the top and bottom of the stairs. OTHER FALL PREVENTION TIPS  Wear low-heel or rubber-soled shoes that are supportive and fit well. Wear closed toe shoes.  When using a stepladder, make sure it is fully opened and both spreaders are firmly locked. Do not climb a closed stepladder.    Add color or contrast paint or tape to grab bars and handrails in your home. Place contrasting color strips on first and last steps.  Learn and use mobility aids as needed. Install an electrical emergency response system.  Turn on lights to avoid dark areas. Replace light bulbs that burn out immediately. Get light switches that glow.  Arrange furniture to create clear pathways. Keep furniture in the same place.  Firmly attach carpet with non-skid or double-sided tape.  Eliminate uneven floor surfaces.  Select a carpet pattern that does not visually hide the edge of steps.  Be aware of  all pets. OTHER HOME SAFETY TIPS  Set the water temperature for 120 F (48.8 C).  Keep emergency numbers on or near the telephone.  Keep smoke detectors on every level of the home and near sleeping areas. Document Released: 11/15/2002 Document Revised: 05/26/2012 Document Reviewed: 02/14/2012 [facility] Patient Information 2015 [place], [place]. This information is not intended to replace advice given to you by your health care provider. Make sure you discuss any questions you have with your health care provider.

## 2014-08-25 NOTE — Assessment & Plan Note (Signed)
Controlled, no change in medication  

## 2014-08-25 NOTE — Assessment & Plan Note (Signed)
Vaccine administered at visit.  

## 2014-08-25 NOTE — Assessment & Plan Note (Signed)
Updated lab needed at/ before next visit. Patient educated about the importance of limiting  Carbohydrate intake , the need to commit to daily physical activity as able , and to commit weight loss. The fact that changes in all these areas will reduce or eliminate all together the development of diabetes is stressed.

## 2014-08-26 LAB — VITAMIN D 25 HYDROXY (VIT D DEFICIENCY, FRACTURES): Vit D, 25-Hydroxy: 38 ng/mL (ref 30–89)

## 2014-09-02 ENCOUNTER — Ambulatory Visit (HOSPITAL_COMMUNITY)
Admission: RE | Admit: 2014-09-02 | Discharge: 2014-09-02 | Disposition: A | Discharge status: Home / Self Care | Payer: Medicaid Other | Source: Ambulatory Visit | Attending: Family Medicine | Admitting: Family Medicine

## 2014-09-02 DIAGNOSIS — E042 Nontoxic multinodular goiter: Secondary | ICD-10-CM | POA: Insufficient documentation

## 2014-09-07 ENCOUNTER — Other Ambulatory Visit: Payer: Self-pay | Admitting: Family Medicine

## 2014-10-07 ENCOUNTER — Other Ambulatory Visit: Payer: Self-pay | Admitting: Family Medicine

## 2014-11-07 ENCOUNTER — Other Ambulatory Visit: Payer: Self-pay | Admitting: Family Medicine

## 2014-11-21 ENCOUNTER — Other Ambulatory Visit: Payer: Self-pay | Admitting: Family Medicine

## 2014-12-22 ENCOUNTER — Other Ambulatory Visit: Payer: Self-pay | Admitting: Family Medicine

## 2015-01-06 ENCOUNTER — Other Ambulatory Visit: Payer: Self-pay | Admitting: Family Medicine

## 2015-01-16 ENCOUNTER — Ambulatory Visit (INDEPENDENT_AMBULATORY_CARE_PROVIDER_SITE_OTHER): Payer: Medicaid Other | Admitting: Family Medicine

## 2015-01-16 ENCOUNTER — Encounter: Payer: Self-pay | Admitting: Family Medicine

## 2015-01-16 VITALS — BP 120/82 | HR 73 | Resp 16 | Ht 67.0 in | Wt 204.0 lb

## 2015-01-16 DIAGNOSIS — R7303 Prediabetes: Secondary | ICD-10-CM

## 2015-01-16 DIAGNOSIS — E669 Obesity, unspecified: Secondary | ICD-10-CM

## 2015-01-16 DIAGNOSIS — M5137 Other intervertebral disc degeneration, lumbosacral region: Secondary | ICD-10-CM

## 2015-01-16 DIAGNOSIS — H547 Unspecified visual loss: Secondary | ICD-10-CM

## 2015-01-16 DIAGNOSIS — I10 Essential (primary) hypertension: Secondary | ICD-10-CM

## 2015-01-16 DIAGNOSIS — Z13820 Encounter for screening for osteoporosis: Secondary | ICD-10-CM

## 2015-01-16 DIAGNOSIS — E785 Hyperlipidemia, unspecified: Secondary | ICD-10-CM

## 2015-01-16 DIAGNOSIS — R7309 Other abnormal glucose: Secondary | ICD-10-CM

## 2015-01-16 LAB — BASIC METABOLIC PANEL
BUN: 17 mg/dL (ref 6–23)
CO2: 28 mEq/L (ref 19–32)
Calcium: 9.8 mg/dL (ref 8.4–10.5)
Chloride: 103 mEq/L (ref 96–112)
Creat: 0.77 mg/dL (ref 0.50–1.10)
Glucose, Bld: 112 mg/dL — ABNORMAL HIGH (ref 70–99)
Potassium: 4.1 mEq/L (ref 3.5–5.3)
Sodium: 141 mEq/L (ref 135–145)

## 2015-01-16 LAB — HEMOGLOBIN A1C
Hgb A1c MFr Bld: 5.8 % — ABNORMAL HIGH (ref <5.7)
Mean Plasma Glucose: 120 mg/dL — ABNORMAL HIGH (ref <117)

## 2015-01-16 MED ORDER — NABUMETONE 750 MG PO TABS: 750.0000 mg | ORAL_TABLET | Freq: Every day | ORAL | Status: DC

## 2015-01-16 MED ORDER — KETOROLAC TROMETHAMINE 60 MG/2ML IM SOLN
60.0000 mg | Freq: Once | INTRAMUSCULAR | Status: AC
  Administered 2015-01-16: 60 mg via INTRAMUSCULAR

## 2015-01-16 MED ORDER — PREDNISONE 5 MG PO TABS: 5.0000 mg | ORAL_TABLET | Freq: Two times a day (BID) | ORAL | Status: DC

## 2015-01-16 MED ORDER — LOVASTATIN 40 MG PO TABS: ORAL_TABLET | ORAL | Status: DC

## 2015-01-16 MED ORDER — AMLODIPINE BESYLATE 10 MG PO TABS: 10.0000 mg | ORAL_TABLET | Freq: Every day | ORAL | Status: DC

## 2015-01-16 MED ORDER — TRIAMTERENE-HCTZ 37.5-25 MG PO TABS: 1.0000 | ORAL_TABLET | Freq: Every day | ORAL | Status: DC

## 2015-01-16 MED ORDER — METHYLPREDNISOLONE ACETATE 80 MG/ML IJ SUSP
80.0000 mg | Freq: Once | INTRAMUSCULAR | Status: AC
  Administered 2015-01-16: 80 mg via INTRAMUSCULAR

## 2015-01-16 NOTE — Patient Instructions (Addendum)
Welcome to medicare in 4 month, call if you need me before  Toradol and depo medrol in office for pain, and 5 days prednisone prescribed  Reduce relafan to one daily use tylenol 500 mg one daily for pain   Care with falling  You are referred for a boner density scan  Labs today, hBA1C and chem 7

## 2015-01-16 NOTE — Progress Notes (Signed)
   Subjective:    Patient ID: [name], female    DOB: [birth year], 64 y.o.   MRN: [id number]  HPI The PT is here for follow up and re-evaluation of chronic medical conditions, medication management and review of any available recent lab and radiology data.  Preventive health is updated, specifically  Cancer screening and Immunization.   Questions or concerns regarding consultations or procedures which the PT has had in the interim are  addressed. The PT denies any adverse reactions to current medications since the last visit.  There are no new concerns.  C/o increased and uncontrolled groin pain in the past week, no aggravating injury C/o progressive loss of vision      Review of Systems See HPI Denies recent fever or chills. Denies sinus pressure, nasal congestion, ear pain or sore throat. Denies chest congestion, productive cough or wheezing. Denies chest pains, palpitations and leg swelling Denies abdominal pain, nausea, vomiting,diarrhea or constipation.   Denies dysuria, frequency, hesitancy or incontinence.  Denies headaches, seizures, numbness, or tingling. Denies depression, anxiety or insomnia. Denies skin break down or rash.        Objective:   Physical Exam  BP 120/82 mmHg  Pulse 73  Resp 16  Ht 5\' 7"  (1.702 m)  Wt 204 lb (92.534 kg)  BMI 31.94 kg/m2  SpO2 98% Patient alert and oriented and in no cardiopulmonary distress.  HEENT: No facial asymmetry, EOMI,   oropharynx pink and moist.  Neck supple no JVD, no mass.  Chest: Clear to auscultation bilaterally.  CVS: S1, S2 no murmurs, no S3.Regular rate.  ABD: Soft non tender.   Ext: No edema  MS: decreased  ROM lumbar  spine, and , hips and adequate in shoulders and knees.  Skin: Intact, no ulcerations or rash noted.  Psych: Good eye contact, normal affect. Memory intact not anxious or depressed appearing.  CNS: CN 2-12 intact, power,  normal throughout.no focal deficits noted.         Assessment & Plan:  Hyperlipidemia LDL goal <100 Controlled, no change in medication Hyperlipidemia:Low fat diet discussed and encouraged.  \Updated lab needed at/ before next visit.    Obesity (BMI 30-39.9) Unchanged. Patient re-educated about  the importance of commitment to a  minimum of 150 minutes of exercise per week. The importance of healthy food choices with portion control discussed. Encouraged to start a food diary, count calories and to consider  joining a support group. Sample diet sheets offered. Goals set by the patient for the next several months.      Essential hypertension Controlled, no change in medication DASH diet and commitment to daily physical activity for a minimum of 30 minutes discussed and encouraged, as a part of hypertension management. The importance of attaining a healthy weight is also discussed.    DDD (degenerative disc disease), lumbosacral Acute flare with increased groin pain, anti inflammatories in office followed by short course of prednisone   Prediabetes Improved Patient educated about the importance of limiting  Carbohydrate intake , the need to commit to daily physical activity for a minimum of 30 minutes , and to commit weight loss. The fact that changes in all these areas will reduce or eliminate all together the development of diabetes is stressed.      Loss of vision Reports ongoing worsening. Fall risk reduction discussed

## 2015-01-23 NOTE — Assessment & Plan Note (Signed)
Improved Patient educated about the importance of limiting  Carbohydrate intake , the need to commit to daily physical activity for a minimum of 30 minutes , and to commit weight loss. The fact that changes in all these areas will reduce or eliminate all together the development of diabetes is stressed.

## 2015-01-23 NOTE — Assessment & Plan Note (Signed)
Unchanged. Patient re-educated about  the importance of commitment to a  minimum of 150 minutes of exercise per week. The importance of healthy food choices with portion control discussed. Encouraged to start a food diary, count calories and to consider  joining a support group. Sample diet sheets offered. Goals set by the patient for the next several months.    

## 2015-01-23 NOTE — Assessment & Plan Note (Signed)
Reports ongoing worsening. Fall risk reduction discussed

## 2015-01-23 NOTE — Assessment & Plan Note (Signed)
Controlled, no change in medication DASH diet and commitment to daily physical activity for a minimum of 30 minutes discussed and encouraged, as a part of hypertension management. The importance of attaining a healthy weight is also discussed.  

## 2015-01-23 NOTE — Assessment & Plan Note (Signed)
Acute flare with increased groin pain, anti inflammatories in office followed by short course of prednisone

## 2015-01-23 NOTE — Assessment & Plan Note (Signed)
Controlled, no change in medication Hyperlipidemia:Low fat diet discussed and encouraged.  Updated lab needed at/ before next visit.  

## 2015-02-01 ENCOUNTER — Ambulatory Visit (HOSPITAL_COMMUNITY)
Admission: RE | Admit: 2015-02-01 | Discharge: 2015-02-01 | Disposition: A | Discharge status: Home / Self Care | Payer: Medicaid Other | Source: Ambulatory Visit | Attending: Family Medicine | Admitting: Family Medicine

## 2015-02-01 ENCOUNTER — Encounter: Payer: Self-pay | Admitting: Family Medicine

## 2015-02-01 DIAGNOSIS — Z13820 Encounter for screening for osteoporosis: Secondary | ICD-10-CM

## 2015-02-01 DIAGNOSIS — Z780 Asymptomatic menopausal state: Secondary | ICD-10-CM | POA: Diagnosis not present

## 2015-02-01 DIAGNOSIS — M8580 Other specified disorders of bone density and structure, unspecified site: Secondary | ICD-10-CM | POA: Insufficient documentation

## 2015-02-02 ENCOUNTER — Other Ambulatory Visit: Payer: Self-pay

## 2015-02-02 MED ORDER — CALCIUM CARB-CHOLECALCIFEROL 500-400 MG-UNIT PO TABS: ORAL_TABLET | ORAL | Status: DC

## 2015-03-26 ENCOUNTER — Encounter (HOSPITAL_COMMUNITY): Payer: Self-pay | Admitting: Emergency Medicine

## 2015-03-26 ENCOUNTER — Emergency Department (HOSPITAL_COMMUNITY)
Admission: EM | Admit: 2015-03-26 | Discharge: 2015-03-26 | Disposition: A | Discharge status: Home / Self Care | Payer: Medicaid Other | Attending: Emergency Medicine | Admitting: Emergency Medicine

## 2015-03-26 ENCOUNTER — Emergency Department (HOSPITAL_COMMUNITY): Payer: Medicaid Other

## 2015-03-26 DIAGNOSIS — R0602 Shortness of breath: Secondary | ICD-10-CM | POA: Insufficient documentation

## 2015-03-26 DIAGNOSIS — Z79899 Other long term (current) drug therapy: Secondary | ICD-10-CM | POA: Diagnosis not present

## 2015-03-26 DIAGNOSIS — J45901 Unspecified asthma with (acute) exacerbation: Secondary | ICD-10-CM | POA: Diagnosis not present

## 2015-03-26 DIAGNOSIS — Z7951 Long term (current) use of inhaled steroids: Secondary | ICD-10-CM | POA: Insufficient documentation

## 2015-03-26 DIAGNOSIS — H539 Unspecified visual disturbance: Secondary | ICD-10-CM | POA: Diagnosis not present

## 2015-03-26 DIAGNOSIS — Z7952 Long term (current) use of systemic steroids: Secondary | ICD-10-CM | POA: Insufficient documentation

## 2015-03-26 DIAGNOSIS — M1990 Unspecified osteoarthritis, unspecified site: Secondary | ICD-10-CM | POA: Diagnosis not present

## 2015-03-26 HISTORY — DX: Unspecified visual disturbance: H53.9

## 2015-03-26 LAB — CBC WITH DIFFERENTIAL/PLATELET
Basophils Absolute: 0 10*3/uL (ref 0.0–0.1)
Basophils Relative: 0 % (ref 0–1)
Eosinophils Absolute: 0 10*3/uL (ref 0.0–0.7)
Eosinophils Relative: 1 % (ref 0–5)
HCT: 37.9 % (ref 36.0–46.0)
Hemoglobin: 12.6 g/dL (ref 12.0–15.0)
Lymphocytes Relative: 20 % (ref 12–46)
Lymphs Abs: 1.6 10*3/uL (ref 0.7–4.0)
MCH: 32.7 pg (ref 26.0–34.0)
MCHC: 33.2 g/dL (ref 30.0–36.0)
MCV: 98.4 fL (ref 78.0–100.0)
Monocytes Absolute: 0.5 10*3/uL (ref 0.1–1.0)
Monocytes Relative: 7 % (ref 3–12)
Neutro Abs: 5.7 10*3/uL (ref 1.7–7.7)
Neutrophils Relative %: 72 % (ref 43–77)
Platelets: 237 10*3/uL (ref 150–400)
RBC: 3.85 MIL/uL — ABNORMAL LOW (ref 3.87–5.11)
RDW: 13.5 % (ref 11.5–15.5)
WBC: 7.9 10*3/uL (ref 4.0–10.5)

## 2015-03-26 LAB — BASIC METABOLIC PANEL
Anion gap: 8 (ref 5–15)
BUN: 21 mg/dL (ref 6–23)
CO2: 27 mmol/L (ref 19–32)
Calcium: 9.3 mg/dL (ref 8.4–10.5)
Chloride: 106 mmol/L (ref 96–112)
Creatinine, Ser: 0.82 mg/dL (ref 0.50–1.10)
GFR calc Af Amer: 86 mL/min — ABNORMAL LOW (ref >90)
GFR calc non Af Amer: 74 mL/min — ABNORMAL LOW (ref >90)
Glucose, Bld: 110 mg/dL — ABNORMAL HIGH (ref 70–99)
Potassium: 3.9 mmol/L (ref 3.5–5.1)
Sodium: 141 mmol/L (ref 135–145)

## 2015-03-26 LAB — TROPONIN I: Troponin I: <0.03 ng/mL (ref <0.031)

## 2015-03-26 MED ORDER — IPRATROPIUM-ALBUTEROL 0.5-2.5 (3) MG/3ML IN SOLN
3.0000 mL | Freq: Once | RESPIRATORY_TRACT | Status: AC
  Administered 2015-03-26: 3 mL via RESPIRATORY_TRACT
  Filled 2015-03-26: qty 3

## 2015-03-26 NOTE — Discharge Instructions (Signed)
Return for any new or worse symptoms. Continue usual albuterol inhaler. Today's workup without any significant findings.

## 2015-03-26 NOTE — ED Provider Notes (Signed)
CSN: [id number]     Arrival date & time 03/26/15  1421 History   First MD Initiated Contact with Patient 03/26/15 1512     Chief Complaint  Patient presents with  . Shortness of Breath     (Consider location/radiation/quality/duration/timing/severity/associated sxs/prior Treatment) Patient is a 64 y.o. female presenting with shortness of breath. The history is provided by the patient, a relative and the EMS personnel.  Shortness of Breath Associated symptoms: no abdominal pain, no chest pain, no fever, no headaches, no rash and no vomiting    brought in by EMS for shortness of breath 1 day. Patient reports that the bathroom was cleaned with the chest are day she inhaled some of the fumes accidentally. Felt that it flared up her asthma improved overnight but then today started to feel short of breath again. Patient does have albuterol breathing treatments available at home.  Past Medical History  Diagnosis Date  . Asthma   . Arthritis   . Vision abnormalities     states close to blindness due to cataracts   Past Surgical History  Procedure Laterality Date  . Total abdominal hysterectomy    . Cataracts    . Caesarean     Family History  Problem Relation Age of Onset  . Arthritis    . Asthma     History  Substance Use Topics  . Smoking status: Former Research scientist (life sciences)  . Smokeless tobacco: Not on file  . Alcohol Use: No   OB History    No data available     Review of Systems  Constitutional: Negative for fever.  HENT: Negative for congestion.   Eyes: Positive for visual disturbance.  Respiratory: Positive for shortness of breath.   Cardiovascular: Negative for chest pain.  Gastrointestinal: Negative for nausea, vomiting and abdominal pain.  Genitourinary: Negative for dysuria.  Musculoskeletal: Negative for back pain.  Skin: Negative for rash.  Neurological: Negative for headaches.  Hematological: Does not bruise/bleed easily.  Psychiatric/Behavioral: Negative for confusion.       Allergies  Lotensin hct  Home Medications   Prior to Admission medications   Medication Sig Start Date End Date Taking? Authorizing Provider  amLODipine (NORVASC) 10 MG tablet Take 1 tablet (10 mg total) by mouth at bedtime. 01/16/15  Yes Fayrene Helper, MD  Calcium Carb-Cholecalciferol (CALCIUM 500 +D) 500-400 MG-UNIT TABS Take tab twice daily Patient taking differently: Take 1 tablet by mouth 2 (two) times daily. Take tab twice daily 02/02/15  Yes Fayrene Helper, MD  cetirizine (ZYRTEC) 10 MG tablet TAKE 1 TABLET BY MOUTH EVERY DAY. 11/08/14  Yes Fayrene Helper, MD  fluticasone (FLONASE) 50 MCG/ACT nasal spray USE 1 SPRAY IN EACH NOSTRIL DAILY. 01/06/15  Yes Fayrene Helper, MD  ibuprofen (ADVIL,MOTRIN) 200 MG tablet Take 200 mg by mouth every 6 (six) hours as needed for mild pain or moderate pain.   Yes Historical Provider, MD  lovastatin (MEVACOR) 40 MG tablet TAKE (1) TABLET BY MOUTH AT BEDTIME. 01/16/15  Yes Fayrene Helper, MD  nabumetone (RELAFEN) 750 MG tablet Take 1 tablet (750 mg total) by mouth daily. 01/16/15  Yes Fayrene Helper, MD  predniSONE (DELTASONE) 5 MG tablet Take 1 tablet (5 mg total) by mouth 2 (two) times daily with a meal. 01/16/15  Yes Fayrene Helper, MD  PROAIR HFA 108 (90 BASE) MCG/ACT inhaler INHALE 2 PUFFS INTO THE LUNGS EVERY 4 TO 6 HOURS AS NEEDED FOR WHEEZING OR SHORT OF BREATH. 10/10/14  Yes Fayrene Helper, MD  QVAR 40 MCG/ACT inhaler INHALE 2 PUFFS INTO THE LUNGS TWICE DAILY. RINSE MOUTH AFTER. 11/23/14  Yes Fayrene Helper, MD  SALINE NASAL MIST NA Place 1-2 sprays into the nose daily as needed (for congestion).   Yes Historical Provider, MD  tiZANidine (ZANAFLEX) 4 MG tablet One tablet at bedtime ,a s needed, for uncontrolled back pain and spasm Patient taking differently: Take 4 mg by mouth at bedtime as needed for muscle spasms. One tablet at bedtime ,a s needed, for uncontrolled back pain and spasm 08/25/14 08/25/15 Yes  Fayrene Helper, MD  triamterene-hydrochlorothiazide (MAXZIDE-25) 37.5-25 MG per tablet Take 1 tablet by mouth daily. 01/16/15  Yes Fayrene Helper, MD   BP 155/95 mmHg  Pulse 85  Temp(Src) 97.9 F (36.6 C) (Oral)  Resp 17  Ht 5\' 7"  (1.702 m)  Wt 204 lb (92.534 kg)  BMI 31.94 kg/m2  SpO2 98% Physical Exam  Constitutional: She is oriented to person, place, and time. She appears well-developed and well-nourished. No distress.  HENT:  Head: Normocephalic and atraumatic.  Mouth/Throat: Oropharynx is clear and moist.  Eyes: Conjunctivae and EOM are normal. Pupils are equal, round, and reactive to light.  Neck: Normal range of motion. Neck supple.  Cardiovascular: Normal rate, regular rhythm and normal heart sounds.   Pulmonary/Chest: Effort normal and breath sounds normal. No respiratory distress. She has no wheezes.  Abdominal: Bowel sounds are normal. There is no tenderness.  Musculoskeletal: Normal range of motion.  Neurological: She is alert and oriented to person, place, and time. No cranial nerve deficit. She exhibits normal muscle tone. Coordination normal.  Skin: Skin is warm. No rash noted.  Nursing note and vitals reviewed.   ED Course  Procedures (including critical care time) Labs Review Labs Reviewed  CBC WITH DIFFERENTIAL/PLATELET - Abnormal; Notable for the following:    RBC 3.85 (*)    All other components within normal limits  BASIC METABOLIC PANEL - Abnormal; Notable for the following:    Glucose, Bld 110 (*)    GFR calc non Af Amer 74 (*)    GFR calc Af Amer 86 (*)    All other components within normal limits  TROPONIN I    Imaging Review Dg Chest 2 View  03/26/2015   CLINICAL DATA:  Shortness of breath.  EXAM: CHEST  2 VIEW  COMPARISON:  03/31/2011  FINDINGS: The cardiac silhouette, mediastinal and hilar contours are within normal limits and stable. The lungs are clear. No pleural effusion. The bony thorax is intact.  IMPRESSION: No acute  cardiopulmonary findings.   Electronically Signed   By: Marijo Sanes M.D.   On: 03/26/2015 15:24     EKG Interpretation   Date/Time:  Sunday March 26 2015 14:36:58 EDT Ventricular Rate:  77 PR Interval:  185 QRS Duration: 90 QT Interval:  537 QTC Calculation: 608 R Axis:   19 Text Interpretation:  Sinus rhythm Repol abnrm suggests ischemia, inferior  leads Minimal ST elevation, anterior leads Prolonged QT interval Baseline  wander in lead(s) II III aVF V3 bad artifact Confirmed by Aliyanah Rozas  MD,  Vonya Ohalloran ([id number]) on 03/26/2015 3:31:18 PM      MDM   Final diagnoses:  SOB (shortness of breath)    Workup for the shortness of breath without any significant findings here. No wheezing patient did feel better after albuterol Atrovent nebulizer. Patient never had any hypoxia has been off oxygen on room air and satting of 95-96%.  No respiratory distress. EKG without any acute findings. Labs without any significant abnormalities. Patient will continue her albuterol inhaler. There is no more lingering bleach smell in the house that bleach episode was yesterday.    Fredia Sorrow, MD 03/26/15 [phone]

## 2015-03-26 NOTE — ED Notes (Signed)
Patient arrives via EMS from home with c/o shortness of breath x 1 day. Patient reports family cleaning bathroom with bleach yesterday, and she inhaled the fumes. Patient states flare up of asthma that improved overnight, but that today just PTA, symptoms returned. States she feels like she can't get a full breath. Patient alert/oriented.

## 2015-05-01 ENCOUNTER — Other Ambulatory Visit: Payer: Self-pay | Admitting: Family Medicine

## 2015-05-02 DIAGNOSIS — R069 Unspecified abnormalities of breathing: Secondary | ICD-10-CM | POA: Diagnosis not present

## 2015-05-03 ENCOUNTER — Encounter: Payer: Self-pay | Admitting: Family Medicine

## 2015-05-03 ENCOUNTER — Ambulatory Visit (INDEPENDENT_AMBULATORY_CARE_PROVIDER_SITE_OTHER): Payer: Medicare Other | Admitting: Family Medicine

## 2015-05-03 VITALS — BP 136/82 | HR 98 | Resp 16 | Ht 67.0 in | Wt 207.1 lb

## 2015-05-03 DIAGNOSIS — Z91048 Other nonmedicinal substance allergy status: Secondary | ICD-10-CM

## 2015-05-03 DIAGNOSIS — J45991 Cough variant asthma: Secondary | ICD-10-CM | POA: Diagnosis not present

## 2015-05-03 DIAGNOSIS — E669 Obesity, unspecified: Secondary | ICD-10-CM | POA: Diagnosis not present

## 2015-05-03 DIAGNOSIS — E559 Vitamin D deficiency, unspecified: Secondary | ICD-10-CM | POA: Diagnosis not present

## 2015-05-03 DIAGNOSIS — B351 Tinea unguium: Secondary | ICD-10-CM

## 2015-05-03 DIAGNOSIS — H547 Unspecified visual loss: Secondary | ICD-10-CM

## 2015-05-03 DIAGNOSIS — R7309 Other abnormal glucose: Secondary | ICD-10-CM | POA: Diagnosis not present

## 2015-05-03 DIAGNOSIS — I10 Essential (primary) hypertension: Secondary | ICD-10-CM

## 2015-05-03 DIAGNOSIS — Z9109 Other allergy status, other than to drugs and biological substances: Secondary | ICD-10-CM

## 2015-05-03 DIAGNOSIS — E785 Hyperlipidemia, unspecified: Secondary | ICD-10-CM | POA: Diagnosis not present

## 2015-05-03 DIAGNOSIS — R7303 Prediabetes: Secondary | ICD-10-CM

## 2015-05-03 DIAGNOSIS — Z23 Encounter for immunization: Secondary | ICD-10-CM

## 2015-05-03 DIAGNOSIS — H9201 Otalgia, right ear: Secondary | ICD-10-CM

## 2015-05-03 DIAGNOSIS — F411 Generalized anxiety disorder: Secondary | ICD-10-CM

## 2015-05-03 DIAGNOSIS — M8580 Other specified disorders of bone density and structure, unspecified site: Secondary | ICD-10-CM

## 2015-05-03 LAB — HEPATIC FUNCTION PANEL
ALT: 29 U/L (ref 0–35)
AST: 24 U/L (ref 0–37)
Albumin: 4.3 g/dL (ref 3.5–5.2)
Alkaline Phosphatase: 54 U/L (ref 39–117)
Bilirubin, Direct: 0.1 mg/dL (ref 0.0–0.3)
Indirect Bilirubin: 0.3 mg/dL (ref 0.2–1.2)
Total Bilirubin: 0.4 mg/dL (ref 0.2–1.2)
Total Protein: 8.3 g/dL (ref 6.0–8.3)

## 2015-05-03 LAB — LIPID PANEL
Cholesterol: 197 mg/dL (ref 0–200)
HDL: 91 mg/dL (ref >=46)
LDL Cholesterol: 92 mg/dL (ref 0–99)
Total CHOL/HDL Ratio: 2.2 Ratio
Triglycerides: 71 mg/dL (ref <150)
VLDL: 14 mg/dL (ref 0–40)

## 2015-05-03 LAB — TSH: TSH: 3.918 u[IU]/mL (ref 0.350–4.500)

## 2015-05-03 LAB — HEMOGLOBIN A1C
Hgb A1c MFr Bld: 5.8 % — ABNORMAL HIGH (ref <5.7)
Mean Plasma Glucose: 120 mg/dL — ABNORMAL HIGH (ref <117)

## 2015-05-03 MED ORDER — PAROXETINE HCL 10 MG PO TABS: 10.0000 mg | ORAL_TABLET | Freq: Every day | ORAL | Status: DC

## 2015-05-03 MED ORDER — MONTELUKAST SODIUM 10 MG PO TABS: 10.0000 mg | ORAL_TABLET | Freq: Every day | ORAL | Status: DC

## 2015-05-03 NOTE — Patient Instructions (Signed)
Keep June appt, call if you need me sooner  Prevnar today New for breathing is singulair one daily  You are referred to allergist for allergies and breathing  You are referred to ENT for right ear pain and nasal pain  New medication for anxiety is paxil 10 mg one daily  You will start medication for fungal toenail infection AFTER we see  Blood test result  Blood pressure is good  Please work on good  health habits so that your health will improve. 1. Commitment to daily physical activity for 30 to 60  minutes, if you are able to do this.  2. Commitment to wise food choices. Aim for half of your  food intake to be vegetable and fruit, one quarter starchy foods, and one quarter protein. Try to eat on a regular schedule  3 meals per day, snacking between meals should be limited to vegetables or fruits or small portions of nuts. 64 ounces of water per day is generally recommended, unless you have specific health conditions, like heart failure or kidney failure where you will need to limit fluid intake.  3. Commitment to sufficient and a  good quality of physical and mental rest daily, generally between 6 to 8 hours per day.  WITH PERSISTANCE AND PERSEVERANCE, THE IMPOSSIBLE , BECOMES THE NORM!   Thanks for choosing [facility], we consider it a privelige to serve you.

## 2015-05-03 NOTE — Assessment & Plan Note (Signed)
Chronic right ear pain, hearing loss and nasal pain, ENT to eval

## 2015-05-03 NOTE — Assessment & Plan Note (Addendum)
Reports increased and uncontrolled allergies, allergic to "everything", refer to allergist , reports inc wheeze requests nebulizer machine, will hold on that pending eval by allergist

## 2015-05-03 NOTE — Assessment & Plan Note (Addendum)
Uncontrolled , refer to allergist for further eval and management, singulair started also

## 2015-05-03 NOTE — Assessment & Plan Note (Signed)
Updated lab needed today Hyperlipidemia:Low fat diet discussed and encouraged.   Lipid Panel  Lab Results  Component Value Date   CHOL 163 08/25/2014   HDL 73 08/25/2014   LDLCALC 72 08/25/2014   TRIG 91 08/25/2014   CHOLHDL 2.2 08/25/2014

## 2015-05-03 NOTE — Assessment & Plan Note (Signed)
reportedly worsening , is followed by opthalmology

## 2015-05-03 NOTE — Assessment & Plan Note (Signed)
Deteriorated. Patient re-educated about  tThe importance of healthy food choices with portion control discussed. Encouraged to start a food diary, count calories and to consider  joining a support group. Sample diet sheets offered. Goals set by the patient for the next several months.   Weight /BMI 05/03/2015 03/26/2015 01/16/2015  WEIGHT 207 lb 1.9 oz 204 lb 204 lb  HEIGHT 5\' 7"  5\' 7"  5\' 7"   BMI 32.43 kg/m2 31.94 kg/m2 31.94 kg/m2    Current exercise per week 40 minutes.

## 2015-05-03 NOTE — Assessment & Plan Note (Signed)
Will commit to 12 weeks of terbinafine tabs once hepatic function is normal

## 2015-05-03 NOTE — Progress Notes (Signed)
Subjective:    Patient ID: [name], female    DOB: [birth year], 64 y.o.   MRN: [id number]  HPI   [name]     MRN: [id number]      DOB: [birth year]   HPI [name] is here for follow up and re-evaluation of chronic medical conditions, medication management and review of any available recent lab and radiology data. Her sister  Accompanies her for the firs time due to concerns discussed , esp as she notes that [name]'s sight and ability to function independently appears to be deteriorating Preventive health is updated, specifically  Cancer screening and Immunization.   C/o chronic ear pain and nasal pain C/o excessive allergies, wants to be tested, also c/o increased wheezing and cough requests nebulizer  ROS Denies recent fever or chills. Denies sinus pressure, nasal congestion, ear pain or sore throat. Denies chest congestion, productive cough , c/o increased dry cough and  wheezing. Denies chest pains, palpitations and leg swelling Denies abdominal pain, nausea, vomiting,diarrhea or constipation.   Denies dysuria, frequency, hesitancy or incontinence.  Denies headaches,  Denies skin break down or rash.   PE  BP 136/82 mmHg  Pulse 98  Resp 16  Ht 5\' 7"  (1.702 m)  Wt 207 lb 1.9 oz (93.949 kg)  BMI 32.43 kg/m2  SpO2 97%  Patient alert and oriented and in no cardiopulmonary distress.  HEENT: No facial asymmetry, EOMI,   oropharynx pink and moist.  Neck no JVD, no mass.TM clear bilaterally, nasal mucosa thickened  Chest: Clear to auscultation bilaterally.No crackles or wheezes  CVS: S1, S2 no murmurs, no S3.Regular rate.  ABD: Soft non tender.   Ext: No edema  MS: Decreased ROM spine, hips , knees  Skin: Intact, no ulcerations or rash noted.  Psych: normal affect. Memory intact , anxious not  depressed appearing.  CNS: CN 2-12 intact, power,  normal throughout.no focal deficits noted.   Assessment & Plan   Asthma, cough  variant Uncontrolled , refer to allergist for further eval and management, singulair started also  Essential hypertension Controlled, no change in medication   Prediabetes Updated lab needed at/ before next visit. Patient educated about the importance of limiting  Carbohydrate intake , the need to commit to daily physical activity for a minimum of 30 minutes , and to commit weight loss. The fact that changes in all these areas will reduce or eliminate all together the development of diabetes is stressed.   Diabetic Labs Latest Ref Rng 03/26/2015 01/16/2015 08/25/2014 02/14/2014 05/17/2013  HbA1c <5.7 % - 5.8(H) 6.0(H) 6.0(H) 5.6  Chol 0 - 200 mg/dL - - 163 166 187  HDL >39 mg/dL - - 73 50 67  Calc LDL 0 - 99 mg/dL - - 72 96 105(H)  Triglycerides <150 mg/dL - - 91 98 75  Creatinine 0.50 - 1.10 mg/dL 0.82 0.77 0.77 0.85 0.82   BP/Weight 05/03/2015 03/26/2015 01/16/2015 08/25/2014 02/14/2014 10/18/2013 7/62/2633  Systolic BP 354 562 563 893 734 287 -  Diastolic BP 82 79 82 82 82 84 -  Wt. (Lbs) 207.12 204 204 205 208 213 212  BMI 32.43 31.94 31.94 32.1 32.57 33.35 33.2   No flowsheet data found.     Hyperlipidemia LDL goal <100 Updated lab needed today Hyperlipidemia:Low fat diet discussed and encouraged.   Lipid Panel  Lab Results  Component Value Date   CHOL 163 08/25/2014   HDL 73 08/25/2014   LDLCALC 72 08/25/2014   TRIG 91  08/25/2014   CHOLHDL 2.2 08/25/2014        Loss of vision reportedly worsening , is followed by opthalmology  Multiple environmental allergies Reports increased and uncontrolled allergies, allergic to "everything", refer to allergist , reports inc wheeze requests nebulizer machine, will hold on that pending eval by allergist  Otalgia of right ear Chronic right ear pain, hearing loss and nasal pain, ENT to eval  Onychomycosis Will commit to 12 weeks of terbinafine tabs once hepatic function is normal  Obesity (BMI 30-39.9) Deteriorated. Patient  re-educated about  tThe importance of healthy food choices with portion control discussed. Encouraged to start a food diary, count calories and to consider  joining a support group. Sample diet sheets offered. Goals set by the patient for the next several months.   Weight /BMI 05/03/2015 03/26/2015 01/16/2015  WEIGHT 207 lb 1.9 oz 204 lb 204 lb  HEIGHT 5\' 7"  5\' 7"  5\' 7"   BMI 32.43 kg/m2 31.94 kg/m2 31.94 kg/m2    Current exercise per week 40 minutes.   GAD (generalized anxiety disorder) paxil started , which will also help with mild underlying depression as health [name] is deteriorating        Review of Systems     Objective:   Physical Exam        Assessment & Plan:

## 2015-05-03 NOTE — Assessment & Plan Note (Signed)
Controlled, no change in medication  

## 2015-05-03 NOTE — Assessment & Plan Note (Signed)
Updated lab needed at/ before next visit. Patient educated about the importance of limiting  Carbohydrate intake , the need to commit to daily physical activity for a minimum of 30 minutes , and to commit weight loss. The fact that changes in all these areas will reduce or eliminate all together the development of diabetes is stressed.   Diabetic Labs Latest Ref Rng 03/26/2015 01/16/2015 08/25/2014 02/14/2014 05/17/2013  HbA1c <5.7 % - 5.8(H) 6.0(H) 6.0(H) 5.6  Chol 0 - 200 mg/dL - - 163 166 187  HDL >39 mg/dL - - 73 50 67  Calc LDL 0 - 99 mg/dL - - 72 96 105(H)  Triglycerides <150 mg/dL - - 91 98 75  Creatinine 0.50 - 1.10 mg/dL 0.82 0.77 0.77 0.85 0.82   BP/Weight 05/03/2015 03/26/2015 01/16/2015 08/25/2014 02/14/2014 10/18/2013 0/21/1155  Systolic BP 208 022 336 122 449 753 -  Diastolic BP 82 79 82 82 82 84 -  Wt. (Lbs) 207.12 204 204 205 208 213 212  BMI 32.43 31.94 31.94 32.1 32.57 33.35 33.2   No flowsheet data found.

## 2015-05-04 LAB — VITAMIN D 25 HYDROXY (VIT D DEFICIENCY, FRACTURES): Vit D, 25-Hydroxy: 31 ng/mL (ref 30–100)

## 2015-05-11 ENCOUNTER — Ambulatory Visit (INDEPENDENT_AMBULATORY_CARE_PROVIDER_SITE_OTHER): Payer: Medicare Other | Admitting: Otolaryngology

## 2015-05-11 DIAGNOSIS — H9201 Otalgia, right ear: Secondary | ICD-10-CM

## 2015-05-11 DIAGNOSIS — H903 Sensorineural hearing loss, bilateral: Secondary | ICD-10-CM

## 2015-05-11 DIAGNOSIS — H04123 Dry eye syndrome of bilateral lacrimal glands: Secondary | ICD-10-CM | POA: Diagnosis not present

## 2015-05-15 ENCOUNTER — Telehealth: Payer: Self-pay | Admitting: Family Medicine

## 2015-05-17 ENCOUNTER — Other Ambulatory Visit: Payer: Self-pay | Admitting: Family Medicine

## 2015-05-17 ENCOUNTER — Other Ambulatory Visit: Payer: Self-pay

## 2015-05-17 MED ORDER — TERBINAFINE HCL 250 MG PO TABS: 250.0000 mg | ORAL_TABLET | Freq: Every day | ORAL | Status: DC

## 2015-05-17 NOTE — Telephone Encounter (Signed)
Noted that patient with normal hepatic.  Med sent to pharmacy.

## 2015-05-18 DIAGNOSIS — Z23 Encounter for immunization: Secondary | ICD-10-CM | POA: Insufficient documentation

## 2015-05-18 DIAGNOSIS — F411 Generalized anxiety disorder: Secondary | ICD-10-CM | POA: Insufficient documentation

## 2015-05-18 NOTE — Assessment & Plan Note (Signed)
paxil started , which will also help with mild underlying depression as health [name] is deteriorating

## 2015-05-30 ENCOUNTER — Other Ambulatory Visit: Payer: Self-pay | Admitting: Family Medicine

## 2015-06-05 ENCOUNTER — Other Ambulatory Visit: Payer: Self-pay | Admitting: Family Medicine

## 2015-06-05 DIAGNOSIS — Z1231 Encounter for screening mammogram for malignant neoplasm of breast: Secondary | ICD-10-CM

## 2015-06-06 DIAGNOSIS — R0602 Shortness of breath: Secondary | ICD-10-CM | POA: Diagnosis not present

## 2015-06-06 DIAGNOSIS — J309 Allergic rhinitis, unspecified: Secondary | ICD-10-CM | POA: Diagnosis not present

## 2015-06-06 DIAGNOSIS — R05 Cough: Secondary | ICD-10-CM | POA: Diagnosis not present

## 2015-06-08 ENCOUNTER — Encounter: Payer: Medicare Other | Admitting: Family Medicine

## 2015-06-19 ENCOUNTER — Ambulatory Visit (HOSPITAL_COMMUNITY)
Admission: RE | Admit: 2015-06-19 | Discharge: 2015-06-19 | Disposition: A | Discharge status: Home / Self Care | Payer: Medicare Other | Source: Ambulatory Visit | Attending: Family Medicine | Admitting: Family Medicine

## 2015-06-19 DIAGNOSIS — Z1231 Encounter for screening mammogram for malignant neoplasm of breast: Secondary | ICD-10-CM

## 2015-07-11 ENCOUNTER — Other Ambulatory Visit: Payer: Self-pay | Admitting: Family Medicine

## 2015-08-07 ENCOUNTER — Other Ambulatory Visit: Payer: Self-pay | Admitting: Family Medicine

## 2015-08-29 ENCOUNTER — Other Ambulatory Visit: Payer: Self-pay | Admitting: Family Medicine

## 2015-09-01 ENCOUNTER — Other Ambulatory Visit: Payer: Self-pay | Admitting: Family Medicine

## 2015-10-04 ENCOUNTER — Other Ambulatory Visit: Payer: Self-pay | Admitting: Family Medicine

## 2015-10-24 ENCOUNTER — Other Ambulatory Visit: Payer: Self-pay | Admitting: Neurology

## 2015-10-24 ENCOUNTER — Ambulatory Visit (INDEPENDENT_AMBULATORY_CARE_PROVIDER_SITE_OTHER): Payer: Medicare Other | Admitting: Allergy and Immunology

## 2015-10-24 ENCOUNTER — Encounter: Payer: Self-pay | Admitting: Allergy and Immunology

## 2015-10-24 VITALS — BP 140/82 | HR 84 | Temp 97.8°F | Resp 18

## 2015-10-24 DIAGNOSIS — R05 Cough: Secondary | ICD-10-CM

## 2015-10-24 DIAGNOSIS — H1010 Acute atopic conjunctivitis, unspecified eye: Secondary | ICD-10-CM | POA: Diagnosis not present

## 2015-10-24 DIAGNOSIS — J309 Allergic rhinitis, unspecified: Secondary | ICD-10-CM | POA: Diagnosis not present

## 2015-10-24 DIAGNOSIS — R059 Cough, unspecified: Secondary | ICD-10-CM

## 2015-10-24 MED ORDER — ALBUTEROL SULFATE HFA 108 (90 BASE) MCG/ACT IN AERS: INHALATION_SPRAY | RESPIRATORY_TRACT | Status: DC

## 2015-10-24 MED ORDER — FLUTICASONE PROPIONATE 50 MCG/ACT NA SUSP: NASAL | Status: DC

## 2015-10-24 MED ORDER — BECLOMETHASONE DIPROPIONATE 40 MCG/ACT IN AERS: 2.0000 | INHALATION_SPRAY | Freq: Two times a day (BID) | RESPIRATORY_TRACT | Status: DC

## 2015-10-24 MED ORDER — MONTELUKAST SODIUM 10 MG PO TABS: 10.0000 mg | ORAL_TABLET | Freq: Every day | ORAL | Status: DC

## 2015-10-24 NOTE — Progress Notes (Signed)
FOLLOW UP NOTE  RE: [name] MRN: [id number] DOB: Jan 15, 1951 [facility] [place] [address] [place],  [address] Date of Office Visit: 10/24/2015  Subjective:  [name] is a 65 y.o. female who presents today for Allergy Testing  Assessment:  1.  Mixed rhinitis. 2.  History of cough improved on low dose inhaled corticosteroid. 3.  Complex medical history.  Plan:  1.  Reviewed with [name] and [name] environmental control measures for Dust Mite and animal dander avoidance as well as weed pollen season. 2.  Restart Zyrtec given its benefit and begin Flonase 1-2 sprays each nostril once daily. 3.  Continue other medications per primary MD. 4.  Add Saline nasal wash each evening and as needed. 5.  Follow-up in 6 months or sooner if needed.  HPI: [name] returns to the office off Zyrtec for re-evaluation of aeroallergen and selected food sensitivities.  [name] notes sneezing off Zyrtec without other symptoms or concerns.  [name] feels current medication regime is helpful but wonders about other sensitivities as 2008 testing did not reveal much and foods were negative.  [name] feels some foods--bacon or spices may change Grace breathing however [name] eats all foods without difficulty.  No history of reaction, hives, rashes, swelling, cough, wheeze, congestion or GI upset. [name] has no other concerns.    Current Medications: 1.  QVAR 2 puffs twice daily. 2.  Proair as needed. 3.  Singulair 10mg  daily. 4.  Continues Norvasc, Calcium, Mevacor, Paxil and Maxide.  Drug Allergies: Allergies  Allergen Reactions  . Lotensin Hct [Benazepril-Hydrochlorothiazide] Hives    Objective:   Filed Vitals:   10/24/15 1408  BP: 140/82  Pulse: 84  Temp: 97.8 F (36.6 C)  Resp: 18   Physical Exam  Constitutional: [name] is well-developed, well-nourished, and in no distress.  HENT:  Head: Atraumatic.  Right Ear: Tympanic membrane and  ear canal normal.  Left Ear: Tympanic membrane and ear canal normal.  Nose: Mucosal edema present. No rhinorrhea. No epistaxis.  Mouth/Throat: Oropharynx is clear and moist and mucous membranes are normal. No oropharyngeal exudate, posterior oropharyngeal edema or posterior oropharyngeal erythema.  Neck: Neck supple.  Cardiovascular: Normal rate, S1 normal and S2 normal.   No murmur heard. Pulmonary/Chest: Effort normal. [name] has no wheezes. [name] has no rhonchi. [name] has no rales.  Lymphadenopathy:    [name] has no cervical adenopathy.   Diagnostics: Spirometry: FVC 2.22--83%, FEV1 2.00--95%.  Skin testing:  Mild reactivity to weed mix, cat hair, dog epithelia, and dust mite mix via intradermal; negative to selected foods.    Grace M. Ishmael Holter, MD  cc:  Tula Nakayama, MD

## 2015-10-24 NOTE — Patient Instructions (Signed)
Take Home Sheet  1. Avoidance: Mite and Pollen   2. Antihistamine:   Zyrtec 10mg  by mouth once daily for runny nose or itching.  3. Nasal Spray: Flonase 1-2 spray(s) each nostril once daily for stuffy nose or drainage.    4. Inhalers:  Rescue: ProAir 2 puffs every 4 hours as needed for cough or wheeze.       -May use 2 puffs 10-20 minutes prior to exercise.   Preventative: QVAR 2 puffs twice daily (Rinse, gargle, and spit out after use).    5. Other: Continue Singulair each evening.   6. Nasal Saline wash followed by nasal spray once daily as directed.   8. Follow up Visit:   4-6 months or sooner if needed.  Websites that have reliable Patient information: 1. American Academy of Asthma, Allergy, & Immunology: www.aaaai.org 2. Food Allergy Network: www.foodallergy.org 3. Mothers of Asthmatics: www.aanma.org 4. [place]: DiningCalendar.de 5. American College of Allergy, Asthma, & Immunology: https://robertson.info/ or www.acaai.org

## 2015-11-01 ENCOUNTER — Other Ambulatory Visit: Payer: Self-pay | Admitting: Family Medicine

## 2015-11-03 ENCOUNTER — Other Ambulatory Visit: Payer: Self-pay | Admitting: Family Medicine

## 2015-12-13 ENCOUNTER — Other Ambulatory Visit: Payer: Self-pay

## 2015-12-13 ENCOUNTER — Encounter: Payer: Self-pay | Admitting: Family Medicine

## 2015-12-13 ENCOUNTER — Ambulatory Visit (INDEPENDENT_AMBULATORY_CARE_PROVIDER_SITE_OTHER): Payer: Medicare Other | Admitting: Family Medicine

## 2015-12-13 VITALS — BP 130/84 | HR 82 | Resp 16 | Ht 66.0 in | Wt 206.0 lb

## 2015-12-13 DIAGNOSIS — R7303 Prediabetes: Secondary | ICD-10-CM | POA: Diagnosis not present

## 2015-12-13 DIAGNOSIS — I10 Essential (primary) hypertension: Secondary | ICD-10-CM | POA: Diagnosis not present

## 2015-12-13 DIAGNOSIS — Z1159 Encounter for screening for other viral diseases: Secondary | ICD-10-CM

## 2015-12-13 DIAGNOSIS — K219 Gastro-esophageal reflux disease without esophagitis: Secondary | ICD-10-CM

## 2015-12-13 DIAGNOSIS — Z0000 Encounter for general adult medical examination without abnormal findings: Secondary | ICD-10-CM | POA: Diagnosis not present

## 2015-12-13 DIAGNOSIS — Z114 Encounter for screening for human immunodeficiency virus [HIV]: Secondary | ICD-10-CM | POA: Diagnosis not present

## 2015-12-13 DIAGNOSIS — F329 Major depressive disorder, single episode, unspecified: Secondary | ICD-10-CM

## 2015-12-13 DIAGNOSIS — Z23 Encounter for immunization: Secondary | ICD-10-CM

## 2015-12-13 DIAGNOSIS — F32A Depression, unspecified: Secondary | ICD-10-CM

## 2015-12-13 DIAGNOSIS — M25551 Pain in right hip: Secondary | ICD-10-CM

## 2015-12-13 DIAGNOSIS — E785 Hyperlipidemia, unspecified: Secondary | ICD-10-CM | POA: Diagnosis not present

## 2015-12-13 DIAGNOSIS — R7309 Other abnormal glucose: Secondary | ICD-10-CM | POA: Diagnosis not present

## 2015-12-13 DIAGNOSIS — F324 Major depressive disorder, single episode, in partial remission: Secondary | ICD-10-CM | POA: Insufficient documentation

## 2015-12-13 LAB — COMPREHENSIVE METABOLIC PANEL
ALT: 23 U/L (ref 6–29)
AST: 20 U/L (ref 10–35)
Albumin: 4.2 g/dL (ref 3.6–5.1)
Alkaline Phosphatase: 57 U/L (ref 33–130)
BUN: 18 mg/dL (ref 7–25)
CO2: 25 mmol/L (ref 20–31)
Calcium: 9.8 mg/dL (ref 8.6–10.4)
Chloride: 104 mmol/L (ref 98–110)
Creat: 0.76 mg/dL (ref 0.50–0.99)
Glucose, Bld: 93 mg/dL (ref 65–99)
Potassium: 3.7 mmol/L (ref 3.5–5.3)
Sodium: 141 mmol/L (ref 135–146)
Total Bilirubin: 0.4 mg/dL (ref 0.2–1.2)
Total Protein: 7.6 g/dL (ref 6.1–8.1)

## 2015-12-13 LAB — LIPID PANEL
Cholesterol: 205 mg/dL — ABNORMAL HIGH (ref 125–200)
HDL: 84 mg/dL (ref >=46)
LDL Cholesterol: 96 mg/dL (ref <130)
Total CHOL/HDL Ratio: 2.4 Ratio (ref <=5.0)
Triglycerides: 123 mg/dL (ref <150)
VLDL: 25 mg/dL (ref <30)

## 2015-12-13 LAB — POCT URINALYSIS DIPSTICK
Blood, UA: NEGATIVE
Glucose, UA: NEGATIVE
Ketones, UA: NEGATIVE
Nitrite, UA: NEGATIVE
Protein, UA: NEGATIVE
Spec Grav, UA: 1.025
Urobilinogen, UA: 0.2
pH, UA: 6

## 2015-12-13 MED ORDER — IBUPROFEN 600 MG PO TABS: ORAL_TABLET | ORAL | Status: DC

## 2015-12-13 MED ORDER — PANTOPRAZOLE SODIUM 20 MG PO TBEC: 20.0000 mg | DELAYED_RELEASE_TABLET | Freq: Every day | ORAL | Status: DC

## 2015-12-13 MED ORDER — METHYLPREDNISOLONE ACETATE 80 MG/ML IJ SUSP
80.0000 mg | Freq: Once | INTRAMUSCULAR | Status: AC
  Administered 2015-12-13: 80 mg via INTRAMUSCULAR

## 2015-12-13 MED ORDER — KETOROLAC TROMETHAMINE 60 MG/2ML IM SOLN
60.0000 mg | Freq: Once | INTRAMUSCULAR | Status: AC
  Administered 2015-12-13: 60 mg via INTRAMUSCULAR

## 2015-12-13 MED ORDER — PREDNISONE 5 MG PO TABS: 5.0000 mg | ORAL_TABLET | Freq: Two times a day (BID) | ORAL | Status: AC

## 2015-12-13 MED ORDER — PAROXETINE HCL 20 MG PO TABS: 20.0000 mg | ORAL_TABLET | Freq: Every day | ORAL | Status: DC

## 2015-12-13 NOTE — Assessment & Plan Note (Signed)
C/o heartburn and symptoms of regurgitation and reflux, will start protonix and educate

## 2015-12-13 NOTE — Assessment & Plan Note (Signed)

## 2015-12-13 NOTE — Assessment & Plan Note (Signed)
Uncontrolled.Toradol and depo medrol administered IM in the office , to be followed by a short course of oral prednisone and NSAIDS.  

## 2015-12-13 NOTE — Progress Notes (Signed)
Subjective:    Patient ID: [name], female    DOB: March 10, 1951, 65 y.o.   MRN: [id number]  HPI Preventive Screening-Counseling & Management   Patient present here today for a Welcome to medicare visit   Current Problems (verified)   Medications Prior to Visit Allergies (verified)   PAST HISTORY  Family History (verified)   Social History  Single, 1 daughter and 3 grandchildren, disabled. Remote history of smoking in 1988, No drug or alcool use    Risk Factors  Current exercise habits:  Walks daily but limited due to visual loss  Dietary issues discussed: heart healthy diet, limits fried fatty foods and eats more baked grilled foods    Cardiac risk factors:   Depression Screen  (Note: if answer to either of the following is "Yes", a more complete depression screening is indicated)   Over the past two weeks, have you felt down, depressed or hopeless? sometimes  Over the past two weeks, have you felt little interest or pleasure in doing things? No  Have you lost interest or pleasure in daily life? No  Do you often feel hopeless? No  Do you cry easily over simple problems? Can't cry but feels down sometimes. Takes meds for depression and states it seems to help   Activities of Daily Living  In your present state of health, do you have any difficulty performing the following activities?  Driving?: doesn't drive due to poor vision and hearing loss  Managing money?: daughter pays bills  Feeding yourself?:No Getting from bed to chair?:No Climbing a flight of stairs?:doesn't climb stairs  Preparing food and eating?:No Bathing or showering?: no  Getting dressed?:No Getting to the toilet?:No Using the toilet?:No Moving around from place to place?: yes, uses cane to get around her home   Fall Risk Assessment In the past year have you fallen or had a near fall?:No Are you currently taking any medications that make you dizzy?:No   Hearing Difficulties: No Do you  often ask people to speak up or repeat themselves?: yes, bilateral hearing loss  Do you experience ringing or noises in your ears?:sometimes  Do you have difficulty understanding soft or whispered voices?:sometimes  Cognitive Testing  Alert? Yes Normal Appearance?Yes  Oriented to person? Yes Place? Yes  Time? Yes  Displays appropriate judgment?Yes  Can read the correct time from a watch face? Unable to see  Are you having problems remembering things? Sometimes   Advanced Directives have been discussed with the patient?Yes    List the Names of Other Physician/Practitioners you currently use:  Dr Benjamine Mola (ENT )  Dr Ishmael Holter (allergy)  Dr Aline Brochure (ortho)    Indicate any recent Medical Services you may have received from other than [facility] providers in the past year (date may be approximate).   Assessment:    Welcome to medicare  Plan:    Patient Instructions (the written plan) was given to the patient.  Medicare Attestation  I have personally reviewed:  The patient's medical and social history  Their use of alcohol, tobacco or illicit drugs  Their current medications and supplements  The patient's functional ability including ADLs,fall risks, home safety risks, cognitive, and hearing and visual impairment  Diet and physical activities  Evidence for depression or mood disorders  The patient's weight, height, BMI, and visual acuity have been recorded in the chart. I have made referrals, counseling, and provided education to the patient based on review of the above and I have provided the patient  with a written personalized care plan for preventive services.      Review of Systems     Objective:   Physical Exam  BP 130/84 mmHg  Pulse 82  Resp 16  Ht 5\' 6"  (1.676 m)  Wt 206 lb (93.441 kg)  BMI 33.27 kg/m2  SpO2 97%  EKG:NSR, no ischemia, non specific t wave abnormality, improved since 2016 tracing  MS: decreased ROM lumbar spine and right hip  Psych: positive depression  screen , not suicidal or homicidal      Assessment & Plan:  Welcome to Medicare preventive visit Annual exam as documented. Counseling done  re healthy lifestyle involving commitment to 150 minutes exercise per week, heart healthy diet, and attaining healthy weight.The importance of adequate sleep also discussed. Regular seat belt use and home safety, is also discussed. Changes in health habits are decided on by the patient with goals and time frames  set for achieving them. Immunization and cancer screening needs are specifically addressed at this visit.   HIP PAIN, RIGHT Uncontrolled.Toradol and depo medrol administered IM in the office , to be followed by a short course of oral prednisone and NSAIDS.   GERD (gastroesophageal reflux disease) C/o heartburn and symptoms of regurgitation and reflux, will start protonix and educate  Depression Inadequately treated, not suicidal or homicidal, dose increase in paxil, f/u in 6 to 8 weeks  Essential hypertension Controlled, no change in medication Abnormal EKG in 2016 , pr denies chest pain, palpitation, increased fatigue  Or oorthopnea EKG on day of visit: sinus rhythm, no acute ischemia or LVH, non specific t wave abnormality, much improved from 2016 tracing. No need for cardiology eval at this time

## 2015-12-13 NOTE — Patient Instructions (Signed)
Annual physical exam March 12 or after with Depression screen, call if you need me sooner  Increase dose of paxil to 20 m g daily for depression , starting today  EKG looks good, no need to see cardiologist  Injections and ibuprofen and prednisone sent for back and hip pain  New medication for reflux symptoms, protonix, reduce caffeine intake also  Flu vaccine today

## 2015-12-14 LAB — HEMOGLOBIN A1C
Hgb A1c MFr Bld: 5.9 % — ABNORMAL HIGH (ref <5.7)
Mean Plasma Glucose: 123 mg/dL — ABNORMAL HIGH (ref <117)

## 2015-12-14 LAB — HIV ANTIBODY (ROUTINE TESTING W REFLEX): HIV 1&2 Ab, 4th Generation: NONREACTIVE

## 2015-12-14 LAB — HEPATITIS C ANTIBODY: HCV Ab: NEGATIVE

## 2015-12-17 ENCOUNTER — Encounter: Payer: Self-pay | Admitting: Family Medicine

## 2015-12-17 NOTE — Assessment & Plan Note (Signed)
Inadequately treated, not suicidal or homicidal, dose increase in paxil, f/u in 6 to 8 weeks

## 2015-12-17 NOTE — Assessment & Plan Note (Addendum)
Controlled, no change in medication Abnormal EKG in 2016 , pr denies chest pain, palpitation, increased fatigue  Or oorthopnea EKG on day of visit: sinus rhythm, no acute ischemia or LVH, non specific t wave abnormality, much improved from 2016 tracing. No need for cardiology eval at this time

## 2015-12-18 ENCOUNTER — Other Ambulatory Visit: Payer: Self-pay | Admitting: Family Medicine

## 2015-12-30 ENCOUNTER — Other Ambulatory Visit: Payer: Self-pay | Admitting: Family Medicine

## 2016-01-04 ENCOUNTER — Other Ambulatory Visit: Payer: Self-pay | Admitting: Family Medicine

## 2016-01-05 ENCOUNTER — Other Ambulatory Visit: Payer: Self-pay | Admitting: Family Medicine

## 2016-02-19 ENCOUNTER — Ambulatory Visit: Payer: Medicare Other | Admitting: Family Medicine

## 2016-02-23 ENCOUNTER — Encounter: Payer: Self-pay | Admitting: Family Medicine

## 2016-02-23 ENCOUNTER — Ambulatory Visit (INDEPENDENT_AMBULATORY_CARE_PROVIDER_SITE_OTHER): Payer: Medicare Other | Admitting: Family Medicine

## 2016-02-23 VITALS — BP 120/82 | HR 75 | Resp 16 | Ht 66.0 in | Wt 207.0 lb

## 2016-02-23 DIAGNOSIS — F329 Major depressive disorder, single episode, unspecified: Secondary | ICD-10-CM | POA: Diagnosis not present

## 2016-02-23 DIAGNOSIS — F411 Generalized anxiety disorder: Secondary | ICD-10-CM

## 2016-02-23 DIAGNOSIS — E785 Hyperlipidemia, unspecified: Secondary | ICD-10-CM | POA: Diagnosis not present

## 2016-02-23 DIAGNOSIS — J45991 Cough variant asthma: Secondary | ICD-10-CM | POA: Diagnosis not present

## 2016-02-23 DIAGNOSIS — Z1211 Encounter for screening for malignant neoplasm of colon: Secondary | ICD-10-CM | POA: Diagnosis not present

## 2016-02-23 DIAGNOSIS — E669 Obesity, unspecified: Secondary | ICD-10-CM

## 2016-02-23 DIAGNOSIS — I10 Essential (primary) hypertension: Secondary | ICD-10-CM

## 2016-02-23 DIAGNOSIS — R7303 Prediabetes: Secondary | ICD-10-CM

## 2016-02-23 DIAGNOSIS — M25559 Pain in unspecified hip: Secondary | ICD-10-CM

## 2016-02-23 DIAGNOSIS — Z91048 Other nonmedicinal substance allergy status: Secondary | ICD-10-CM

## 2016-02-23 DIAGNOSIS — Z9109 Other allergy status, other than to drugs and biological substances: Secondary | ICD-10-CM

## 2016-02-23 DIAGNOSIS — F32A Depression, unspecified: Secondary | ICD-10-CM

## 2016-02-23 DIAGNOSIS — R7301 Impaired fasting glucose: Secondary | ICD-10-CM

## 2016-02-23 LAB — POC HEMOCCULT BLD/STL (OFFICE/1-CARD/DIAGNOSTIC): Fecal Occult Blood, POC: NEGATIVE

## 2016-02-23 MED ORDER — PREDNISONE 5 MG PO TABS: 5.0000 mg | ORAL_TABLET | Freq: Two times a day (BID) | ORAL | Status: AC

## 2016-02-23 NOTE — Progress Notes (Signed)
Subjective:    Patient ID: [name], female    DOB: 23-Jun-1951, 65 y.o.   MRN: [id number]  HPI   [name]     MRN: [id number]      DOB: 03/19/1951   HPI Ms. Mcbrearty is here for follow up and re-evaluation of chronic medical conditions, medication management and review of any available recent lab and radiology data.  Preventive health is updated, specifically  Cancer screening and Immunization.   Questions or concerns regarding consultations or procedures which the PT has had in the interim are  addressed. The PT denies any adverse reactions to current medications since the last visit. Has responded well to paxil and denies adverse s/e, less anxiety an panic attacks, less episodes of "wheezing" C/o increase hip and leg pain in apst 3 days, does not want injections at this visit  ROS Denies recent fever or chills. Denies sinus pressure, nasal congestion, ear pain or sore throat. Denies chest congestion, productive cough or wheezing. Denies chest pains, palpitations and leg swelling Denies abdominal pain, nausea, vomiting,diarrhea or constipation.   Denies dysuria, frequency, hesitancy or incontinence. Denies headaches, seizures, numbness, or tingling. Denies uncontrolled  depression, anxiety or insomnia. Denies skin break down or rash.   PE  BP 120/82 mmHg  Pulse 75  Resp 16  Ht 5\' 6"  (1.676 m)  Wt 207 lb (93.895 kg)  BMI 33.43 kg/m2  SpO2 98%  Patient alert and oriented and in no cardiopulmonary distress.  HEENT: No facial asymmetry, EOMI,   oropharynx pink and moist.  Neck supple no JVD, no mass.  Chest: Clear to auscultation bilaterally.  CVS: S1, S2 no murmurs, no S3.Regular rate.  ABD: Soft non tender. No organomegaly or mass palpable. Normal BS, no bruit Rectal; no mass, heme negative stool  Ext: No edema  MS: decreased  ROM spine, , hips and knees.  Skin: Intact, no ulcerations or rash noted.  Psych: Good eye contact, normal affect.  Memory intact not anxious or depressed appearing.  CNS: CN 3-12 intact, grade 4 power in RLE , otherwise normal Assessment & Plan   Essential hypertension Controlled, no change in medication DASH diet and commitment to daily physical activity for a minimum of 30 minutes discussed and encouraged, as a part of hypertension management. The importance of attaining a healthy weight is also discussed.  BP/Weight 02/23/2016 12/13/2015 10/24/2015 05/03/2015 03/26/2015 01/16/2015 [id number]  Systolic BP 123456 [id number] 123456 123456  Diastolic BP 82 84 82 82 79 82 82  Wt. (Lbs) 207 206 - 207.12 204 204 205  BMI 33.43 33.27 - 32.43 31.94 31.94 32.1        Asthma, cough variant Controlled, no change in medication   Depression Improved, no med change, reduced anxiety and panic  Hyperlipidemia LDL goal <100 Hyperlipidemia:Low fat diet discussed and encouraged. Not at goal  Lipid Panel  Lab Results  Component Value Date   CHOL 205* 12/13/2015   HDL 84 12/13/2015   LDLCALC 96 12/13/2015   TRIG 123 12/13/2015   CHOLHDL 2.4 12/13/2015   Updated lab needed at/ before next visit.     HIP PAIN, RIGHT Chronic pain with mild flare, 5 day course of prednisone prescribed  GERD (gastroesophageal reflux disease) Controlled, no change in medication   Multiple environmental allergies Controlled, no change in medication   GAD (generalized anxiety disorder) Marked improvement on paxil, continue at current dose, reviewed techniques to calm herself down when she feels herself becoming  anxious also  Prediabetes Updated lab needed at/ before next visit.  Patient educated about the importance of limiting  Carbohydrate intake , the need to commit to daily physical activity for a minimum of 30 minutes , and to commit weight loss. The fact that changes in all these areas will reduce or eliminate all together the development of diabetes is stressed.   Diabetic Labs Latest Ref Rng 12/13/2015 05/03/2015  03/26/2015 01/16/2015 08/25/2014  HbA1c <5.7 % 5.9(H) 5.8(H) - 5.8(H) 6.0(H)  Chol 125 - 200 mg/dL 205(H) 197 - - 163  HDL >=46 mg/dL 84 91 - - 73  Calc LDL <130 mg/dL 96 92 - - 72  Triglycerides <150 mg/dL 123 71 - - 91  Creatinine 0.50 - 0.99 mg/dL 0.76 - 0.82 0.77 0.77   BP/Weight 02/23/2016 12/13/2015 10/24/2015 05/03/2015 03/26/2015 01/16/2015 [id number]  Systolic BP 123456 [id number] 123456 123456  Diastolic BP 82 84 82 82 79 82 82  Wt. (Lbs) 207 206 - 207.12 204 204 205  BMI 33.43 33.27 - 32.43 31.94 31.94 32.1   No flowsheet data found.     Obesity (BMI 30-39.9) Unchanged Patient re-educated about  the importance of commitment to a  minimum of 150 minutes of exercise per week.  The importance of healthy food choices with portion control discussed. Encouraged to start a food diary, count calories and to consider  joining a support group. Sample diet sheets offered. Goals set by the patient for the next several months.   Weight /BMI 02/23/2016 12/13/2015 05/03/2015  WEIGHT 207 lb 206 lb 207 lb 1.9 oz  HEIGHT 5\' 6"  5\' 6"  5\' 7"   BMI 33.43 kg/m2 33.27 kg/m2 32.43 kg/m2    Current exercise per week 90 minutes.       Review of Systems     Objective:   Physical Exam        Assessment & Plan:

## 2016-02-23 NOTE — Assessment & Plan Note (Signed)
Improved, no med change, reduced anxiety and panic

## 2016-02-23 NOTE — Patient Instructions (Signed)
F/u last week in August, call if you need me before  Fasting lipid, cmp , HBa1C, TSh,  In August before visit  Five day course of prednisone sent for hip and leg pain  Keep up the exercise and healthy food choice for improving health  Good exam today  Thanks for choosing [facility], we consider it a privelige to serve you.

## 2016-02-23 NOTE — Assessment & Plan Note (Addendum)
Hyperlipidemia:Low fat diet discussed and encouraged. Not at goal  Lipid Panel  Lab Results  Component Value Date   CHOL 205* 12/13/2015   HDL 84 12/13/2015   LDLCALC 96 12/13/2015   TRIG 123 12/13/2015   CHOLHDL 2.4 12/13/2015   Updated lab needed at/ before next visit.

## 2016-02-23 NOTE — Assessment & Plan Note (Signed)
Controlled, no change in medication  

## 2016-02-23 NOTE — Assessment & Plan Note (Addendum)
Chronic pain with mild flare, 5 day course of prednisone prescribed

## 2016-02-23 NOTE — Assessment & Plan Note (Signed)
Controlled, no change in medication DASH diet and commitment to daily physical activity for a minimum of 30 minutes discussed and encouraged, as a part of hypertension management. The importance of attaining a healthy weight is also discussed.  BP/Weight 02/23/2016 12/13/2015 10/24/2015 05/03/2015 03/26/2015 01/16/2015 [id number]  Systolic BP 123456 [id number] 123456 123456  Diastolic BP 82 84 82 82 79 82 82  Wt. (Lbs) 207 206 - 207.12 204 204 205  BMI 33.43 33.27 - 32.43 31.94 31.94 32.1

## 2016-02-25 NOTE — Assessment & Plan Note (Signed)
Updated lab needed at/ before next visit.  Patient educated about the importance of limiting  Carbohydrate intake , the need to commit to daily physical activity for a minimum of 30 minutes , and to commit weight loss. The fact that changes in all these areas will reduce or eliminate all together the development of diabetes is stressed.   Diabetic Labs Latest Ref Rng 12/13/2015 05/03/2015 03/26/2015 01/16/2015 08/25/2014  HbA1c <5.7 % 5.9(H) 5.8(H) - 5.8(H) 6.0(H)  Chol 125 - 200 mg/dL 205(H) 197 - - 163  HDL >=46 mg/dL 84 91 - - 73  Calc LDL <130 mg/dL 96 92 - - 72  Triglycerides <150 mg/dL 123 71 - - 91  Creatinine 0.50 - 0.99 mg/dL 0.76 - 0.82 0.77 0.77   BP/Weight 02/23/2016 12/13/2015 10/24/2015 05/03/2015 03/26/2015 01/16/2015 [id number]  Systolic BP 123456 [id number] 123456 123456  Diastolic BP 82 84 82 82 79 82 82  Wt. (Lbs) 207 206 - 207.12 204 204 205  BMI 33.43 33.27 - 32.43 31.94 31.94 32.1   No flowsheet data found.

## 2016-02-25 NOTE — Assessment & Plan Note (Signed)
Marked improvement on paxil, continue at current dose, reviewed techniques to calm herself down when she feels herself becoming anxious also

## 2016-02-25 NOTE — Assessment & Plan Note (Signed)
Unchanged Patient re-educated about  the importance of commitment to a  minimum of 150 minutes of exercise per week.  The importance of healthy food choices with portion control discussed. Encouraged to start a food diary, count calories and to consider  joining a support group. Sample diet sheets offered. Goals set by the patient for the next several months.   Weight /BMI 02/23/2016 12/13/2015 05/03/2015  WEIGHT 207 lb 206 lb 207 lb 1.9 oz  HEIGHT 5\' 6"  5\' 6"  5\' 7"   BMI 33.43 kg/m2 33.27 kg/m2 32.43 kg/m2    Current exercise per week 90 minutes.

## 2016-02-25 NOTE — Assessment & Plan Note (Signed)
Controlled, no change in medication  

## 2016-02-28 ENCOUNTER — Other Ambulatory Visit: Payer: Self-pay | Admitting: Family Medicine

## 2016-03-13 IMAGING — DX DG CHEST 2V
2 series · 2 of 2 positions shown · non-contrast
Comparison: 03/31/2011

CLINICAL DATA: Shortness of breath.

EXAM:
CHEST  2 VIEW

[chest pa]
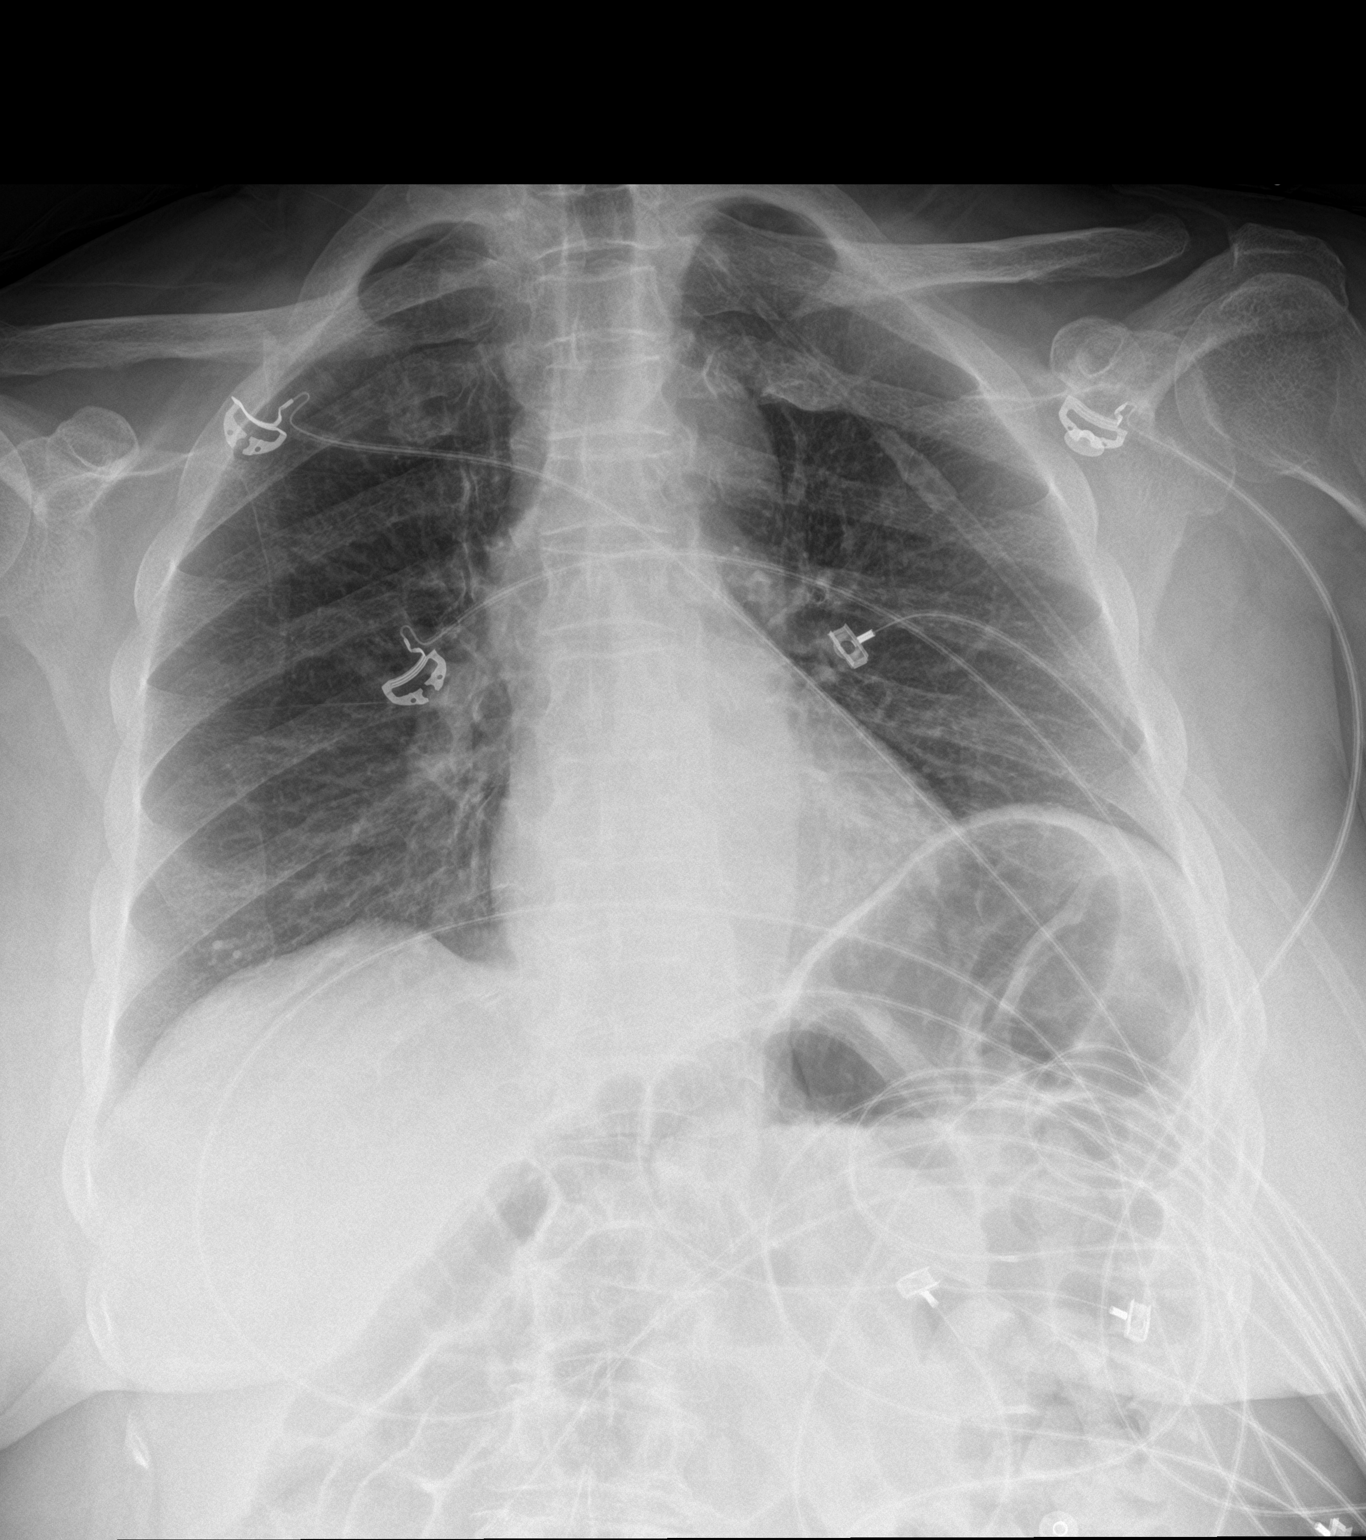

[chest lat]
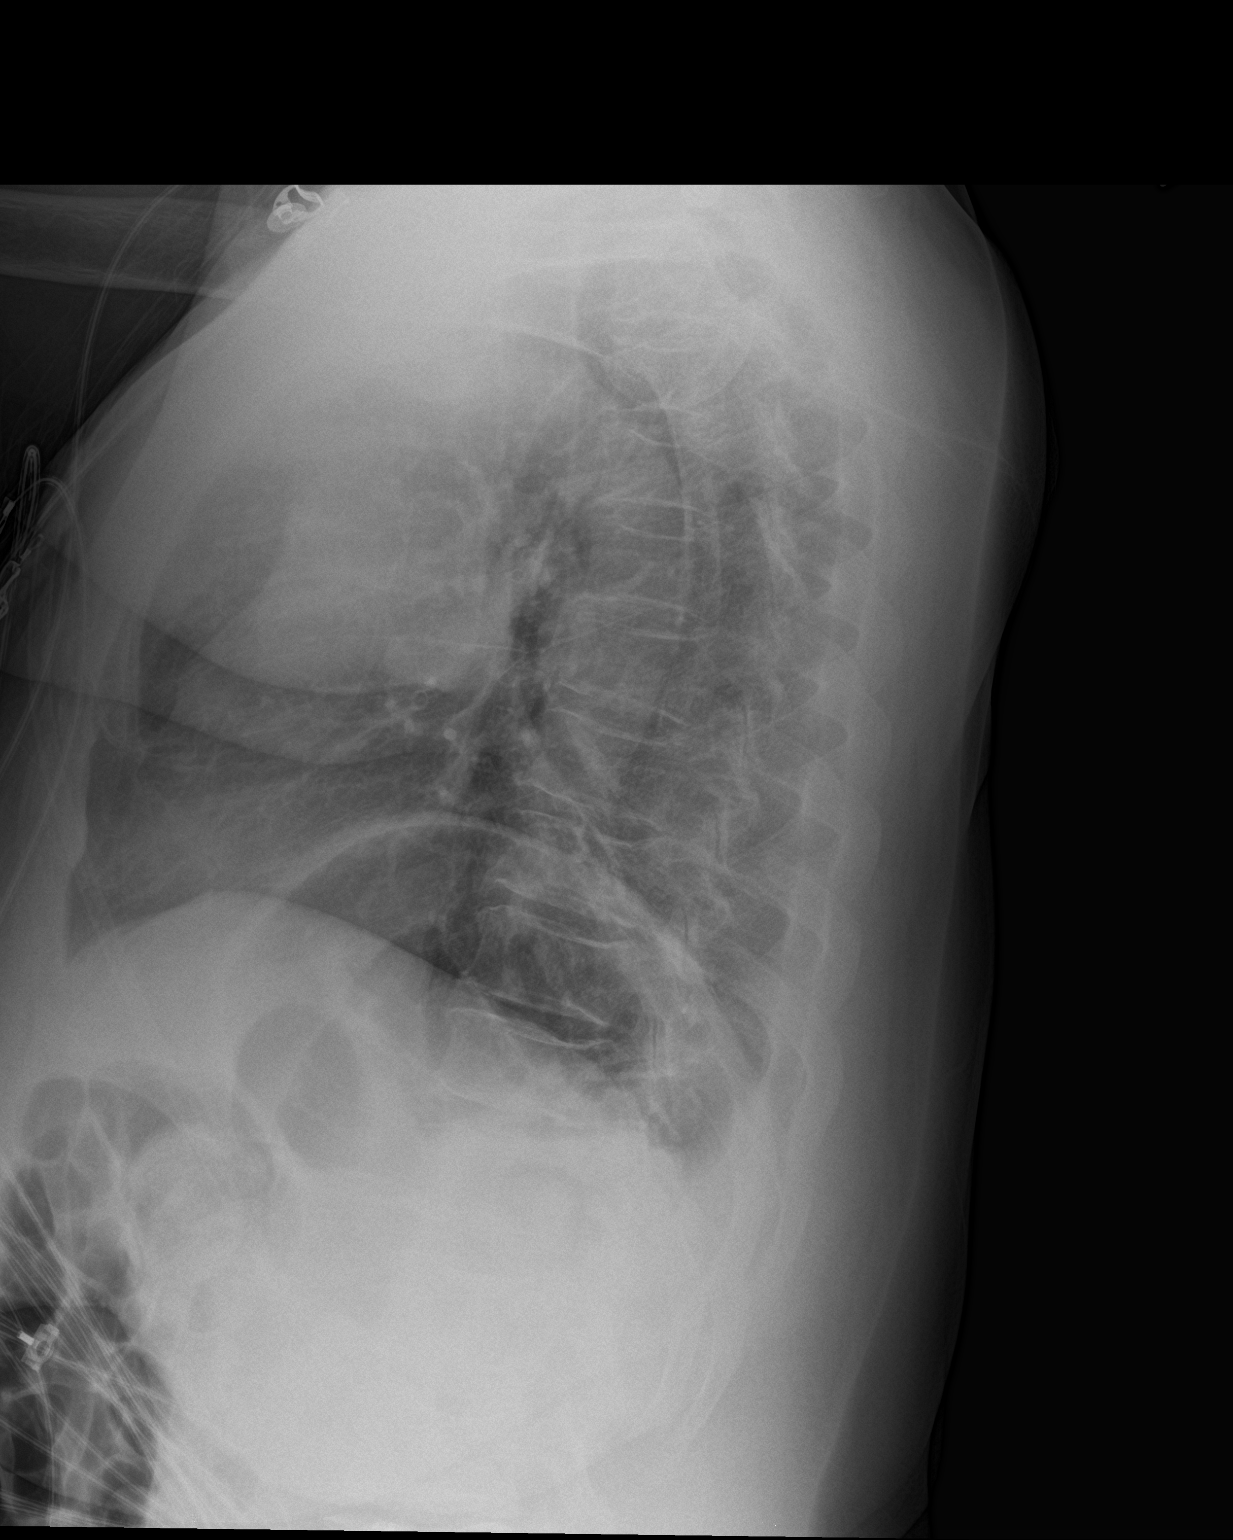

[2 of 2 positions shown; findings below may reference images not displayed]

FINDINGS: The cardiac silhouette, mediastinal and hilar contours are within
normal limits and stable. The lungs are clear. No pleural effusion.
The bony thorax is intact.
IMPRESSION: No acute cardiopulmonary findings.

## 2016-04-01 ENCOUNTER — Other Ambulatory Visit: Payer: Self-pay | Admitting: Family Medicine

## 2016-04-22 ENCOUNTER — Other Ambulatory Visit: Payer: Self-pay | Admitting: Family Medicine

## 2016-04-25 ENCOUNTER — Other Ambulatory Visit: Payer: Self-pay

## 2016-04-25 MED ORDER — LOVASTATIN 40 MG PO TABS: ORAL_TABLET | ORAL | Status: DC

## 2016-04-25 MED ORDER — NABUMETONE 750 MG PO TABS: 750.0000 mg | ORAL_TABLET | Freq: Every day | ORAL | Status: DC

## 2016-04-25 MED ORDER — AMLODIPINE BESYLATE 10 MG PO TABS: 10.0000 mg | ORAL_TABLET | Freq: Every day | ORAL | Status: DC

## 2016-04-26 ENCOUNTER — Other Ambulatory Visit: Payer: Self-pay

## 2016-04-26 MED ORDER — MONTELUKAST SODIUM 10 MG PO TABS: 10.0000 mg | ORAL_TABLET | Freq: Every day | ORAL | Status: DC

## 2016-06-03 ENCOUNTER — Other Ambulatory Visit: Payer: Self-pay | Admitting: Family Medicine

## 2016-06-18 ENCOUNTER — Other Ambulatory Visit: Payer: Self-pay | Admitting: Family Medicine

## 2016-06-18 DIAGNOSIS — Z1231 Encounter for screening mammogram for malignant neoplasm of breast: Secondary | ICD-10-CM

## 2016-06-24 ENCOUNTER — Ambulatory Visit (HOSPITAL_COMMUNITY)
Admission: RE | Admit: 2016-06-24 | Discharge: 2016-06-24 | Disposition: A | Discharge status: Home / Self Care | Payer: Medicare Other | Source: Ambulatory Visit | Attending: Family Medicine | Admitting: Family Medicine

## 2016-06-24 DIAGNOSIS — Z1231 Encounter for screening mammogram for malignant neoplasm of breast: Secondary | ICD-10-CM | POA: Insufficient documentation

## 2016-08-05 ENCOUNTER — Ambulatory Visit (INDEPENDENT_AMBULATORY_CARE_PROVIDER_SITE_OTHER): Payer: Medicare Other | Admitting: Family Medicine

## 2016-08-05 ENCOUNTER — Encounter: Payer: Self-pay | Admitting: Family Medicine

## 2016-08-05 ENCOUNTER — Other Ambulatory Visit: Payer: Self-pay | Admitting: Family Medicine

## 2016-08-05 VITALS — BP 120/82 | HR 73 | Resp 16 | Ht 66.0 in | Wt 206.0 lb

## 2016-08-05 DIAGNOSIS — I10 Essential (primary) hypertension: Secondary | ICD-10-CM | POA: Diagnosis not present

## 2016-08-05 DIAGNOSIS — R7303 Prediabetes: Secondary | ICD-10-CM

## 2016-08-05 DIAGNOSIS — F411 Generalized anxiety disorder: Secondary | ICD-10-CM

## 2016-08-05 DIAGNOSIS — N760 Acute vaginitis: Secondary | ICD-10-CM

## 2016-08-05 DIAGNOSIS — E785 Hyperlipidemia, unspecified: Secondary | ICD-10-CM

## 2016-08-05 DIAGNOSIS — Z23 Encounter for immunization: Secondary | ICD-10-CM | POA: Diagnosis not present

## 2016-08-05 DIAGNOSIS — M5137 Other intervertebral disc degeneration, lumbosacral region: Secondary | ICD-10-CM | POA: Diagnosis not present

## 2016-08-05 DIAGNOSIS — F32A Depression, unspecified: Secondary | ICD-10-CM

## 2016-08-05 DIAGNOSIS — R7989 Other specified abnormal findings of blood chemistry: Secondary | ICD-10-CM | POA: Diagnosis not present

## 2016-08-05 DIAGNOSIS — F329 Major depressive disorder, single episode, unspecified: Secondary | ICD-10-CM

## 2016-08-05 LAB — COMPREHENSIVE METABOLIC PANEL
ALT: 26 U/L (ref 6–29)
AST: 26 U/L (ref 10–35)
Albumin: 3.9 g/dL (ref 3.6–5.1)
Alkaline Phosphatase: 45 U/L (ref 33–130)
BUN: 20 mg/dL (ref 7–25)
CO2: 27 mmol/L (ref 20–31)
Calcium: 9.5 mg/dL (ref 8.6–10.4)
Chloride: 103 mmol/L (ref 98–110)
Creat: 0.85 mg/dL (ref 0.50–0.99)
Glucose, Bld: 96 mg/dL (ref 65–99)
Potassium: 3.4 mmol/L — ABNORMAL LOW (ref 3.5–5.3)
Sodium: 142 mmol/L (ref 135–146)
Total Bilirubin: 0.4 mg/dL (ref 0.2–1.2)
Total Protein: 7.1 g/dL (ref 6.1–8.1)

## 2016-08-05 LAB — LIPID PANEL
Cholesterol: 181 mg/dL (ref 125–200)
HDL: 65 mg/dL (ref >=46)
LDL Cholesterol: 96 mg/dL (ref <130)
Total CHOL/HDL Ratio: 2.8 Ratio (ref <=5.0)
Triglycerides: 98 mg/dL (ref <150)
VLDL: 20 mg/dL (ref <30)

## 2016-08-05 LAB — CBC
HCT: 38.3 % (ref 35.0–45.0)
Hemoglobin: 13.2 g/dL (ref 11.7–15.5)
MCH: 32.4 pg (ref 27.0–33.0)
MCHC: 34.5 g/dL (ref 32.0–36.0)
MCV: 94.1 fL (ref 80.0–100.0)
MPV: 12.6 fL — ABNORMAL HIGH (ref 7.5–12.5)
Platelets: 243 10*3/uL (ref 140–400)
RBC: 4.07 MIL/uL (ref 3.80–5.10)
RDW: 13.1 % (ref 11.0–15.0)
WBC: 6.6 10*3/uL (ref 3.8–10.8)

## 2016-08-05 LAB — TSH: TSH: 6.86 mIU/L — ABNORMAL HIGH

## 2016-08-05 LAB — HEMOGLOBIN A1C
Hgb A1c MFr Bld: 5.7 % — ABNORMAL HIGH (ref <5.7)
Mean Plasma Glucose: 117 mg/dL

## 2016-08-05 MED ORDER — METHYLPREDNISOLONE ACETATE 80 MG/ML IJ SUSP
80.0000 mg | Freq: Once | INTRAMUSCULAR | Status: AC
  Administered 2016-08-05: 80 mg via INTRAMUSCULAR

## 2016-08-05 MED ORDER — KETOROLAC TROMETHAMINE 60 MG/2ML IM SOLN
60.0000 mg | Freq: Once | INTRAMUSCULAR | Status: AC
  Administered 2016-08-05: 60 mg via INTRAMUSCULAR

## 2016-08-05 NOTE — Assessment & Plan Note (Addendum)
Controlled, no change in medication DASH diet and commitment to daily physical activity for a minimum of 30 minutes discussed and encouraged, as a part of hypertension management. The importance of attaining a healthy weight is also discussed.  BP/Weight 08/05/2016 02/23/2016 12/13/2015 10/24/2015 05/03/2015 [id number]  Systolic BP 123456 123456 [id number] [id number]  Diastolic BP 82 82 84 82 82 79 82  Wt. (Lbs) 206 207 206 - 207.12 204 204  BMI 33.25 33.43 33.27 - 32.43 31.94 31.94

## 2016-08-05 NOTE — Assessment & Plan Note (Signed)
After obtaining informed consent, the vaccine is  administered by LPN.  

## 2016-08-05 NOTE — Assessment & Plan Note (Signed)
Uncontrolled.Toradol and depo medrol administered IM in the office , to be followed by a short course of oral prednisone and NSAIDS.  

## 2016-08-05 NOTE — Assessment & Plan Note (Signed)
Hyperlipidemia:Low fat diet discussed and encouraged.   Lipid Panel  Lab Results  Component Value Date   CHOL 205 (H) 12/13/2015   HDL 84 12/13/2015   LDLCALC 96 12/13/2015   TRIG 123 12/13/2015   CHOLHDL 2.4 12/13/2015   Updated lab needed at/ before next visit.

## 2016-08-05 NOTE — Assessment & Plan Note (Signed)
Controlled, no change in medication  

## 2016-08-05 NOTE — Progress Notes (Signed)
   [name]     MRN: [id number]      DOB: December 13, 1950   HPI Ms. Antonson is here for follow up and re-evaluation of chronic medical conditions, medication management and review of any available recent lab and radiology data.  Preventive health is updated, specifically  Cancer screening and Immunization.   Questions or concerns regarding consultations or procedures which the PT has had in the interim are  addressed. The PT denies any adverse reactions to current medications since the last visit.  C/o back pain and requests vaccines for this  ROS Denies recent fever or chills. Denies sinus pressure, nasal congestion, ear pain or sore throat. Denies chest congestion, productive cough or wheezing. Denies chest pains, palpitations and leg swelling Denies abdominal pain, nausea, vomiting,diarrhea or constipation.   Denies dysuria, frequency, hesitancy or incontinence. C/o vaginal burning and stinging on urination for months  Denies headaches, seizures, numbness, or tingling. Denies depression, anxiety or insomnia. Denies skin break down or rash.   PE  BP 120/82   Pulse 73   Resp 16   Ht 5\' 6"  (1.676 m)   Wt 206 lb (93.4 kg)   SpO2 96%   BMI 33.25 kg/m   Patient alert and oriented and in no cardiopulmonary distress.  HEENT: No facial asymmetry, EOMI,   oropharynx pink and moist.  Neck supple no JVD, no mass.  Chest: Clear to auscultation bilaterally.  CVS: S1, S2 no murmurs, no S3.Regular rate.  ABD: Soft non tender.   Ext: No edema  MS: Decreased  ROM spine, adequate in shoulders, hips and knees.  Skin: Intact, no ulcerations or rash noted.  Psych: Good eye contact, normal affect. Memory intact not anxious or depressed appearing.  CNS: CN 2-12 intact, power,  normal throughout.no focal deficits noted.   Assessment & Plan  Essential hypertension Controlled, no change in medication DASH diet and commitment to daily physical activity for a minimum of 30  minutes discussed and encouraged, as a part of hypertension management. The importance of attaining a healthy weight is also discussed.  BP/Weight 08/05/2016 02/23/2016 12/13/2015 10/24/2015 05/03/2015 [id number]  Systolic BP 123456 123456 [id number] 123456  Diastolic BP 82 82 84 82 82 79 82  Wt. (Lbs) 206 207 206 - 207.12 204 204  BMI 33.25 33.43 33.27 - 32.43 31.94 31.94        Hyperlipidemia LDL goal <100 Hyperlipidemia:Low fat diet discussed and encouraged.   Lipid Panel  Lab Results  Component Value Date   CHOL 205 (H) 12/13/2015   HDL 84 12/13/2015   LDLCALC 96 12/13/2015   TRIG 123 12/13/2015   CHOLHDL 2.4 12/13/2015   Updated lab needed at/ before next visit.     DDD (degenerative disc disease), lumbosacral Uncontrolled.Toradol and depo medrol administered IM in the office , to be followed by a short course of oral prednisone and NSAIDS.   GAD (generalized anxiety disorder) Controlled, no change in medication   Depression Controlled, no change in medication   Need for 23-polyvalent pneumococcal polysaccharide vaccine After obtaining informed consent, the vaccine is  administered by LPN.

## 2016-08-05 NOTE — Patient Instructions (Addendum)
Annual wellness Jan 4 or after, call if you need mebefore  Flu and pneumonia vaccines today  Toradol and depo medrol for back and right hip pain today  Fasting labs today  Urine being checked for infection  Blood test to see if viral infection causing burning, if not, then you will  Be prescribed premarin cream to use sparingly twice weekly , we will call about that   Thank you  for choosing [facility]. We consider it a privelige to serve you.  Delivering excellent health care in a caring and  compassionate way is our goal.  Partnering with you,  so that together we can achieve this goal is our strategy.

## 2016-08-06 LAB — HSV 2 ANTIBODY, IGG: HSV 2 Glycoprotein G Ab, IgG: 7.52 Index — ABNORMAL HIGH (ref <0.90)

## 2016-08-07 ENCOUNTER — Other Ambulatory Visit: Payer: Self-pay | Admitting: Family Medicine

## 2016-08-08 LAB — T3, FREE: T3, Free: 2.7 pg/mL (ref 2.3–4.2)

## 2016-08-08 LAB — T4, FREE: Free T4: 0.9 ng/dL (ref 0.8–1.8)

## 2016-08-15 ENCOUNTER — Other Ambulatory Visit: Payer: Self-pay

## 2016-08-15 MED ORDER — MONTELUKAST SODIUM 10 MG PO TABS: 10.0000 mg | ORAL_TABLET | Freq: Every day | ORAL | 3 refills | Status: DC

## 2016-08-15 MED ORDER — ACYCLOVIR 400 MG PO TABS: 400.0000 mg | ORAL_TABLET | Freq: Three times a day (TID) | ORAL | 0 refills | Status: DC

## 2016-08-19 ENCOUNTER — Other Ambulatory Visit: Payer: Self-pay | Admitting: Family Medicine

## 2016-08-30 ENCOUNTER — Other Ambulatory Visit: Payer: Self-pay | Admitting: Family Medicine

## 2016-09-24 ENCOUNTER — Other Ambulatory Visit: Payer: Self-pay | Admitting: Family Medicine

## 2016-10-02 ENCOUNTER — Other Ambulatory Visit: Payer: Self-pay | Admitting: Family Medicine

## 2016-10-03 ENCOUNTER — Other Ambulatory Visit: Payer: Self-pay | Admitting: Family Medicine

## 2016-11-18 ENCOUNTER — Other Ambulatory Visit: Payer: Self-pay | Admitting: Family Medicine

## 2016-11-28 ENCOUNTER — Other Ambulatory Visit: Payer: Self-pay | Admitting: Family Medicine

## 2016-12-19 ENCOUNTER — Other Ambulatory Visit: Payer: Self-pay | Admitting: Family Medicine

## 2016-12-30 ENCOUNTER — Ambulatory Visit (INDEPENDENT_AMBULATORY_CARE_PROVIDER_SITE_OTHER): Payer: Medicare Other

## 2016-12-30 VITALS — BP 132/80 | HR 62 | Temp 98.6°F | Ht 66.0 in | Wt 205.0 lb

## 2016-12-30 DIAGNOSIS — E042 Nontoxic multinodular goiter: Secondary | ICD-10-CM | POA: Diagnosis not present

## 2016-12-30 DIAGNOSIS — I10 Essential (primary) hypertension: Secondary | ICD-10-CM

## 2016-12-30 DIAGNOSIS — H9201 Otalgia, right ear: Secondary | ICD-10-CM

## 2016-12-30 DIAGNOSIS — E785 Hyperlipidemia, unspecified: Secondary | ICD-10-CM | POA: Diagnosis not present

## 2016-12-30 DIAGNOSIS — M899 Disorder of bone, unspecified: Secondary | ICD-10-CM

## 2016-12-30 DIAGNOSIS — Z0000 Encounter for general adult medical examination without abnormal findings: Secondary | ICD-10-CM | POA: Diagnosis not present

## 2016-12-30 DIAGNOSIS — M949 Disorder of cartilage, unspecified: Secondary | ICD-10-CM

## 2016-12-30 DIAGNOSIS — R7303 Prediabetes: Secondary | ICD-10-CM

## 2016-12-30 MED ORDER — KETOROLAC TROMETHAMINE 60 MG/2ML IM SOLN
60.0000 mg | Freq: Once | INTRAMUSCULAR | Status: AC
  Administered 2016-12-30: 60 mg via INTRAMUSCULAR

## 2016-12-30 MED ORDER — METHYLPREDNISOLONE ACETATE 80 MG/ML IJ SUSP
80.0000 mg | Freq: Once | INTRAMUSCULAR | Status: AC
  Administered 2016-12-30: 80 mg via INTRAMUSCULAR

## 2016-12-30 NOTE — Patient Instructions (Addendum)
Health maintenance:Up to date   Abnormal screenings: None   Patient concerns: Right hip pain and right ear pain, nurse visit scheduled today for injection. Dr. Moshe Cipro aware. Dental concerns as well as exercise classes. Community resource referral sent today. You should expect    Nurse concerns:BMI, watch your carbohydrate and sugar intake.   Next PCP appt: In 4 months with Dr. Moshe Cipro for Rectal exam. Follow up in 1 year for your annual wellness visit. Labs have been ordered today, please have these completed before your next visit with Dr. Moshe Cipro.  Advance directive discussed with patient today. Copy provided for patient to complete at home and have notarized. Patient agrees to have copy sent to our office once it is complete.    Fall Prevention in the Home Introduction Falls can cause injuries. They can happen to people of all ages. There are many things you can do to make your home safe and to help prevent falls. What can I do on the outside of my home?  Regularly fix the edges of walkways and driveways and fix any cracks.  Remove anything that might make you trip as you walk through a door, such as a raised step or threshold.  Trim any bushes or trees on the path to your home.  Use bright outdoor lighting.  Clear any walking paths of anything that might make someone trip, such as rocks or tools.  Regularly check to see if handrails are loose or broken. Make sure that both sides of any steps have handrails.  Any raised decks and porches should have guardrails on the edges.  Have any leaves, snow, or ice cleared regularly.  Use sand or salt on walking paths during winter.  Clean up any spills in your garage right away. This includes oil or grease spills. What can I do in the bathroom?  Use night lights.  Install grab bars by the toilet and in the tub and shower. Do not use towel bars as grab bars.  Use non-skid mats or decals in the tub or shower.  If you need to  sit down in the shower, use a plastic, non-slip stool.  Keep the floor dry. Clean up any water that spills on the floor as soon as it happens.  Remove soap buildup in the tub or shower regularly.  Attach bath mats securely with double-sided non-slip rug tape.  Do not have throw rugs and other things on the floor that can make you trip. What can I do in the bedroom?  Use night lights.  Make sure that you have a light by your bed that is easy to reach.  Do not use any sheets or blankets that are too big for your bed. They should not hang down onto the floor.  Have a firm chair that has side arms. You can use this for support while you get dressed.  Do not have throw rugs and other things on the floor that can make you trip. What can I do in the kitchen?  Clean up any spills right away.  Avoid walking on wet floors.  Keep items that you use a lot in easy-to-reach places.  If you need to reach something above you, use a strong step stool that has a grab bar.  Keep electrical cords out of the way.  Do not use floor polish or wax that makes floors slippery. If you must use wax, use non-skid floor wax.  Do not have throw rugs and other things on  the floor that can make you trip. What can I do with my stairs?  Do not leave any items on the stairs.  Make sure that there are handrails on both sides of the stairs and use them. Fix handrails that are broken or loose. Make sure that handrails are as long as the stairways.  Check any carpeting to make sure that it is firmly attached to the stairs. Fix any carpet that is loose or worn.  Avoid having throw rugs at the top or bottom of the stairs. If you do have throw rugs, attach them to the floor with carpet tape.  Make sure that you have a light switch at the top of the stairs and the bottom of the stairs. If you do not have them, ask someone to add them for you. What else can I do to help prevent falls?  Wear shoes that:  Do not  have high heels.  Have rubber bottoms.  Are comfortable and fit you well.  Are closed at the toe. Do not wear sandals.  If you use a stepladder:  Make sure that it is fully opened. Do not climb a closed stepladder.  Make sure that both sides of the stepladder are locked into place.  Ask someone to hold it for you, if possible.  Clearly mark and make sure that you can see:  Any grab bars or handrails.  First and last steps.  Where the edge of each step is.  Use tools that help you move around (mobility aids) if they are needed. These include:  Canes.  Walkers.  Scooters.  Crutches.  Turn on the lights when you go into a dark area. Replace any light bulbs as soon as they burn out.  Set up your furniture so you have a clear path. Avoid moving your furniture around.  If any of your floors are uneven, fix them.  If there are any pets around you, be aware of where they are.  Review your medicines with your doctor. Some medicines can make you feel dizzy. This can increase your chance of falling. Ask your doctor what other things that you can do to help prevent falls. This information is not intended to replace advice given to you by your health care provider. Make sure you discuss any questions you have with your health care provider. Document Released: 09/21/2009 Document Revised: 05/02/2016 Document Reviewed: 12/30/2014  2017 Elsevier Prediabetes Eating Plan Prediabetes-also called impaired glucose tolerance or impaired fasting glucose-is a condition that causes blood sugar (blood glucose) levels to be higher than normal. Following a healthy diet can help to keep prediabetes under control. It can also help to lower the risk of type 2 diabetes and heart disease, which are increased in people who have prediabetes. Along with regular exercise, a healthy diet:  Promotes weight loss.  Helps to control blood sugar levels.  Helps to improve the way that the body uses  insulin. What do I need to know about this eating plan?  Use the glycemic index (GI) to plan your meals. The index tells you how quickly a food will raise your blood sugar. Choose low-GI foods. These foods take a longer time to raise blood sugar.  Pay close attention to the amount of carbohydrates in the food that you eat. Carbohydrates increase blood sugar levels.  Keep track of how many calories you take in. Eating the right amount of calories will help you to achieve a healthy weight. Losing about 7 percent of  your starting weight can help to prevent type 2 diabetes.  You may want to follow a Mediterranean diet. This diet includes a lot of vegetables, lean meats or fish, whole grains, fruits, and healthy oils and fats. What foods can I eat? Grains  Whole grains, such as whole-wheat or whole-grain breads, crackers, cereals, and pasta. Unsweetened oatmeal. Bulgur. Barley. Quinoa. Brown rice. Corn or whole-wheat flour tortillas or taco shells. Vegetables  Lettuce. Spinach. Peas. Beets. Cauliflower. Cabbage. Broccoli. Carrots. Tomatoes. Squash. Eggplant. Herbs. Peppers. Onions. Cucumbers. Brussels sprouts. Fruits  Berries. Bananas. Apples. Oranges. Grapes. Papaya. Mango. Pomegranate. Kiwi. Grapefruit. Cherries. Meats and Other Protein Sources  Seafood. Lean meats, such as chicken and [place] or lean cuts of pork and beef. Tofu. Eggs. Nuts. Beans. Dairy  Low-fat or fat-free dairy products, such as yogurt, cottage cheese, and cheese. Beverages  Water. Tea. Coffee. Sugar-free or diet soda. Seltzer water. Milk. Milk alternatives, such as soy or almond milk. Condiments  Mustard. Relish. Low-fat, low-sugar ketchup. Low-fat, low-sugar barbecue sauce. Low-fat or fat-free mayonnaise. Sweets and Desserts  Sugar-free or low-fat pudding. Sugar-free or low-fat ice cream and other frozen treats. Fats and Oils  Avocado. Walnuts. Olive oil. The items listed above may not be a complete list of recommended  foods or beverages. Contact your dietitian for more options.  What foods are not recommended? Grains  Refined white flour and flour products, such as bread, pasta, snack foods, and cereals. Beverages  Sweetened drinks, such as sweet iced tea and soda. Sweets and Desserts  Baked goods, such as cake, cupcakes, pastries, cookies, and cheesecake. The items listed above may not be a complete list of foods and beverages to avoid. Contact your dietitian for more information.  This information is not intended to replace advice given to you by your health care provider. Make sure you discuss any questions you have with your health care provider. Document Released: 04/11/2015 Document Revised: 05/02/2016 Document Reviewed: 12/21/2014 Elsevier Interactive Patient Education  2017 [employer].

## 2016-12-30 NOTE — Progress Notes (Signed)
Patient given injections as ordered.   No voiced complaints.  No signs/symptoms of adverse reaction.

## 2016-12-30 NOTE — Progress Notes (Signed)
Subjective:   [name] is a 66 y.o. female who presents for an Initial Medicare Annual Wellness Visit.  Review of Systems  Cardiac Risk Factors include: advanced age (>66men, >70 women);hypertension;family history of premature cardiovascular disease;dyslipidemia;obesity (BMI >30kg/m2)     Objective:    Today's Vitals   12/30/16 0941 12/30/16 0946  BP: 132/80   Pulse: 62   Temp: 98.6 F (37 C)   TempSrc: Oral   SpO2: 96%   Weight: 205 lb 0.6 oz (93 kg)   Height: 5\' 6"  (1.676 m)   PainSc:  7    Body mass index is 33.09 kg/m.   Current Medications (verified) Outpatient Encounter Prescriptions as of 12/30/2016  Medication Sig  . acetaminophen (TYLENOL) 650 MG CR tablet Take 650 mg by mouth as needed for pain.  Marland Kitchen acyclovir (ZOVIRAX) 400 MG tablet Take 1 tablet (400 mg total) by mouth 3 (three) times daily.  Marland Kitchen albuterol (PROAIR HFA) 108 (90 BASE) MCG/ACT inhaler Two puffs every 4 hours as needed for cough or wheeze.  Marland Kitchen amLODipine (NORVASC) 10 MG tablet TAKE 1 TABLET BY MOUTH AT BEDTIME.  . Calcium Carb-Cholecalciferol (CALCIUM 500 +D) 500-400 MG-UNIT TABS Take tab twice daily (Patient taking differently: Take 1 tablet by mouth 2 (two) times daily. Take tab twice daily)  . cetirizine (ZYRTEC) 10 MG tablet TAKE 1 TABLET BY MOUTH EVERY DAY.  . fluticasone (FLONASE) 50 MCG/ACT nasal spray USE 1 SPRAY IN EACH NOSTRIL DAILY.  Marland Kitchen ibuprofen (ADVIL,MOTRIN) 200 MG tablet Take 200 mg by mouth every 6 (six) hours as needed for mild pain or moderate pain.  Marland Kitchen lovastatin (MEVACOR) 40 MG tablet TAKE (1) TABLET BY MOUTH AT BEDTIME.  . montelukast (SINGULAIR) 10 MG tablet TAKE (1) TABLET BY MOUTH AT BEDTIME.  . nabumetone (RELAFEN) 750 MG tablet TAKE ONE TABLET BY MOUTH DAILY.  . pantoprazole (PROTONIX) 20 MG tablet TAKE ONE TABLET BY MOUTH DAILY.  Marland Kitchen PARoxetine (PAXIL) 20 MG tablet TAKE ONE TABLET BY MOUTH DAILY.  Marland Kitchen QVAR 40 MCG/ACT inhaler INHALE 2 PUFFS INTO THE LUNGS TWICE DAILY. RINSE  MOUTH AFTER.  Marland Kitchen SALINE NASAL MIST NA Place 1-2 sprays into the nose daily as needed (for congestion).  . triamterene-hydrochlorothiazide (MAXZIDE-25) 37.5-25 MG tablet TAKE 1 TABLET BY MOUTH ONCE A DAY.   No facility-administered encounter medications on file as of 12/30/2016.     Allergies (verified) Lotensin hct [benazepril-hydrochlorothiazide]   History: Past Medical History:  Diagnosis Date  . Allergy   . Anxiety   . Arthritis   . Asthma   . Hyperlipidemia   . Hypertension   . Vision abnormalities    states close to blindness due to cataracts   Past Surgical History:  Procedure Laterality Date  . caesarean    . cataracts    . CESAREAN SECTION    . TOTAL ABDOMINAL HYSTERECTOMY     prolapse   Family History  Problem Relation Age of Onset  . Cancer Mother 71    stomach  . Kidney disease Mother   . Hypertension Mother   . Diabetes Father   . Cancer Father     lung   . Diabetes Sister   . Alcohol abuse Brother   . Hyperlipidemia Sister   . Diabetes Sister   . Diabetes Sister   . Diabetes Brother   . Diabetes Brother   . Diabetes Brother   . Diabetes Brother   . Cancer Daughter 40    cervix  . Arthritis    .  Asthma     Social History   Occupational History  . disabled Not Employed   Social History Main Topics  . Smoking status: Former Smoker    Packs/day: 0.50    Years: 18.00    Quit date: 1989  . Smokeless tobacco: Never Used  . Alcohol use No  . Drug use: No  . Sexual activity: Not Currently    Tobacco Counseling Counseling given: No   Activities of Daily Living In your present state of health, do you have any difficulty performing the following activities: 12/30/2016  Hearing? Y  Vision? Y  Difficulty concentrating or making decisions? Y  Walking or climbing stairs? Y  Dressing or bathing? N  Doing errands, shopping? Y  Preparing Food and eating ? Y  Using the Toilet? N  In the past six months, have you accidently leaked urine? N  Do  you have problems with loss of bowel control? N  Managing your Medications? N  Managing your Finances? Y  Housekeeping or managing your Housekeeping? N  Some recent data might be hidden    Immunizations and Health Maintenance Immunization History  Administered Date(s) Administered  . H1N1 11/15/2008  . Influenza Split 08/30/2011, 08/14/2012  . Influenza Whole 09/15/2009, 09/26/2010  . Influenza,inj,Quad PF,36+ Mos 10/18/2013, 08/25/2014, 12/13/2015, 08/05/2016  . Pneumococcal Conjugate-13 05/03/2015  . Pneumococcal Polysaccharide-23 08/05/2016  . Td 03/02/2004  . Tdap 12/12/2011   There are no preventive care reminders to display for this patient.  Patient Care Team: Fayrene Helper, MD as PCP - General Leta Baptist, MD as Consulting Physician (Otolaryngology) Carole Civil, MD as Consulting Physician (Orthopedic Surgery)  Indicate any recent Medical Services you may have received from other than [facility] providers in the past year (date may be approximate).     Assessment:   This is a routine wellness examination for [name].  Hearing/Vision screen No exam data present  Dietary issues and exercise activities discussed: Current Exercise Habits: Home exercise routine, Type of exercise: strength training/weights;stretching, Time (Minutes): 30, Frequency (Times/Week): 3, Weekly Exercise (Minutes/Week): 90, Intensity: Mild, Exercise limited by: orthopedic condition(s)  Goals    . Reduce sugar intake          Starting 12/31/2016 patient would like to decrease the amount of sweets she eats in a day. When craving sweets she will try to to replace it with a piece of fruit.      Depression Screen PHQ 2/9 Scores 12/30/2016 02/23/2016 12/13/2015 10/18/2013  PHQ - 2 Score 0 0 5 0  PHQ- 9 Score - 3 13 -    Fall Risk Fall Risk  12/30/2016 02/23/2016 12/13/2015 10/18/2013  Falls in the past year? No No No No  Risk for fall due to : Impaired vision;Impaired balance/gait - - Impaired  balance/gait;Impaired mobility;Impaired vision    Cognitive Function:  Normal   6CIT Screen 12/30/2016  What Year? 0 points  What month? 0 points  What time? 0 points  Count back from 20 0 points  Months in reverse 0 points  Repeat phrase 0 points  Total Score 0    Screening Tests Health Maintenance  Topic Date Due  . ZOSTAVAX  01/21/2019 (Originally 04/19/2010)  . MAMMOGRAM  06/24/2018  . COLONOSCOPY  05/12/2021  . TETANUS/TDAP  12/11/2021  . INFLUENZA VACCINE  Completed  . DEXA SCAN  Completed  . Hepatitis C Screening  Completed  . PNA vac Low Risk Adult  Completed      Plan:  I have personally reviewed and addressed the Medicare Annual Wellness questionnaire and have noted the following in the patient's chart:  A. Medical and social history B. Use of alcohol, tobacco or illicit drugs  C. Current medications and supplements D. Functional ability and status E.  Nutritional status F.  Physical activity G. Advance directives discussed today H. List of other physicians I.  Hospitalizations, surgeries, and ER visits in previous 12 months J.  [place] to include hearing, vision, cognitive, depression L. Referrals and appointments - none  In addition, I have reviewed and discussed with patient certain preventive protocols, quality metrics, and best practice recommendations. A written personalized care plan for preventive services as well as general preventive health recommendations were provided to patient.  Signed,   Stormy Fabian, LPN Lead Nurse Health Advisor

## 2017-01-06 ENCOUNTER — Encounter: Payer: Self-pay | Admitting: Family Medicine

## 2017-01-21 ENCOUNTER — Other Ambulatory Visit: Payer: Self-pay | Admitting: Family Medicine

## 2017-01-21 MED ORDER — BUDESONIDE-FORMOTEROL FUMARATE 80-4.5 MCG/ACT IN AERO: 2.0000 | INHALATION_SPRAY | Freq: Two times a day (BID) | RESPIRATORY_TRACT | 12 refills | Status: DC

## 2017-01-24 ENCOUNTER — Other Ambulatory Visit: Payer: Self-pay

## 2017-01-24 MED ORDER — BUDESONIDE-FORMOTEROL FUMARATE 80-4.5 MCG/ACT IN AERO: 2.0000 | INHALATION_SPRAY | Freq: Two times a day (BID) | RESPIRATORY_TRACT | 12 refills | Status: DC

## 2017-01-28 ENCOUNTER — Other Ambulatory Visit: Payer: Self-pay | Admitting: Family Medicine

## 2017-03-17 DIAGNOSIS — H26491 Other secondary cataract, right eye: Secondary | ICD-10-CM | POA: Diagnosis not present

## 2017-03-17 DIAGNOSIS — H2702 Aphakia, left eye: Secondary | ICD-10-CM | POA: Diagnosis not present

## 2017-03-17 DIAGNOSIS — Z961 Presence of intraocular lens: Secondary | ICD-10-CM | POA: Diagnosis not present

## 2017-03-17 DIAGNOSIS — H3122 Choroidal dystrophy (central areolar) (generalized) (peripapillary): Secondary | ICD-10-CM | POA: Diagnosis not present

## 2017-03-26 DIAGNOSIS — H179 Unspecified corneal scar and opacity: Secondary | ICD-10-CM | POA: Diagnosis not present

## 2017-03-31 ENCOUNTER — Other Ambulatory Visit: Payer: Self-pay | Admitting: Family Medicine

## 2017-04-21 ENCOUNTER — Other Ambulatory Visit: Payer: Self-pay | Admitting: Family Medicine

## 2017-04-21 DIAGNOSIS — R7989 Other specified abnormal findings of blood chemistry: Secondary | ICD-10-CM | POA: Diagnosis not present

## 2017-04-21 DIAGNOSIS — E042 Nontoxic multinodular goiter: Secondary | ICD-10-CM | POA: Diagnosis not present

## 2017-04-21 DIAGNOSIS — M899 Disorder of bone, unspecified: Secondary | ICD-10-CM | POA: Diagnosis not present

## 2017-04-21 DIAGNOSIS — R7303 Prediabetes: Secondary | ICD-10-CM | POA: Diagnosis not present

## 2017-04-21 DIAGNOSIS — M949 Disorder of cartilage, unspecified: Secondary | ICD-10-CM | POA: Diagnosis not present

## 2017-04-21 DIAGNOSIS — I10 Essential (primary) hypertension: Secondary | ICD-10-CM | POA: Diagnosis not present

## 2017-04-21 DIAGNOSIS — E785 Hyperlipidemia, unspecified: Secondary | ICD-10-CM | POA: Diagnosis not present

## 2017-04-21 LAB — COMPLETE METABOLIC PANEL WITH GFR
ALT: 23 U/L (ref 6–29)
AST: 23 U/L (ref 10–35)
Albumin: 3.9 g/dL (ref 3.6–5.1)
Alkaline Phosphatase: 49 U/L (ref 33–130)
BUN: 23 mg/dL (ref 7–25)
CO2: 25 mmol/L (ref 20–31)
Calcium: 9.3 mg/dL (ref 8.6–10.4)
Chloride: 105 mmol/L (ref 98–110)
Creat: 0.97 mg/dL (ref 0.50–0.99)
GFR, Est African American: 70 mL/min (ref >=60)
GFR, Est Non African American: 61 mL/min (ref >=60)
Glucose, Bld: 121 mg/dL — ABNORMAL HIGH (ref 65–99)
Potassium: 3.4 mmol/L — ABNORMAL LOW (ref 3.5–5.3)
Sodium: 142 mmol/L (ref 135–146)
Total Bilirubin: 0.3 mg/dL (ref 0.2–1.2)
Total Protein: 7.3 g/dL (ref 6.1–8.1)

## 2017-04-21 LAB — LIPID PANEL
Cholesterol: 186 mg/dL (ref <200)
HDL: 83 mg/dL (ref >50)
LDL Cholesterol: 90 mg/dL (ref <100)
Total CHOL/HDL Ratio: 2.2 Ratio (ref <5.0)
Triglycerides: 66 mg/dL (ref <150)
VLDL: 13 mg/dL (ref <30)

## 2017-04-21 LAB — TSH: TSH: 8.74 mIU/L — ABNORMAL HIGH

## 2017-04-22 LAB — HEMOGLOBIN A1C
Hgb A1c MFr Bld: 5.7 % — ABNORMAL HIGH (ref <5.7)
Mean Plasma Glucose: 117 mg/dL

## 2017-04-22 LAB — VITAMIN D 25 HYDROXY (VIT D DEFICIENCY, FRACTURES): Vit D, 25-Hydroxy: 14 ng/mL — ABNORMAL LOW (ref 30–100)

## 2017-04-24 LAB — T3, FREE: T3, Free: 2.6 pg/mL (ref 2.3–4.2)

## 2017-04-24 LAB — T4, FREE: Free T4: 1 ng/dL (ref 0.8–1.8)

## 2017-04-28 ENCOUNTER — Ambulatory Visit (INDEPENDENT_AMBULATORY_CARE_PROVIDER_SITE_OTHER): Payer: Medicare Other | Admitting: Family Medicine

## 2017-04-28 ENCOUNTER — Encounter: Payer: Self-pay | Admitting: Family Medicine

## 2017-04-28 VITALS — BP 120/82 | HR 77 | Resp 16 | Ht 66.0 in | Wt 206.0 lb

## 2017-04-28 DIAGNOSIS — F411 Generalized anxiety disorder: Secondary | ICD-10-CM

## 2017-04-28 DIAGNOSIS — I10 Essential (primary) hypertension: Secondary | ICD-10-CM | POA: Diagnosis not present

## 2017-04-28 DIAGNOSIS — E785 Hyperlipidemia, unspecified: Secondary | ICD-10-CM

## 2017-04-28 DIAGNOSIS — E042 Nontoxic multinodular goiter: Secondary | ICD-10-CM

## 2017-04-28 DIAGNOSIS — R7303 Prediabetes: Secondary | ICD-10-CM | POA: Diagnosis not present

## 2017-04-28 DIAGNOSIS — F3289 Other specified depressive episodes: Secondary | ICD-10-CM | POA: Diagnosis not present

## 2017-04-28 DIAGNOSIS — J45991 Cough variant asthma: Secondary | ICD-10-CM | POA: Diagnosis not present

## 2017-04-28 DIAGNOSIS — M25551 Pain in right hip: Secondary | ICD-10-CM | POA: Diagnosis not present

## 2017-04-28 DIAGNOSIS — L308 Other specified dermatitis: Secondary | ICD-10-CM | POA: Diagnosis not present

## 2017-04-28 DIAGNOSIS — E669 Obesity, unspecified: Secondary | ICD-10-CM | POA: Diagnosis not present

## 2017-04-28 DIAGNOSIS — R7989 Other specified abnormal findings of blood chemistry: Secondary | ICD-10-CM

## 2017-04-28 DIAGNOSIS — R946 Abnormal results of thyroid function studies: Secondary | ICD-10-CM | POA: Diagnosis not present

## 2017-04-28 MED ORDER — PREDNISONE 5 MG PO TABS: 5.0000 mg | ORAL_TABLET | Freq: Two times a day (BID) | ORAL | 0 refills | Status: AC

## 2017-04-28 MED ORDER — CETIRIZINE HCL 10 MG PO TABS: 10.0000 mg | ORAL_TABLET | Freq: Every day | ORAL | 1 refills | Status: DC

## 2017-04-28 MED ORDER — BETAMETHASONE DIPROPIONATE 0.05 % EX CREA: TOPICAL_CREAM | Freq: Two times a day (BID) | CUTANEOUS | 0 refills | Status: DC

## 2017-04-28 MED ORDER — MONTELUKAST SODIUM 10 MG PO TABS: ORAL_TABLET | ORAL | 1 refills | Status: DC

## 2017-04-28 MED ORDER — KETOROLAC TROMETHAMINE 60 MG/2ML IM SOLN
60.0000 mg | Freq: Once | INTRAMUSCULAR | Status: AC
  Administered 2017-04-28: 60 mg via INTRAMUSCULAR

## 2017-04-28 MED ORDER — METHYLPREDNISOLONE ACETATE 80 MG/ML IJ SUSP
80.0000 mg | Freq: Once | INTRAMUSCULAR | Status: AC
  Administered 2017-04-28: 80 mg via INTRAMUSCULAR

## 2017-04-28 NOTE — Patient Instructions (Addendum)
F/u witrh rectal in October, call if you need me before   You are referred for ultrasound of your thyroid gland and also to see Dr Dorris Fetch about your gland  Injections today in the office for your right hip pain  Enjoy camp and be careful not to fall   Fall Prevention in the Home Falls can cause injuries. They can happen to people of all ages. There are many things you can do to make your home safe and to help prevent falls. What can I do on the outside of my home?  Regularly fix the edges of walkways and driveways and fix any cracks.  Remove anything that might make you trip as you walk through a door, such as a raised step or threshold.  Trim any bushes or trees on the path to your home.  Use bright outdoor lighting.  Clear any walking paths of anything that might make someone trip, such as rocks or tools.  Regularly check to see if handrails are loose or broken. Make sure that both sides of any steps have handrails.  Any raised decks and porches should have guardrails on the edges.  Have any leaves, snow, or ice cleared regularly.  Use sand or salt on walking paths during winter.  Clean up any spills in your garage right away. This includes oil or grease spills. What can I do in the bathroom?  Use night lights.  Install grab bars by the toilet and in the tub and shower. Do not use towel bars as grab bars.  Use non-skid mats or decals in the tub or shower.  If you need to sit down in the shower, use a plastic, non-slip stool.  Keep the floor dry. Clean up any water that spills on the floor as soon as it happens.  Remove soap buildup in the tub or shower regularly.  Attach bath mats securely with double-sided non-slip rug tape.  Do not have throw rugs and other things on the floor that can make you trip. What can I do in the bedroom?  Use night lights.  Make sure that you have a light by your bed that is easy to reach.  Do not use any sheets or blankets that are  too big for your bed. They should not hang down onto the floor.  Have a firm chair that has side arms. You can use this for support while you get dressed.  Do not have throw rugs and other things on the floor that can make you trip. What can I do in the kitchen?  Clean up any spills right away.  Avoid walking on wet floors.  Keep items that you use a lot in easy-to-reach places.  If you need to reach something above you, use a strong step stool that has a grab bar.  Keep electrical cords out of the way.  Do not use floor polish or wax that makes floors slippery. If you must use wax, use non-skid floor wax.  Do not have throw rugs and other things on the floor that can make you trip. What can I do with my stairs?  Do not leave any items on the stairs.  Make sure that there are handrails on both sides of the stairs and use them. Fix handrails that are broken or loose. Make sure that handrails are as long as the stairways.  Check any carpeting to make sure that it is firmly attached to the stairs. Fix any carpet that is loose  or worn.  Avoid having throw rugs at the top or bottom of the stairs. If you do have throw rugs, attach them to the floor with carpet tape.  Make sure that you have a light switch at the top of the stairs and the bottom of the stairs. If you do not have them, ask someone to add them for you. What else can I do to help prevent falls?  Wear shoes that:  Do not have high heels.  Have rubber bottoms.  Are comfortable and fit you well.  Are closed at the toe. Do not wear sandals.  If you use a stepladder:  Make sure that it is fully opened. Do not climb a closed stepladder.  Make sure that both sides of the stepladder are locked into place.  Ask someone to hold it for you, if possible.  Clearly mark and make sure that you can see:  Any grab bars or handrails.  First and last steps.  Where the edge of each step is.  Use tools that help you move  around (mobility aids) if they are needed. These include:  Canes.  Walkers.  Scooters.  Crutches.  Turn on the lights when you go into a dark area. Replace any light bulbs as soon as they burn out.  Set up your furniture so you have a clear path. Avoid moving your furniture around.  If any of your floors are uneven, fix them.  If there are any pets around you, be aware of where they are.  Review your medicines with your doctor. Some medicines can make you feel dizzy. This can increase your chance of falling. Ask your doctor what other things that you can do to help prevent falls. This information is not intended to replace advice given to you by your health care provider. Make sure you discuss any questions you have with your health care provider. Document Released: 09/21/2009 Document Revised: 05/02/2016 Document Reviewed: 12/30/2014 Elsevier Interactive Patient Education  2017 [employer].

## 2017-04-28 NOTE — Assessment & Plan Note (Signed)
Uncontrolled.Toradol and depo medrol administered IM in the office , to be followed by a short course of oral prednisone   

## 2017-05-02 ENCOUNTER — Ambulatory Visit (HOSPITAL_COMMUNITY)
Admission: RE | Admit: 2017-05-02 | Discharge: 2017-05-02 | Disposition: A | Discharge status: Home / Self Care | Payer: Medicare Other | Source: Ambulatory Visit | Attending: Family Medicine | Admitting: Family Medicine

## 2017-05-02 ENCOUNTER — Other Ambulatory Visit: Payer: Self-pay | Admitting: Family Medicine

## 2017-05-02 DIAGNOSIS — R7989 Other specified abnormal findings of blood chemistry: Secondary | ICD-10-CM

## 2017-05-02 DIAGNOSIS — R946 Abnormal results of thyroid function studies: Secondary | ICD-10-CM | POA: Insufficient documentation

## 2017-05-02 DIAGNOSIS — E041 Nontoxic single thyroid nodule: Secondary | ICD-10-CM | POA: Diagnosis not present

## 2017-05-04 DIAGNOSIS — L309 Dermatitis, unspecified: Secondary | ICD-10-CM | POA: Insufficient documentation

## 2017-05-04 NOTE — Assessment & Plan Note (Signed)
Controlled, no change in medication  

## 2017-05-04 NOTE — Assessment & Plan Note (Signed)
Unchanged Patient re-educated about  the importance of commitment to a  minimum of 150 minutes of exercise per week.  The importance of healthy food choices with portion control discussed. Encouraged to start a food diary, count calories and to consider  joining a support group. Sample diet sheets offered. Goals set by the patient for the next several months.   Weight /BMI 04/28/2017 12/30/2016 08/05/2016  WEIGHT 206 lb 205 lb 0.6 oz 206 lb  HEIGHT 5\' 6"  5\' 6"  5\' 6"   BMI 33.25 kg/m2 33.09 kg/m2 33.25 kg/m2

## 2017-05-04 NOTE — Assessment & Plan Note (Signed)
Controlled, no change in medication DASH diet and commitment to daily physical activity for a minimum of 30 minutes discussed and encouraged, as a part of hypertension management. The importance of attaining a healthy weight is also discussed.  BP/Weight 04/28/2017 12/30/2016 08/05/2016 02/23/2016 12/13/2015 10/24/2015 1/89/8421  Systolic BP 031 281 188 677 373 668 159  Diastolic BP 82 80 82 82 84 82 82  Wt. (Lbs) 206 205.04 206 207 206 - 207.12  BMI 33.25 33.09 33.25 33.43 33.27 - 32.43

## 2017-05-04 NOTE — Assessment & Plan Note (Signed)
Sparing use of steroid cream twice daily for 5 to 7 days , then as needed

## 2017-05-04 NOTE — Progress Notes (Signed)
[name]     MRN: [id number]      DOB: 17-Oct-1951   HPI [name] is here for follow up and re-evaluation of chronic medical conditions, medication management and review of any available recent lab and radiology data.  Preventive health is updated, specifically  Cancer screening and Immunization.    The PT denies any adverse reactions to current medications since the last visit.  There are no new concerns.  Requests injections in office for her chronic right hip pain esp in anticipation of upcoming trip to camp C/o pruritic rash on neck and chest esp on sun exposure  ROS Denies recent fever or chills. Denies sinus pressure, nasal congestion, ear pain or sore throat. Denies chest congestion, productive cough or wheezing. Denies chest pains, palpitations and leg swelling Denies abdominal pain, nausea, vomiting,diarrhea or constipation.   Denies dysuria, frequency, hesitancy or incontinence. . Denies headaches, seizures, numbness, or tingling. Denies depression, anxiety or insomnia.  PE  BP 120/82   Pulse 77   Resp 16   Ht 5\' 6"  (1.676 m)   Wt 206 lb (93.4 kg)   SpO2 97%   BMI 33.25 kg/m   Patient alert and oriented and in no cardiopulmonary distress.  HEENT: No facial asymmetry, EOMI,   oropharynx pink and moist.  Neck supple no JVD, no mass.  Chest: Clear to auscultation bilaterally.  CVS: S1, S2 no murmurs, no S3.Regular rate.  ABD: Soft non tender.   Ext: No edema  MS: Decreased  ROM spine, and right hips   Skin: Intact, eczema noted.on forearms and anterior chest mild  Psych: normal affect. Memory intact not anxious or depressed appearing.  CNS: CN 2-12 intact, power,  normal throughout.no focal deficits noted.   Assessment & Plan  HIP PAIN, RIGHT Uncontrolled.Toradol and depo medrol administered IM in the office , to be followed by a short course of oral prednisone .   Multiple thyroid nodules repeat US and refer to endo esp as TSH is  abnormal  Hyperlipidemia LDL goal <100 Hyperlipidemia:Low fat diet discussed and encouraged.   Lipid Panel  Lab Results  Component Value Date   CHOL 186 04/21/2017   HDL 83 04/21/2017   LDLCALC 90 04/21/2017   TRIG 66 04/21/2017   CHOLHDL 2.2 04/21/2017   Controlled, no change in medication     Depression Controlled, no change in medication   Asthma, cough variant Controlled, no change in medication   Essential hypertension Controlled, no change in medication DASH diet and commitment to daily physical activity for a minimum of 30 minutes discussed and encouraged, as a part of hypertension management. The importance of attaining a healthy weight is also discussed.  BP/Weight 04/28/2017 12/30/2016 08/05/2016 02/23/2016 12/13/2015 10/24/2015 7/82/9562  Systolic BP 130 865 784 696 295 284 132  Diastolic BP 82 80 82 82 84 82 82  Wt. (Lbs) 206 205.04 206 207 206 - 207.12  BMI 33.25 33.09 33.25 33.43 33.27 - 32.43       Obesity (BMI 30-39.9) Unchanged Patient re-educated about  the importance of commitment to a  minimum of 150 minutes of exercise per week.  The importance of healthy food choices with portion control discussed. Encouraged to start a food diary, count calories and to consider  joining a support group. Sample diet sheets offered. Goals set by the patient for the next several months.   Weight /BMI 04/28/2017 12/30/2016 08/05/2016  WEIGHT 206 lb 205 lb 0.6 oz 206 lb  HEIGHT  5\' 6"  5\' 6"  5\' 6"   BMI 33.25 kg/m2 33.09 kg/m2 33.25 kg/m2      Prediabetes unchanged Patient educated about the importance of limiting  Carbohydrate intake , the need to commit to daily physical activity for a minimum of 30 minutes , and to commit weight loss. The fact that changes in all these areas will reduce or eliminate all together the development of diabetes is stressed.   Diabetic Labs Latest Ref Rng & Units 04/21/2017 08/05/2016 12/13/2015 05/03/2015 03/26/2015  HbA1c <5.7 % 5.7(H)  5.7(H) 5.9(H) 5.8(H) -  Chol <200 mg/dL 186 181 205(H) 197 -  HDL >50 mg/dL 83 65 84 91 -  Calc LDL <100 mg/dL 90 96 96 92 -  Triglycerides <150 mg/dL 66 98 123 71 -  Creatinine 0.50 - 0.99 mg/dL 0.97 0.85 0.76 - 0.82   BP/Weight 04/28/2017 12/30/2016 08/05/2016 02/23/2016 12/13/2015 10/24/2015 06/07/1600  Systolic BP 093 235 573 220 254 270 623  Diastolic BP 82 80 82 82 84 82 82  Wt. (Lbs) 206 205.04 206 207 206 - 207.12  BMI 33.25 33.09 33.25 33.43 33.27 - 32.43   No flowsheet data found.    GAD (generalized anxiety disorder) Controlled, no change in medication   Eczema Sparing use of steroid cream twice daily for 5 to 7 days , then as needed  \

## 2017-05-04 NOTE — Assessment & Plan Note (Signed)
unchanged Patient educated about the importance of limiting  Carbohydrate intake , the need to commit to daily physical activity for a minimum of 30 minutes , and to commit weight loss. The fact that changes in all these areas will reduce or eliminate all together the development of diabetes is stressed.   Diabetic Labs Latest Ref Rng & Units 04/21/2017 08/05/2016 12/13/2015 05/03/2015 03/26/2015  HbA1c <5.7 % 5.7(H) 5.7(H) 5.9(H) 5.8(H) -  Chol <200 mg/dL 186 181 205(H) 197 -  HDL >50 mg/dL 83 65 84 91 -  Calc LDL <100 mg/dL 90 96 96 92 -  Triglycerides <150 mg/dL 66 98 123 71 -  Creatinine 0.50 - 0.99 mg/dL 0.97 0.85 0.76 - 0.82   BP/Weight 04/28/2017 12/30/2016 08/05/2016 02/23/2016 12/13/2015 10/24/2015 0/31/2811  Systolic BP 886 773 736 681 594 707 615  Diastolic BP 82 80 82 82 84 82 82  Wt. (Lbs) 206 205.04 206 207 206 - 207.12  BMI 33.25 33.09 33.25 33.43 33.27 - 32.43   No flowsheet data found.

## 2017-05-04 NOTE — Assessment & Plan Note (Signed)
repeat US and refer to endo esp as TSH is abnormal

## 2017-05-04 NOTE — Assessment & Plan Note (Signed)
Hyperlipidemia:Low fat diet discussed and encouraged.   Lipid Panel  Lab Results  Component Value Date   CHOL 186 04/21/2017   HDL 83 04/21/2017   LDLCALC 90 04/21/2017   TRIG 66 04/21/2017   CHOLHDL 2.2 04/21/2017   Controlled, no change in medication

## 2017-05-06 DIAGNOSIS — H2703 Aphakia, bilateral: Secondary | ICD-10-CM | POA: Diagnosis not present

## 2017-05-06 DIAGNOSIS — H179 Unspecified corneal scar and opacity: Secondary | ICD-10-CM | POA: Diagnosis not present

## 2017-05-21 ENCOUNTER — Ambulatory Visit: Payer: Medicare Other | Admitting: "Endocrinology

## 2017-05-28 ENCOUNTER — Other Ambulatory Visit: Payer: Self-pay | Admitting: Family Medicine

## 2017-06-03 ENCOUNTER — Other Ambulatory Visit: Payer: Self-pay | Admitting: Family Medicine

## 2017-06-03 DIAGNOSIS — Z1231 Encounter for screening mammogram for malignant neoplasm of breast: Secondary | ICD-10-CM

## 2017-06-23 ENCOUNTER — Other Ambulatory Visit: Payer: Self-pay | Admitting: Family Medicine

## 2017-06-24 ENCOUNTER — Encounter: Payer: Self-pay | Admitting: "Endocrinology

## 2017-06-24 ENCOUNTER — Ambulatory Visit (INDEPENDENT_AMBULATORY_CARE_PROVIDER_SITE_OTHER): Payer: Medicare Other | Admitting: "Endocrinology

## 2017-06-24 VITALS — BP 130/82 | HR 73 | Ht 66.0 in | Wt 197.0 lb

## 2017-06-24 DIAGNOSIS — E039 Hypothyroidism, unspecified: Secondary | ICD-10-CM | POA: Insufficient documentation

## 2017-06-24 DIAGNOSIS — E049 Nontoxic goiter, unspecified: Secondary | ICD-10-CM

## 2017-06-24 MED ORDER — LEVOTHYROXINE SODIUM 50 MCG PO TABS: 50.0000 ug | ORAL_TABLET | Freq: Every day | ORAL | 3 refills | Status: DC

## 2017-06-24 NOTE — Progress Notes (Signed)
Subjective:    Patient ID: [name], female    DOB: 05-Nov-1951, PCP Fayrene Helper, MD   Past Medical History:  Diagnosis Date  . Allergy   . Anxiety   . Arthritis   . Asthma   . Hyperlipidemia   . Hypertension   . Vision abnormalities    states close to blindness due to cataracts   Past Surgical History:  Procedure Laterality Date  . caesarean    . cataracts    . CESAREAN SECTION    . TOTAL ABDOMINAL HYSTERECTOMY     prolapse   Social History   Social History  . Marital status: Single    Spouse name: N/A  . Number of children: N/A  . Years of education: 12th grade   Occupational History  . disabled Not Employed   Social History Main Topics  . Smoking status: Former Smoker    Packs/day: 0.50    Years: 18.00    Quit date: 1989  . Smokeless tobacco: Never Used  . Alcohol use No  . Drug use: No  . Sexual activity: Not Currently   Other Topics Concern  . None   Social History Narrative  . None   Outpatient Encounter Prescriptions as of 06/24/2017  Medication Sig  . acetaminophen (TYLENOL) 650 MG CR tablet Take 650 mg by mouth as needed for pain.  Marland Kitchen albuterol (PROAIR HFA) 108 (90 BASE) MCG/ACT inhaler Two puffs every 4 hours as needed for cough or wheeze.  Marland Kitchen amLODipine (NORVASC) 10 MG tablet TAKE 1 TABLET BY MOUTH AT BEDTIME.  . betamethasone dipropionate (DIPROLENE) 0.05 % cream Apply topically 2 (two) times daily. Apply sparingly twice daily to arms , back and chest, do NOT apply to face, only when itching excessively  . budesonide-formoterol (SYMBICORT) 80-4.5 MCG/ACT inhaler Inhale 2 puffs into the lungs 2 (two) times daily.  . Calcium Carb-Cholecalciferol (CALCIUM 500 +D) 500-400 MG-UNIT TABS Take tab twice daily (Patient taking differently: Take 1 tablet by mouth 2 (two) times daily. Take tab twice daily)  . cetirizine (ZYRTEC) 10 MG tablet Take 1 tablet (10 mg total) by mouth daily.  . fluticasone (FLONASE) 50 MCG/ACT nasal spray USE 1  SPRAY IN EACH NOSTRIL DAILY.  Marland Kitchen ibuprofen (ADVIL,MOTRIN) 200 MG tablet Take 200 mg by mouth every 6 (six) hours as needed for mild pain or moderate pain.  Marland Kitchen levothyroxine (SYNTHROID, LEVOTHROID) 50 MCG tablet Take 1 tablet (50 mcg total) by mouth daily.  Marland Kitchen lovastatin (MEVACOR) 40 MG tablet TAKE (1) TABLET BY MOUTH AT BEDTIME.  . montelukast (SINGULAIR) 10 MG tablet TAKE (1) TABLET BY MOUTH AT BEDTIME.  . nabumetone (RELAFEN) 750 MG tablet TAKE ONE TABLET BY MOUTH DAILY.  . pantoprazole (PROTONIX) 20 MG tablet TAKE ONE TABLET BY MOUTH DAILY.  Marland Kitchen PARoxetine (PAXIL) 20 MG tablet TAKE ONE TABLET BY MOUTH DAILY.  Marland Kitchen SALINE NASAL MIST NA Place 1-2 sprays into the nose daily as needed (for congestion).  . triamterene-hydrochlorothiazide (MAXZIDE-25) 37.5-25 MG tablet TAKE 1 TABLET BY MOUTH ONCE A DAY.   No facility-administered encounter medications on file as of 06/24/2017.    ALLERGIES: Allergies  Allergen Reactions  . Lotensin Hct [Benazepril-Hydrochlorothiazide] Hives    VACCINATION STATUS: Immunization History  Administered Date(s) Administered  . H1N1 11/15/2008  . Influenza Split 08/30/2011, 08/14/2012  . Influenza Whole 09/15/2009, 09/26/2010  . Influenza,inj,Quad PF,36+ Mos 10/18/2013, 08/25/2014, 12/13/2015, 08/05/2016  . Pneumococcal Conjugate-13 05/03/2015  . Pneumococcal Polysaccharide-23 08/05/2016  . Td 03/02/2004  .  Tdap 12/12/2011    HPI 66 year old female patient with medical history as above. She is being seen in consultation for hypothyroidism requested by Dr. Tula Nakayama. - She was noticed to have significantly elevated TSH for at least the last  4 years, most recent TSH was elevated at 8.74 associated with low normal free T4 of 1.0. - She is a poor historian accompanied by her sister. She denies any prior history of thyroid dysfunction. She was never treated for hypothyroidism nor for hyperthyroidism. - She has steady weight for the last several years. She  complains of constipation, cold intolerance. - She denies dysphagia, shortness of breath, nor voice change. She had  a series of thyroid ultrasound studies, last one from 05/02/2017 showing 1 cm solitary nodule on the left lobe of the thyroid . - Ultrasound description of the thyroid summarized below. 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  Current Outpatient Prescriptions:  .  acetaminophen (TYLENOL) 650 MG CR tablet, Take 650 mg by mouth as needed for pain., Disp: , Rfl:  .  albuterol (PROAIR HFA) 108 (90 BASE) MCG/ACT inhaler, Two puffs every 4 hours as needed for cough or wheeze., Disp: 1 Inhaler, Rfl: 2 .  amLODipine (NORVASC) 10 MG tablet, TAKE 1 TABLET BY MOUTH AT BEDTIME., Disp: 90 tablet, Rfl: 0 .  betamethasone dipropionate (DIPROLENE) 0.05 % cream, Apply topically 2 (two) times daily. Apply sparingly twice daily to arms , back and chest, do NOT apply to face, only when itching excessively, Disp: 30 g, Rfl: 0 .  budesonide-formoterol (SYMBICORT) 80-4.5 MCG/ACT inhaler, Inhale 2 puffs into the lungs 2 (two) times daily., Disp: 1 Inhaler, Rfl: 12 .  Calcium Carb-Cholecalciferol (CALCIUM 500 +D) 500-400 MG-UNIT TABS, Take tab twice daily (Patient taking differently: Take 1 tablet by mouth 2 (two) times daily. Take tab twice daily), Disp: 60 tablet, Rfl: 11 .  cetirizine (ZYRTEC) 10 MG tablet, Take 1 tablet (10 mg total) by mouth daily., Disp: 90 tablet, Rfl: 1 .  fluticasone (FLONASE) 50 MCG/ACT nasal spray, USE 1 SPRAY IN EACH NOSTRIL DAILY., Disp: 48 g, Rfl: 1 .  ibuprofen (ADVIL,MOTRIN) 200 MG tablet, Take 200 mg by mouth every 6 (six) hours as needed for mild pain or moderate pain., Disp: , Rfl:  .  levothyroxine (SYNTHROID, LEVOTHROID) 50 MCG tablet, Take 1 tablet (50 mcg total) by mouth daily., Disp: 30 tablet, Rfl: 3 .  lovastatin (MEVACOR) 40 MG tablet, TAKE (1) TABLET BY MOUTH AT BEDTIME., Disp: 90 tablet, Rfl: 0 .   montelukast (SINGULAIR) 10 MG tablet, TAKE (1) TABLET BY MOUTH AT BEDTIME., Disp: 90 tablet, Rfl: 1 .  nabumetone (RELAFEN) 750 MG tablet, TAKE ONE TABLET BY MOUTH DAILY., Disp: 90 tablet, Rfl: 0 .  pantoprazole (PROTONIX) 20 MG tablet, TAKE ONE TABLET BY MOUTH DAILY., Disp: 30 tablet, Rfl: 3 .  PARoxetine (PAXIL) 20 MG tablet, TAKE ONE TABLET BY MOUTH DAILY., Disp: 90 tablet, Rfl: 0 .  SALINE NASAL MIST NA, Place 1-2 sprays into the nose daily as needed (for congestion)., Disp: , Rfl:  .  triamterene-hydrochlorothiazide (MAXZIDE-25) 37.5-25 MG tablet, TAKE 1 TABLET BY MOUTH ONCE A DAY., Disp: 30 tablet, Rfl: 0  Past Medical History:  Diagnosis Date  . Allergy   . Anxiety   . Arthritis   . Asthma   . Hyperlipidemia   . Hypertension   . Vision abnormalities    states close to blindness due to cataracts  Past Surgical History:  Procedure Laterality Date  . caesarean    . cataracts    . CESAREAN SECTION    . TOTAL ABDOMINAL HYSTERECTOMY     prolapse   Review of Systems  Constitutional:  Lost 8 pounds away this January 2018 , + fatigue, no subjective hyperthermia, no subjective hypothermia Eyes: no blurry vision, no xerophthalmia ENT: + Severe visual impairment due to corneal defect, no sore throat, no nodules palpated in throat, no dysphagia/odynophagia, no hoarseness Cardiovascular: no Chest Pain, no Shortness of Breath, no palpitations, no leg swelling Respiratory: no cough, no SOB Gastrointestinal: no Nausea/Vomiting/Diarhhea Musculoskeletal: no muscle/joint aches Skin: no rashes Neurological: no tremors, no numbness, no tingling, no dizziness Psychiatric: no depression, no anxiety  Objective:    BP 130/82   Pulse 73   Ht 5\' 6"  (1.676 m)   Wt 197 lb (89.4 kg)   BMI 31.80 kg/m   Wt Readings from Last 3 Encounters:  06/24/17 197 lb (89.4 kg)  04/28/17 206 lb (93.4 kg)  12/30/16 205 lb 0.6 oz (93 kg)    Physical Exam  Constitutional:  + Significantly over weight  for height, not in acute distress, normal state of mind, uses her walking cane due to visual impairment. Eyes: PERRLA, EOMI, no exophthalmos ENT: moist mucous membranes, no thyromegaly, no cervical lymphadenopathy Cardiovascular: normal precordial activity, Regular Rate and Rhythm, no Murmur/Rubs/Gallops Respiratory:  adequate breathing efforts, no gross chest deformity, Clear to auscultation bilaterally Gastrointestinal: abdomen soft, Non -tender, No distension, Bowel Sounds present Musculoskeletal: no gross deformities, strength intact in all four extremities Skin: moist, warm, no rashes Neurological: no tremor with outstretched hands, Deep tendon reflexes normal in all four extremities.  CMP ( most recent) CMP     Component Value Date/Time   NA 142 04/21/2017 0846   K 3.4 (L) 04/21/2017 0846   CL 105 04/21/2017 0846   CO2 25 04/21/2017 0846   GLUCOSE 121 (H) 04/21/2017 0846   BUN 23 04/21/2017 0846   CREATININE 0.97 04/21/2017 0846   CALCIUM 9.3 04/21/2017 0846   PROT 7.3 04/21/2017 0846   ALBUMIN 3.9 04/21/2017 0846   AST 23 04/21/2017 0846   ALT 23 04/21/2017 0846   ALKPHOS 49 04/21/2017 0846   BILITOT 0.3 04/21/2017 0846   GFRNONAA 61 04/21/2017 0846   GFRAA 70 04/21/2017 0846     Diabetic Labs (most recent): Lab Results  Component Value Date   HGBA1C 5.7 (H) 04/21/2017   HGBA1C 5.7 (H) 08/05/2016   HGBA1C 5.9 (H) 12/13/2015     Lipid Panel ( most recent) Lipid Panel     Component Value Date/Time   CHOL 186 04/21/2017 0846   TRIG 66 04/21/2017 0846   HDL 83 04/21/2017 0846   CHOLHDL 2.2 04/21/2017 0846   VLDL 13 04/21/2017 0846   LDLCALC 90 04/21/2017 0846     Results for TREMEKA, HELBLING (MRN [id number]) as of 06/24/2017 13:52  Ref. Range 08/05/2016 12:01 04/21/2017 08:46  TSH Latest Units: mIU/L 6.86 (H) 8.74 (H)  Triiodothyronine,Free,Serum Latest Ref Range: 2.3 - 4.2 pg/mL 2.7 2.6  T4,Free(Direct) Latest Ref Range: 0.8 - 1.8 ng/dL 0.9 1.0    Last  ultrasound of the thyroid from 05/02/2017 showed right lobe 4.2 cm previously 4.2 cm, left lobe 3.2 cm previously 4.3 cm. There is a 1 cm nodule on the lower part of the left lobe of the thyroid. This nodule was reported to be stable from prior studies.     Assessment &  Plan:   1. Hypothyroidism  - Given the significantly elevated TSH over at least 4 years of time associated with low normal free T4, she will benefit from early initiation of thyroid hormone replacement. I discussed and initiated levothyroxine 50 g by mouth every morning.  - We discussed about correct intake of levothyroxine, at fasting, with water, separated by at least 30 minutes from breakfast, and separated by more than 4 hours from calcium, iron, multivitamins, acid reflux medications (PPIs). -Patient is made aware of the fact that thyroid hormone replacement is needed for life, dose to be adjusted by periodic monitoring of thyroid function tests.  2. Nodular goiter-  - In the EMR records, she has a series of thyroid/neck sonograms, last on in May 2018,  showing stable solitary left lobe nodule at 1 cm in maximum dimension with no suspicious features. She will not require intervention at this time. - She may need repeat physical exam and ultrasound if necessary in 2 years.   - 45 minutes of time was spent on the care of this patient.  - I advised patient to maintain close follow up with Fayrene Helper, MD for primary care needs. Follow up plan: Return in about 3 months (around 09/24/2017) for follow up with pre-visit labs.  Glade Lloyd, MD Phone: [phone]  Fax: [phone]   06/24/2017, 2:18 PM

## 2017-06-26 ENCOUNTER — Ambulatory Visit (HOSPITAL_COMMUNITY)
Admission: RE | Admit: 2017-06-26 | Discharge: 2017-06-26 | Disposition: A | Discharge status: Home / Self Care | Payer: Medicare Other | Source: Ambulatory Visit | Attending: Family Medicine | Admitting: Family Medicine

## 2017-06-26 DIAGNOSIS — Z1231 Encounter for screening mammogram for malignant neoplasm of breast: Secondary | ICD-10-CM | POA: Diagnosis not present

## 2017-07-11 ENCOUNTER — Other Ambulatory Visit: Payer: Self-pay | Admitting: Family Medicine

## 2017-07-14 DIAGNOSIS — H2703 Aphakia, bilateral: Secondary | ICD-10-CM | POA: Diagnosis not present

## 2017-07-14 DIAGNOSIS — Z888 Allergy status to other drugs, medicaments and biological substances status: Secondary | ICD-10-CM | POA: Diagnosis not present

## 2017-07-14 DIAGNOSIS — J45909 Unspecified asthma, uncomplicated: Secondary | ICD-10-CM | POA: Diagnosis not present

## 2017-07-14 DIAGNOSIS — Z7951 Long term (current) use of inhaled steroids: Secondary | ICD-10-CM | POA: Diagnosis not present

## 2017-07-14 DIAGNOSIS — H179 Unspecified corneal scar and opacity: Secondary | ICD-10-CM | POA: Diagnosis not present

## 2017-07-14 DIAGNOSIS — I10 Essential (primary) hypertension: Secondary | ICD-10-CM | POA: Diagnosis not present

## 2017-07-14 DIAGNOSIS — H1789 Other corneal scars and opacities: Secondary | ICD-10-CM | POA: Diagnosis not present

## 2017-07-14 DIAGNOSIS — Z87891 Personal history of nicotine dependence: Secondary | ICD-10-CM | POA: Diagnosis not present

## 2017-07-14 DIAGNOSIS — Z79899 Other long term (current) drug therapy: Secondary | ICD-10-CM | POA: Diagnosis not present

## 2017-07-24 ENCOUNTER — Other Ambulatory Visit: Payer: Self-pay | Admitting: Family Medicine

## 2017-08-29 ENCOUNTER — Other Ambulatory Visit: Payer: Self-pay | Admitting: Family Medicine

## 2017-09-16 DIAGNOSIS — Z800 Family history of malignant neoplasm of digestive organs: Secondary | ICD-10-CM | POA: Diagnosis not present

## 2017-09-16 DIAGNOSIS — Z8041 Family history of malignant neoplasm of ovary: Secondary | ICD-10-CM | POA: Diagnosis not present

## 2017-09-16 DIAGNOSIS — Z1379 Encounter for other screening for genetic and chromosomal anomalies: Secondary | ICD-10-CM | POA: Diagnosis not present

## 2017-09-22 ENCOUNTER — Ambulatory Visit (INDEPENDENT_AMBULATORY_CARE_PROVIDER_SITE_OTHER): Payer: Medicare Other | Admitting: Family Medicine

## 2017-09-22 ENCOUNTER — Encounter: Payer: Self-pay | Admitting: Family Medicine

## 2017-09-22 VITALS — BP 110/82 | HR 83 | Temp 98.3°F | Resp 16 | Ht 66.0 in | Wt 199.2 lb

## 2017-09-22 DIAGNOSIS — E785 Hyperlipidemia, unspecified: Secondary | ICD-10-CM

## 2017-09-22 DIAGNOSIS — Z23 Encounter for immunization: Secondary | ICD-10-CM | POA: Diagnosis not present

## 2017-09-22 DIAGNOSIS — E039 Hypothyroidism, unspecified: Secondary | ICD-10-CM | POA: Diagnosis not present

## 2017-09-22 DIAGNOSIS — E038 Other specified hypothyroidism: Secondary | ICD-10-CM

## 2017-09-22 DIAGNOSIS — F341 Dysthymic disorder: Secondary | ICD-10-CM

## 2017-09-22 DIAGNOSIS — E669 Obesity, unspecified: Secondary | ICD-10-CM

## 2017-09-22 DIAGNOSIS — Z1211 Encounter for screening for malignant neoplasm of colon: Secondary | ICD-10-CM | POA: Diagnosis not present

## 2017-09-22 DIAGNOSIS — R7303 Prediabetes: Secondary | ICD-10-CM

## 2017-09-22 DIAGNOSIS — I10 Essential (primary) hypertension: Secondary | ICD-10-CM | POA: Diagnosis not present

## 2017-09-22 LAB — COMPLETE METABOLIC PANEL WITHOUT GFR
AG Ratio: 1.3 (calc) (ref 1.0–2.5)
ALT: 22 U/L (ref 6–29)
AST: 22 U/L (ref 10–35)
Albumin: 4.2 g/dL (ref 3.6–5.1)
Alkaline phosphatase (APISO): 48 U/L (ref 33–130)
BUN: 23 mg/dL (ref 7–25)
CO2: 31 mmol/L (ref 20–32)
Calcium: 9.6 mg/dL (ref 8.6–10.4)
Chloride: 104 mmol/L (ref 98–110)
Creat: 0.9 mg/dL (ref 0.50–0.99)
GFR, Est African American: 77 mL/min/1.73m2
GFR, Est Non African American: 66 mL/min/1.73m2
Globulin: 3.3 g/dL (ref 1.9–3.7)
Glucose, Bld: 96 mg/dL (ref 65–99)
Potassium: 3.5 mmol/L (ref 3.5–5.3)
Sodium: 143 mmol/L (ref 135–146)
Total Bilirubin: 0.4 mg/dL (ref 0.2–1.2)
Total Protein: 7.5 g/dL (ref 6.1–8.1)

## 2017-09-22 LAB — HEMOCCULT GUIAC POC 1CARD (OFFICE): Fecal Occult Blood, POC: NEGATIVE

## 2017-09-22 LAB — LIPID PANEL
Cholesterol: 195 mg/dL
HDL: 84 mg/dL
LDL Cholesterol (Calc): 94 mg/dL
Non-HDL Cholesterol (Calc): 111 mg/dL
Total CHOL/HDL Ratio: 2.3 (calc)
Triglycerides: 82 mg/dL

## 2017-09-22 MED ORDER — PAROXETINE HCL 20 MG PO TABS
20.0000 mg | ORAL_TABLET | Freq: Every day | ORAL | 3 refills | Status: DC
Start: 2017-09-22 — End: 2018-08-17

## 2017-09-22 NOTE — Assessment & Plan Note (Signed)
Deteriorated. Patient re-educated about  the importance of commitment to a  minimum of 150 minutes of exercise per week.  The importance of healthy food choices with portion control discussed. Encouraged to start a food diary, count calories and to consider  joining a support group. Sample diet sheets offered. Goals set by the patient for the next several months.   Weight /BMI 09/22/2017 06/24/2017 04/28/2017  WEIGHT 199 lb 4 oz 197 lb 206 lb  HEIGHT 5\' 6"  5\' 6"  5\' 6"   BMI 32.16 kg/m2 31.8 kg/m2 33.25 kg/m2

## 2017-09-22 NOTE — Assessment & Plan Note (Signed)
Managed by endo and currently controlled

## 2017-09-22 NOTE — Assessment & Plan Note (Signed)
Controlled, no change in medication  

## 2017-09-22 NOTE — Assessment & Plan Note (Signed)
Controlled, no change in medication DASH diet and commitment to daily physical activity for a minimum of 30 minutes discussed and encouraged, as a part of hypertension management. The importance of attaining a healthy weight is also discussed.  BP/Weight 09/22/2017 06/24/2017 04/28/2017 12/30/2016 08/05/2016 0/30/1314 02/13/8874  Systolic BP 797 282 060 156 153 794 327  Diastolic BP 82 82 82 80 82 82 84  Wt. (Lbs) 199.25 197 206 205.04 206 207 206  BMI 32.16 31.8 33.25 33.09 33.25 33.43 33.27

## 2017-09-22 NOTE — Progress Notes (Signed)
[name]     MRN: [id number]      DOB: [birth year]   HPI [name] is here for follow up and re-evaluation of chronic medical conditions, medication management and review of any available recent lab and radiology data.  Preventive health is updated, specifically  Cancer screening and Immunization.   Questions or concerns regarding consultations or procedures which the PT has had in the interim are  addressed. The PT denies any adverse reactions to current medications since the last visit.  There are no new concerns.  There are no specific complaints   ROS Denies recent fever or chills. Denies sinus pressure, nasal congestion, ear pain or sore throat.c/o tightness in her throt as though she cannot breathe is worried as though her thrid gland is causing this problem and she is choking Denies chest congestion, productive cough or wheezing. Denies chest pains, palpitations and leg swelling Denies abdominal pain, nausea, vomiting,diarrhea or constipation. Denies any change in bowel movements Denies dysuria, frequency, hesitancy or incontinence. C/o chronic neck and low back and hip pain and limitation in mobility. Denies headaches, seizures, numbness, or tingling. Denies depression, anxiety or insomnia. Denies skin break down or rash.   PE  BP 110/82 (BP Location: Left Arm, Patient Position: Sitting, Cuff Size: Normal)   Pulse 83   Temp 98.3 F (36.8 C) (Other (Comment))   Resp 16   Ht 5\' 6"  (1.676 m)   Wt 199 lb 4 oz (90.4 kg)   SpO2 99%   BMI 32.16 kg/m   Patient alert and oriented and in no cardiopulmonary distress.  HEENT: No facial asymmetry, EOMI,   oropharynx pink and moist.  Neck supple no JVD, no mass.  Chest: Clear to auscultation bilaterally.  CVS: S1, S2 no murmurs, no S3.Regular rate.  ABD: Soft non tender.  Rectal: no mass, stool is soft and brown and heme  negative Ext: No edema  MS: Decreased  ROM spine, shoulders, hips and knees.  Skin:  Intact, no ulcerations or rash noted.  Psych: Good eye contact, normal affect. Memory intact not anxious or depressed appearing.  CNS: CN 2-12 intact, power,  normal throughout.no focal deficits noted.   Assessment & Plan  Essential hypertension Controlled, no change in medication DASH diet and commitment to daily physical activity for a minimum of 30 minutes discussed and encouraged, as a part of hypertension management. The importance of attaining a healthy weight is also discussed.  BP/Weight 09/22/2017 06/24/2017 04/28/2017 12/30/2016 08/05/2016 5/63/8756 03/11/3294  Systolic BP 188 416 606 301 601 093 235  Diastolic BP 82 82 82 80 82 82 84  Wt. (Lbs) 199.25 197 206 205.04 206 207 206  BMI 32.16 31.8 33.25 33.09 33.25 33.43 33.27       Depression Controlled, no change in medication   Hypothyroidism Managed by endo and currently controlled  Obesity (BMI 30-39.9) Deteriorated. Patient re-educated about  the importance of commitment to a  minimum of 150 minutes of exercise per week.  The importance of healthy food choices with portion control discussed. Encouraged to start a food diary, count calories and to consider  joining a support group. Sample diet sheets offered. Goals set by the patient for the next several months.   Weight /BMI 09/22/2017 06/24/2017 04/28/2017  WEIGHT 199 lb 4 oz 197 lb 206 lb  HEIGHT 5\' 6"  5\' 6"  5\' 6"   BMI 32.16 kg/m2 31.8 kg/m2 33.25 kg/m2      Prediabetes Patient educated about the importance of  limiting  Carbohydrate intake , the need to commit to daily physical activity for a minimum of 30 minutes , and to commit weight loss. The fact that changes in all these areas will reduce or eliminate all together the development of diabetes is stressed.  Updated lab needed .   Diabetic Labs Latest Ref Rng & Units 09/22/2017 04/21/2017 08/05/2016 12/13/2015 05/03/2015  HbA1c <5.7 % - 5.7(H) 5.7(H) 5.9(H) 5.8(H)  Chol <200 mg/dL 195 186 181 205(H) 197    HDL >50 mg/dL 84 83 65 84 91  Calc LDL <100 mg/dL - 90 96 96 92  Triglycerides <150 mg/dL 82 66 98 123 71  Creatinine 0.50 - 0.99 mg/dL 0.90 0.97 0.85 0.76 -   BP/Weight 09/22/2017 06/24/2017 04/28/2017 12/30/2016 08/05/2016 06/09/6377 04/15/8501  Systolic BP 774 128 786 767 209 470 962  Diastolic BP 82 82 82 80 82 82 84  Wt. (Lbs) 199.25 197 206 205.04 206 207 206  BMI 32.16 31.8 33.25 33.09 33.25 33.43 33.27   No flowsheet data found.

## 2017-09-22 NOTE — Assessment & Plan Note (Signed)
Patient educated about the importance of limiting  Carbohydrate intake , the need to commit to daily physical activity for a minimum of 30 minutes , and to commit weight loss. The fact that changes in all these areas will reduce or eliminate all together the development of diabetes is stressed.  Updated lab needed .   Diabetic Labs Latest Ref Rng & Units 09/22/2017 04/21/2017 08/05/2016 12/13/2015 05/03/2015  HbA1c <5.7 % - 5.7(H) 5.7(H) 5.9(H) 5.8(H)  Chol <200 mg/dL 195 186 181 205(H) 197  HDL >50 mg/dL 84 83 65 84 91  Calc LDL <100 mg/dL - 90 96 96 92  Triglycerides <150 mg/dL 82 66 98 123 71  Creatinine 0.50 - 0.99 mg/dL 0.90 0.97 0.85 0.76 -   BP/Weight 09/22/2017 06/24/2017 04/28/2017 12/30/2016 08/05/2016 2/76/7011 0/0/3496  Systolic BP 116 435 391 225 834 621 947  Diastolic BP 82 82 82 80 82 82 84  Wt. (Lbs) 199.25 197 206 205.04 206 207 206  BMI 32.16 31.8 33.25 33.09 33.25 33.43 33.27   No flowsheet data found.

## 2017-09-22 NOTE — Patient Instructions (Addendum)
F/u in 5 months, call if you need me sooner  Flu vaccine today.,  Rectal exam today  Fasting lipid, cmp and EGFr today   No changes in medication at this time  Thank you  for choosing [facility]. We consider it a privelige to serve you.  Delivering excellent health care in a caring and  compassionate way is our goal.  Partnering with you,  so that together we can achieve this goal is our strategy.    

## 2017-09-23 ENCOUNTER — Encounter: Payer: Self-pay | Admitting: Family Medicine

## 2017-09-23 LAB — THYROGLOBULIN ANTIBODY: Thyroglobulin Ab: <1 IU/mL (ref <=1)

## 2017-09-23 LAB — T4, FREE: Free T4: 1.3 ng/dL (ref 0.8–1.8)

## 2017-09-23 LAB — TSH: TSH: 3.84 mIU/L (ref 0.40–4.50)

## 2017-09-23 LAB — THYROID PEROXIDASE ANTIBODY: Thyroperoxidase Ab SerPl-aCnc: 869 IU/mL — ABNORMAL HIGH (ref <9)

## 2017-09-26 ENCOUNTER — Encounter: Payer: Self-pay | Admitting: "Endocrinology

## 2017-09-26 ENCOUNTER — Ambulatory Visit (INDEPENDENT_AMBULATORY_CARE_PROVIDER_SITE_OTHER): Payer: Medicare Other | Admitting: "Endocrinology

## 2017-09-26 VITALS — BP 121/83 | HR 78 | Ht 66.0 in | Wt 199.0 lb

## 2017-09-26 DIAGNOSIS — E063 Autoimmune thyroiditis: Secondary | ICD-10-CM | POA: Insufficient documentation

## 2017-09-26 DIAGNOSIS — E039 Hypothyroidism, unspecified: Secondary | ICD-10-CM

## 2017-09-26 DIAGNOSIS — E049 Nontoxic goiter, unspecified: Secondary | ICD-10-CM | POA: Diagnosis not present

## 2017-09-26 MED ORDER — LEVOTHYROXINE SODIUM 75 MCG PO TABS: 75.0000 ug | ORAL_TABLET | Freq: Every day | ORAL | 5 refills | Status: DC

## 2017-09-26 NOTE — Progress Notes (Signed)
Subjective:    Patient ID: [name], female    DOB: 1951/09/04, PCP Fayrene Helper, MD   Past Medical History:  Diagnosis Date  . Allergy   . Anxiety   . Arthritis   . Asthma   . Hyperlipidemia   . Hypertension   . Vision abnormalities    states close to blindness due to cataracts   Past Surgical History:  Procedure Laterality Date  . caesarean    . cataracts    . CESAREAN SECTION    . TOTAL ABDOMINAL HYSTERECTOMY     prolapse   Social History   Social History  . Marital status: Single    Spouse name: N/A  . Number of children: N/A  . Years of education: 12th grade   Occupational History  . disabled Not Employed   Social History Main Topics  . Smoking status: Former Smoker    Packs/day: 0.50    Years: 18.00    Quit date: 1989  . Smokeless tobacco: Never Used  . Alcohol use No  . Drug use: No  . Sexual activity: Not Currently   Other Topics Concern  . Not on file   Social History Narrative  . No narrative on file   Outpatient Encounter Prescriptions as of 09/26/2017  Medication Sig  . acetaminophen (TYLENOL) 650 MG CR tablet Take 650 mg by mouth as needed for pain.  Marland Kitchen amLODipine (NORVASC) 10 MG tablet TAKE 1 TABLET BY MOUTH AT BEDTIME.  . betamethasone dipropionate (DIPROLENE) 0.05 % cream Apply topically 2 (two) times daily. Apply sparingly twice daily to arms , back and chest, do NOT apply to face, only when itching excessively  . budesonide-formoterol (SYMBICORT) 80-4.5 MCG/ACT inhaler Inhale 2 puffs into the lungs 2 (two) times daily.  . Calcium Carb-Cholecalciferol (CALCIUM 500 +D) 500-400 MG-UNIT TABS Take tab twice daily (Patient taking differently: Take 1 tablet by mouth 2 (two) times daily. Take tab twice daily)  . cetirizine (ZYRTEC) 10 MG tablet Take 1 tablet (10 mg total) by mouth daily.  Marland Kitchen ibuprofen (ADVIL,MOTRIN) 200 MG tablet Take 200 mg by mouth every 6 (six) hours as needed for mild pain or moderate pain.  Marland Kitchen levothyroxine  (SYNTHROID, LEVOTHROID) 75 MCG tablet Take 1 tablet (75 mcg total) by mouth daily.  Marland Kitchen lovastatin (MEVACOR) 40 MG tablet TAKE (1) TABLET BY MOUTH AT BEDTIME.  . montelukast (SINGULAIR) 10 MG tablet TAKE (1) TABLET BY MOUTH AT BEDTIME.  . nabumetone (RELAFEN) 750 MG tablet TAKE ONE TABLET BY MOUTH DAILY.  . pantoprazole (PROTONIX) 20 MG tablet TAKE ONE TABLET BY MOUTH DAILY.  Marland Kitchen PARoxetine (PAXIL) 20 MG tablet Take 1 tablet (20 mg total) by mouth daily.  Marland Kitchen SALINE NASAL MIST NA Place 1-2 sprays into the nose daily as needed (for congestion).  . triamterene-hydrochlorothiazide (MAXZIDE-25) 37.5-25 MG tablet TAKE 1 TABLET BY MOUTH ONCE A DAY.  . VENTOLIN HFA 108 (90 Base) MCG/ACT inhaler INHALE 2 PUFFS INTO THE LUNGS EVERY 4 HOURS AS NEEDED FOR COUGHNG ANDWHEEZING.  . [DISCONTINUED] levothyroxine (SYNTHROID, LEVOTHROID) 50 MCG tablet Take 1 tablet (50 mcg total) by mouth daily.   No facility-administered encounter medications on file as of 09/26/2017.    ALLERGIES: Allergies  Allergen Reactions  . Lotensin Hct [Benazepril-Hydrochlorothiazide] Hives    VACCINATION STATUS: Immunization History  Administered Date(s) Administered  . H1N1 11/15/2008  . Influenza Split 08/30/2011, 08/14/2012  . Influenza Whole 09/15/2009, 09/26/2010  . Influenza,inj,Quad PF,6+ Mos 10/18/2013, 08/25/2014, 12/13/2015, 08/05/2016, 09/22/2017  .  Pneumococcal Conjugate-13 05/03/2015  . Pneumococcal Polysaccharide-23 08/05/2016  . Td 03/02/2004  . Tdap 12/12/2011    HPI 66 year old female patient with medical history as above. She is being seen in Follow-up for hypothyroidism. - She was initiated on levothyroxine 50 mg by mouth every morning last visit. She is compliant with his medication taking given orally in the morning on empty stomach with water. She started to feel better. She has no new complaints.  - She is a poor historian accompanied by her sister. She denies any prior history of thyroid dysfunction. -  She has steady weight for the last several years.  - She denies dysphagia, shortness of breath, nor voice change. She had  a series of thyroid ultrasound studies, last one from 05/02/2017 showing 1 cm solitary nodule on the left lobe of the thyroid . - Ultrasound description of the thyroid summarized below. 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  Current Outpatient Prescriptions:  .  acetaminophen (TYLENOL) 650 MG CR tablet, Take 650 mg by mouth as needed for pain., Disp: , Rfl:  .  amLODipine (NORVASC) 10 MG tablet, TAKE 1 TABLET BY MOUTH AT BEDTIME., Disp: 90 tablet, Rfl: 0 .  betamethasone dipropionate (DIPROLENE) 0.05 % cream, Apply topically 2 (two) times daily. Apply sparingly twice daily to arms , back and chest, do NOT apply to face, only when itching excessively, Disp: 30 g, Rfl: 0 .  budesonide-formoterol (SYMBICORT) 80-4.5 MCG/ACT inhaler, Inhale 2 puffs into the lungs 2 (two) times daily., Disp: 1 Inhaler, Rfl: 12 .  Calcium Carb-Cholecalciferol (CALCIUM 500 +D) 500-400 MG-UNIT TABS, Take tab twice daily (Patient taking differently: Take 1 tablet by mouth 2 (two) times daily. Take tab twice daily), Disp: 60 tablet, Rfl: 11 .  cetirizine (ZYRTEC) 10 MG tablet, Take 1 tablet (10 mg total) by mouth daily., Disp: 90 tablet, Rfl: 1 .  ibuprofen (ADVIL,MOTRIN) 200 MG tablet, Take 200 mg by mouth every 6 (six) hours as needed for mild pain or moderate pain., Disp: , Rfl:  .  levothyroxine (SYNTHROID, LEVOTHROID) 75 MCG tablet, Take 1 tablet (75 mcg total) by mouth daily., Disp: 30 tablet, Rfl: 5 .  lovastatin (MEVACOR) 40 MG tablet, TAKE (1) TABLET BY MOUTH AT BEDTIME., Disp: 90 tablet, Rfl: 0 .  montelukast (SINGULAIR) 10 MG tablet, TAKE (1) TABLET BY MOUTH AT BEDTIME., Disp: 90 tablet, Rfl: 1 .  nabumetone (RELAFEN) 750 MG tablet, TAKE ONE TABLET BY MOUTH DAILY., Disp: 90 tablet, Rfl: 0 .  pantoprazole (PROTONIX) 20 MG tablet,  TAKE ONE TABLET BY MOUTH DAILY., Disp: 30 tablet, Rfl: 3 .  PARoxetine (PAXIL) 20 MG tablet, Take 1 tablet (20 mg total) by mouth daily., Disp: 90 tablet, Rfl: 3 .  SALINE NASAL MIST NA, Place 1-2 sprays into the nose daily as needed (for congestion)., Disp: , Rfl:  .  triamterene-hydrochlorothiazide (MAXZIDE-25) 37.5-25 MG tablet, TAKE 1 TABLET BY MOUTH ONCE A DAY., Disp: 30 tablet, Rfl: 0 .  VENTOLIN HFA 108 (90 Base) MCG/ACT inhaler, INHALE 2 PUFFS INTO THE LUNGS EVERY 4 HOURS AS NEEDED FOR COUGHNG ANDWHEEZING., Disp: 18 g, Rfl: 0  Past Medical History:  Diagnosis Date  . Allergy   . Anxiety   . Arthritis   . Asthma   . Hyperlipidemia   . Hypertension   . Vision abnormalities    states close to blindness due to cataracts   Past Surgical History:  Procedure Laterality Date  . caesarean    .  cataracts    . CESAREAN SECTION    . TOTAL ABDOMINAL HYSTERECTOMY     prolapse   Review of Systems  Constitutional:  + steady weight, - fatigue, no subjective hyperthermia, no subjective hypothermia Eyes: no blurry vision, no xerophthalmia ENT: + Severe visual impairment due to corneal defect, no sore throat, no nodules palpated in throat, no dysphagia/odynophagia, no hoarseness Cardiovascular: no Chest Pain, no Shortness of Breath, no palpitations, no leg swelling Respiratory: no cough, no SOB Gastrointestinal: no Nausea/Vomiting/Diarhhea Musculoskeletal: no muscle/joint aches Skin: no rashes Neurological: no tremors, no numbness, no tingling, no dizziness Psychiatric: no depression, no anxiety  Objective:    BP 121/83   Pulse 78   Ht 5\' 6"  (1.676 m)   Wt 199 lb (90.3 kg)   BMI 32.12 kg/m   Wt Readings from Last 3 Encounters:  09/26/17 199 lb (90.3 kg)  09/22/17 199 lb 4 oz (90.4 kg)  06/24/17 197 lb (89.4 kg)    Physical Exam  Constitutional:  + Significantly over weight for height, not in acute distress, normal state of mind, uses her walking cane due to visual  impairment. Eyes: PERRLA, EOMI, no exophthalmos ENT: moist mucous membranes, no thyromegaly, no cervical lymphadenopathy Cardiovascular: normal precordial activity, Regular Rate and Rhythm, no Murmur/Rubs/Gallops Respiratory:  adequate breathing efforts, no gross chest deformity, Clear to auscultation bilaterally Gastrointestinal: abdomen soft, Non -tender, No distension, Bowel Sounds present Musculoskeletal: no gross deformities, strength intact in all four extremities Skin: moist, warm, no rashes Neurological: no tremor with outstretched hands, Deep tendon reflexes normal in all four extremities.  CMP ( most recent) CMP     Component Value Date/Time   NA 143 09/22/2017 1217   K 3.5 09/22/2017 1217   CL 104 09/22/2017 1217   CO2 31 09/22/2017 1217   GLUCOSE 96 09/22/2017 1217   BUN 23 09/22/2017 1217   CREATININE 0.90 09/22/2017 1217   CALCIUM 9.6 09/22/2017 1217   PROT 7.5 09/22/2017 1217   ALBUMIN 3.9 04/21/2017 0846   AST 22 09/22/2017 1217   ALT 22 09/22/2017 1217   ALKPHOS 49 04/21/2017 0846   BILITOT 0.4 09/22/2017 1217   GFRNONAA 66 09/22/2017 1217   GFRAA 77 09/22/2017 1217     Diabetic Labs (most recent): Lab Results  Component Value Date   HGBA1C 5.7 (H) 04/21/2017   HGBA1C 5.7 (H) 08/05/2016   HGBA1C 5.9 (H) 12/13/2015     Lipid Panel ( most recent) Lipid Panel     Component Value Date/Time   CHOL 195 09/22/2017 1217   TRIG 82 09/22/2017 1217   HDL 84 09/22/2017 1217   CHOLHDL 2.3 09/22/2017 1217   VLDL 13 04/21/2017 0846   LDLCALC 90 04/21/2017 0846     Results for LINDSI, BAYLISS (MRN [id number]) as of 09/26/2017 11:27  Ref. Range 08/05/2016 12:01 04/21/2017 08:46 09/22/2017 12:20  TSH Latest Ref Range: 0.40 - 4.50 mIU/L 6.86 (H) 8.74 (H) 3.84  Triiodothyronine,Free,Serum Latest Ref Range: 2.3 - 4.2 pg/mL 2.7 2.6   T4,Free(Direct) Latest Ref Range: 0.8 - 1.8 ng/dL 0.9 1.0 1.3  Thyroglobulin Ab Latest Ref Range: < or = 1 IU/mL   <1   Thyroperoxidase Ab SerPl-aCnc Latest Ref Range: <9 IU/mL   869 (H)    Last ultrasound of the thyroid from 05/02/2017 showed right lobe 4.2 cm previously 4.2 cm, left lobe 3.2 cm previously 4.3 cm. There is a 1 cm nodule on the lower part of the left lobe of the thyroid. This  nodule was reported to be stable from prior studies.     Assessment & Plan:   1. Hypothyroidism  - She has done very well on levothyroxine. Based on her labs, she would benefit from slight increase in her dose.  - I discussed and increase her levothyroxine to 75 g by mouth every morning.   - We discussed about correct intake of levothyroxine, at fasting, with water, separated by at least 30 minutes from breakfast, and separated by more than 4 hours from calcium, iron, multivitamins, acid reflux medications (PPIs). -Patient is made aware of the fact that thyroid hormone replacement is needed for life, dose to be adjusted by periodic monitoring of thyroid function tests.  2. Nodular goiter-  - In the EMR records, she has a series of thyroid/neck sonograms, last on in May 2018,  showing stable solitary left lobe nodule at 1 cm in maximum dimension with no suspicious features. She will not require intervention at this time. - She may need repeat physical exam and ultrasound if necessary in 2 years.   - I advised patient to maintain close follow up with Fayrene Helper, MD for primary care needs. Follow up plan: Return in about 6 months (around 03/27/2018) for follow up with pre-visit labs.  Glade Lloyd, MD Phone: [phone]  Fax: [phone]  -  This note was partially dictated with voice recognition software. Similar sounding words can be transcribed inadequately or may not  be corrected upon review.  09/26/2017, 11:27 AM

## 2017-09-27 ENCOUNTER — Other Ambulatory Visit: Payer: Self-pay | Admitting: Family Medicine

## 2017-10-02 ENCOUNTER — Other Ambulatory Visit: Payer: Self-pay | Admitting: Family Medicine

## 2017-11-01 ENCOUNTER — Other Ambulatory Visit: Payer: Self-pay | Admitting: Family Medicine

## 2017-11-28 ENCOUNTER — Other Ambulatory Visit: Payer: Self-pay | Admitting: Family Medicine

## 2017-12-11 ENCOUNTER — Ambulatory Visit (INDEPENDENT_AMBULATORY_CARE_PROVIDER_SITE_OTHER): Payer: Medicare Other | Admitting: Family Medicine

## 2017-12-11 ENCOUNTER — Encounter: Payer: Self-pay | Admitting: Family Medicine

## 2017-12-11 VITALS — BP 120/70 | HR 90 | Resp 16 | Ht 66.0 in | Wt 202.0 lb

## 2017-12-11 DIAGNOSIS — F341 Dysthymic disorder: Secondary | ICD-10-CM

## 2017-12-11 DIAGNOSIS — M25551 Pain in right hip: Secondary | ICD-10-CM

## 2017-12-11 DIAGNOSIS — R490 Dysphonia: Secondary | ICD-10-CM

## 2017-12-11 DIAGNOSIS — E669 Obesity, unspecified: Secondary | ICD-10-CM

## 2017-12-11 DIAGNOSIS — I10 Essential (primary) hypertension: Secondary | ICD-10-CM

## 2017-12-11 MED ORDER — BENZONATATE 100 MG PO CAPS: 100.0000 mg | ORAL_CAPSULE | Freq: Two times a day (BID) | ORAL | 0 refills | Status: DC | PRN

## 2017-12-11 MED ORDER — IBUPROFEN 600 MG PO TABS: ORAL_TABLET | ORAL | 0 refills | Status: DC

## 2017-12-11 MED ORDER — PREDNISONE 5 MG (21) PO TBPK: 5.0000 mg | ORAL_TABLET | ORAL | 0 refills | Status: DC

## 2017-12-11 NOTE — Patient Instructions (Addendum)
F/u in 4 month, call if you need me before, cancel March appointment please  Wellness visit with nurse due Jan 23 or after, please schedule  Toradol 60 mg IM and DepoMedrol 80 mfg IM in office today for hip pain and medication also sent in for this, ibuprofen and prednisone      Decongestant perles are sent in for cough and congestion   I am excited about your upcoming cornea surgery , all  The best  Please be careful not to fall  Thank you  for choosing [facility]. We consider it a privelige to serve you.  Delivering excellent health care in a caring and  compassionate way is our goal.  Partnering with you,  so that together we can achieve this goal is our strategy.

## 2017-12-12 ENCOUNTER — Encounter: Payer: Self-pay | Admitting: Family Medicine

## 2017-12-12 DIAGNOSIS — M25551 Pain in right hip: Secondary | ICD-10-CM | POA: Diagnosis not present

## 2017-12-12 MED ORDER — KETOROLAC TROMETHAMINE 60 MG/2ML IM SOLN
60.0000 mg | Freq: Once | INTRAMUSCULAR | Status: AC
  Administered 2017-12-12: 60 mg via INTRAMUSCULAR

## 2017-12-12 MED ORDER — METHYLPREDNISOLONE ACETATE 80 MG/ML IJ SUSP
80.0000 mg | Freq: Once | INTRAMUSCULAR | Status: AC
  Administered 2017-12-12: 80 mg via INTRAMUSCULAR

## 2017-12-14 ENCOUNTER — Encounter: Payer: Self-pay | Admitting: Family Medicine

## 2017-12-14 DIAGNOSIS — R490 Dysphonia: Secondary | ICD-10-CM | POA: Insufficient documentation

## 2017-12-14 NOTE — Assessment & Plan Note (Signed)
Controlled, no change in medication  

## 2017-12-14 NOTE — Assessment & Plan Note (Signed)
Uncontrolled.Toradol and depo medrol administered IM in the office , to be followed by a short course of oral prednisone and NSAIDS.  

## 2017-12-14 NOTE — Assessment & Plan Note (Signed)
Controlled, no change in medication DASH diet and commitment to daily physical activity for a minimum of 30 minutes discussed and encouraged, as a part of hypertension management. The importance of attaining a healthy weight is also discussed.  BP/Weight 12/11/2017 09/26/2017 09/22/2017 06/24/2017 04/28/2017 12/30/2016 07/09/6552  Systolic BP 748 270 786 754 492 010 071  Diastolic BP 70 83 82 82 82 80 82  Wt. (Lbs) 202 199 199.25 197 206 205.04 206  BMI 32.6 32.12 32.16 31.8 33.25 33.09 33.25

## 2017-12-14 NOTE — Progress Notes (Signed)
   [name]     MRN: [id number]      DOB: Aug 04, 1951   HPI [name] is here  With c/o uncontrolled bilateral hip pain , right worse than left and also hoarseness x 2 weeks which awakens her, she denies any fever os chills, she denies productive cough or nasal drainage  ROS  Denies chest congestion, productive cough or wheezing. Denies chest pains, palpitations and leg swelling Denies abdominal pain, nausea, vomiting,diarrhea or constipation.   Denies dysuria, frequency, hesitancy or incontinence.  Denies headaches, seizures, numbness, or tingling. Denies depression, anxiety or insomnia. Denies skin break down or rash.   PE  BP 120/70   Pulse 90   Resp 16   Ht 5\' 6"  (1.676 m)   Wt 202 lb (91.6 kg)   SpO2 97%   BMI 32.60 kg/m   Patient alert and oriented and in no cardiopulmonary distress.  HEENT: No facial asymmetry, EOMI,   oropharynx pink and moist.  Neck supple no JVD, no mass.  Chest: Clear to auscultation bilaterally.  CVS: S1, S2 no murmurs, no S3.Regular rate.  ABD: Soft non tender.   Ext: No edema  MS: Decreased ROM spine, hips , shoulders and knees.  Skin: Intact, no ulcerations or rash noted.  Psych: Good eye contact, normal affect. Memory intact not anxious or depressed appearing.  CNS: CN 2-12 intact, power,  normal throughout.no focal deficits noted.   Assessment & Plan  HIP PAIN, RIGHT Uncontrolled.Toradol and depo medrol administered IM in the office , to be followed by a short course of oral prednisone and NSAIDS.   Hoarseness of voice Due to uncontrolled al;lergy symptoms, DepoMedrol and prednisone will treat  Essential hypertension Controlled, no change in medication DASH diet and commitment to daily physical activity for a minimum of 30 minutes discussed and encouraged, as a part of hypertension management. The importance of attaining a healthy weight is also discussed.  BP/Weight 12/11/2017 09/26/2017 09/22/2017 06/24/2017  04/28/2017 12/30/2016 3/00/7622  Systolic BP 633 354 562 563 893 734 287  Diastolic BP 70 83 82 82 82 80 82  Wt. (Lbs) 202 199 199.25 197 206 205.04 206  BMI 32.6 32.12 32.16 31.8 33.25 33.09 33.25       Depression Controlled, no change in medication   Obesity (BMI 30-39.9) Deteriorated. Patient re-educated about  the importance of commitment to a  minimum of 150 minutes of exercise per week.  The importance of healthy food choices with portion control discussed. Encouraged to start a food diary, count calories and to consider  joining a support group. Sample diet sheets offered. Goals set by the patient for the next several months.   Weight /BMI 12/11/2017 09/26/2017 09/22/2017  WEIGHT 202 lb 199 lb 199 lb 4 oz  HEIGHT 5\' 6"  5\' 6"  5\' 6"   BMI 32.6 kg/m2 32.12 kg/m2 32.16 kg/m2

## 2017-12-14 NOTE — Assessment & Plan Note (Signed)
Due to uncontrolled al;lergy symptoms, DepoMedrol and prednisone will treat

## 2017-12-14 NOTE — Assessment & Plan Note (Signed)
Deteriorated. Patient re-educated about  the importance of commitment to a  minimum of 150 minutes of exercise per week.  The importance of healthy food choices with portion control discussed. Encouraged to start a food diary, count calories and to consider  joining a support group. Sample diet sheets offered. Goals set by the patient for the next several months.   Weight /BMI 12/11/2017 09/26/2017 09/22/2017  WEIGHT 202 lb 199 lb 199 lb 4 oz  HEIGHT 5\' 6"  5\' 6"  5\' 6"   BMI 32.6 kg/m2 32.12 kg/m2 32.16 kg/m2

## 2017-12-25 ENCOUNTER — Other Ambulatory Visit: Payer: Self-pay | Admitting: Family Medicine

## 2018-01-15 ENCOUNTER — Other Ambulatory Visit: Payer: Self-pay | Admitting: Family Medicine

## 2018-01-19 ENCOUNTER — Other Ambulatory Visit: Payer: Self-pay | Admitting: Family Medicine

## 2018-01-29 ENCOUNTER — Other Ambulatory Visit: Payer: Self-pay | Admitting: Family Medicine

## 2018-02-13 ENCOUNTER — Other Ambulatory Visit: Payer: Self-pay | Admitting: Family Medicine

## 2018-02-19 ENCOUNTER — Ambulatory Visit: Payer: Medicare Other | Admitting: Family Medicine

## 2018-03-18 ENCOUNTER — Other Ambulatory Visit: Payer: Self-pay | Admitting: Family Medicine

## 2018-03-18 ENCOUNTER — Other Ambulatory Visit: Payer: Self-pay | Admitting: "Endocrinology

## 2018-03-20 DIAGNOSIS — E039 Hypothyroidism, unspecified: Secondary | ICD-10-CM | POA: Diagnosis not present

## 2018-03-20 LAB — T4, FREE: Free T4: 1.3 ng/dL (ref 0.8–1.8)

## 2018-03-20 LAB — TSH: TSH: 4.35 mIU/L (ref 0.40–4.50)

## 2018-03-27 ENCOUNTER — Ambulatory Visit: Payer: Medicare Other | Admitting: "Endocrinology

## 2018-03-31 ENCOUNTER — Encounter: Payer: Self-pay | Admitting: "Endocrinology

## 2018-03-31 ENCOUNTER — Ambulatory Visit (INDEPENDENT_AMBULATORY_CARE_PROVIDER_SITE_OTHER): Payer: Medicare Other | Admitting: "Endocrinology

## 2018-03-31 VITALS — BP 124/74 | HR 72 | Ht 66.0 in | Wt 195.0 lb

## 2018-03-31 DIAGNOSIS — E063 Autoimmune thyroiditis: Secondary | ICD-10-CM

## 2018-03-31 DIAGNOSIS — E049 Nontoxic goiter, unspecified: Secondary | ICD-10-CM | POA: Diagnosis not present

## 2018-03-31 DIAGNOSIS — E039 Hypothyroidism, unspecified: Secondary | ICD-10-CM

## 2018-03-31 MED ORDER — LEVOTHYROXINE SODIUM 88 MCG PO TABS: 88.0000 ug | ORAL_TABLET | Freq: Every day | ORAL | 1 refills | Status: DC

## 2018-03-31 NOTE — Progress Notes (Signed)
Subjective:    Patient ID: [name], female    DOB: 04-Mar-1951, PCP Fayrene Helper, MD   Past Medical History:  Diagnosis Date  . Allergy   . Anxiety   . Arthritis   . Asthma   . Hyperlipidemia   . Hypertension   . Vision abnormalities    states close to blindness due to cataracts   Past Surgical History:  Procedure Laterality Date  . caesarean    . cataracts    . CESAREAN SECTION    . TOTAL ABDOMINAL HYSTERECTOMY     prolapse   Social History   Socioeconomic History  . Marital status: Single    Spouse name: Not on file  . Number of children: Not on file  . Years of education: 12th grade  . Highest education level: Not on file  Occupational History  . Occupation: disabled    Fish farm manager: NOT EMPLOYED  Social Needs  . Financial resource strain: Not on file  . Food insecurity:    Worry: Not on file    Inability: Not on file  . Transportation needs:    Medical: Not on file    Non-medical: Not on file  Tobacco Use  . Smoking status: Former Smoker    Packs/day: 0.50    Years: 18.00    Pack years: 9.00    Last attempt to quit: 1989    Years since quitting: 30.3  . Smokeless tobacco: Never Used  Substance and Sexual Activity  . Alcohol use: No  . Drug use: No  . Sexual activity: Not Currently  Lifestyle  . Physical activity:    Days per week: Not on file    Minutes per session: Not on file  . Stress: Not on file  Relationships  . Social connections:    Talks on phone: Not on file    Gets together: Not on file    Attends religious service: Not on file    Active member of club or organization: Not on file    Attends meetings of clubs or organizations: Not on file    Relationship status: Not on file  Other Topics Concern  . Not on file  Social History Narrative  . Not on file   Outpatient Encounter Medications as of 03/31/2018  Medication Sig  . acetaminophen (TYLENOL) 650 MG CR tablet Take 650 mg by mouth as needed for pain.  Marland Kitchen  amLODipine (NORVASC) 10 MG tablet TAKE 1 TABLET BY MOUTH AT BEDTIME.  . benzonatate (TESSALON) 100 MG capsule Take 1 capsule (100 mg total) by mouth 2 (two) times daily as needed for cough.  . betamethasone dipropionate (DIPROLENE) 0.05 % cream Apply topically 2 (two) times daily. Apply sparingly twice daily to arms , back and chest, do NOT apply to face, only when itching excessively  . Calcium Carb-Cholecalciferol (CALCIUM 500 +D) 500-400 MG-UNIT TABS Take tab twice daily (Patient taking differently: Take 1 tablet by mouth 2 (two) times daily. Take tab twice daily)  . cetirizine (ZYRTEC) 10 MG tablet Take 1 tablet (10 mg total) by mouth daily.  . fluticasone (FLONASE) 50 MCG/ACT nasal spray USE 1 SPRAY IN EACH NOSTRIL DAILY.  Marland Kitchen ibuprofen (ADVIL,MOTRIN) 600 MG tablet One tablet twice daily  . levothyroxine (SYNTHROID, LEVOTHROID) 88 MCG tablet Take 1 tablet (88 mcg total) by mouth daily.  Marland Kitchen lovastatin (MEVACOR) 40 MG tablet TAKE (1) TABLET BY MOUTH AT BEDTIME.  . montelukast (SINGULAIR) 10 MG tablet TAKE (1) TABLET BY MOUTH  AT BEDTIME.  . nabumetone (RELAFEN) 750 MG tablet TAKE ONE TABLET BY MOUTH DAILY.  . pantoprazole (PROTONIX) 20 MG tablet TAKE ONE TABLET BY MOUTH DAILY.  Marland Kitchen PARoxetine (PAXIL) 20 MG tablet Take 1 tablet (20 mg total) by mouth daily.  Marland Kitchen SALINE NASAL MIST NA Place 1-2 sprays into the nose daily as needed (for congestion).  . SYMBICORT 80-4.5 MCG/ACT inhaler INHALE 2 PUFFS INTO THE LUNGS TWICE DAILY.  Marland Kitchen triamterene-hydrochlorothiazide (MAXZIDE-25) 37.5-25 MG tablet TAKE 1 TABLET BY MOUTH ONCE A DAY.  . VENTOLIN HFA 108 (90 Base) MCG/ACT inhaler INHALE 2 PUFFS INTO THE LUNGS EVERY 4 HOURS AS NEEDED FOR COUGHNG ANDWHEEZING.  . [DISCONTINUED] cetirizine (ZYRTEC) 10 MG tablet TAKE 1 TABLET BY MOUTH EVERY DAY.  . [DISCONTINUED] cetirizine (ZYRTEC) 10 MG tablet TAKE 1 TABLET BY MOUTH EVERY DAY.  . [DISCONTINUED] cetirizine (ZYRTEC) 10 MG tablet TAKE 1 TABLET BY MOUTH EVERY DAY.  .  [DISCONTINUED] cetirizine (ZYRTEC) 10 MG tablet TAKE 1 TABLET BY MOUTH EVERY DAY.  . [DISCONTINUED] levothyroxine (SYNTHROID, LEVOTHROID) 75 MCG tablet TAKE 1 TABLET BY MOUTH DAILY.  . [DISCONTINUED] predniSONE (STERAPRED UNI-PAK 21 TAB) 5 MG (21) TBPK tablet Take 1 tablet (5 mg total) by mouth as directed. Use as directed   No facility-administered encounter medications on file as of 03/31/2018.    ALLERGIES: Allergies  Allergen Reactions  . Lotensin Hct [Benazepril-Hydrochlorothiazide] Hives    VACCINATION STATUS: Immunization History  Administered Date(s) Administered  . H1N1 11/15/2008  . Influenza Split 08/30/2011, 08/14/2012  . Influenza Whole 09/15/2009, 09/26/2010  . Influenza,inj,Quad PF,6+ Mos 10/18/2013, 08/25/2014, 12/13/2015, 08/05/2016, 09/22/2017  . Pneumococcal Conjugate-13 05/03/2015  . Pneumococcal Polysaccharide-23 08/05/2016  . Td 03/02/2004  . Tdap 12/12/2011    HPI 67 year old female patient with medical history as above.  She is returning with repeat thyroid function tests for follow-up of hypothyroidism.    - She is currently on levothyroxine 75 mcg p.o. every morning.   She is compliant with his medication taking given orally in the morning on empty stomach with water. She started to feel better.  Lost 7 pounds since last visit.  She has no new complaints.  - She is a poor historian accompanied by her sister. She denies any prior history of thyroid dysfunction.  - She denies dysphagia, shortness of breath, nor voice change. She had  a series of thyroid ultrasound studies, last one from 05/02/2017 showing 1 cm solitary nodule on the left lobe of the thyroid . - Ultrasound description of the thyroid summarized below. 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  Current Outpatient Medications:  .  acetaminophen (TYLENOL) 650 MG CR tablet, Take 650 mg by mouth as needed for pain., Disp: , Rfl:  .   amLODipine (NORVASC) 10 MG tablet, TAKE 1 TABLET BY MOUTH AT BEDTIME., Disp: 90 tablet, Rfl: 0 .  benzonatate (TESSALON) 100 MG capsule, Take 1 capsule (100 mg total) by mouth 2 (two) times daily as needed for cough., Disp: 14 capsule, Rfl: 0 .  betamethasone dipropionate (DIPROLENE) 0.05 % cream, Apply topically 2 (two) times daily. Apply sparingly twice daily to arms , back and chest, do NOT apply to face, only when itching excessively, Disp: 30 g, Rfl: 0 .  Calcium Carb-Cholecalciferol (CALCIUM 500 +D) 500-400 MG-UNIT TABS, Take tab twice daily (Patient taking differently: Take 1 tablet by mouth 2 (two) times daily. Take tab twice daily), Disp: 60 tablet, Rfl: 11 .  cetirizine (  ZYRTEC) 10 MG tablet, Take 1 tablet (10 mg total) by mouth daily., Disp: 90 tablet, Rfl: 1 .  fluticasone (FLONASE) 50 MCG/ACT nasal spray, USE 1 SPRAY IN EACH NOSTRIL DAILY., Disp: 16 g, Rfl: 0 .  ibuprofen (ADVIL,MOTRIN) 600 MG tablet, One tablet twice daily, Disp: 14 tablet, Rfl: 0 .  levothyroxine (SYNTHROID, LEVOTHROID) 88 MCG tablet, Take 1 tablet (88 mcg total) by mouth daily., Disp: 90 tablet, Rfl: 1 .  lovastatin (MEVACOR) 40 MG tablet, TAKE (1) TABLET BY MOUTH AT BEDTIME., Disp: 90 tablet, Rfl: 0 .  montelukast (SINGULAIR) 10 MG tablet, TAKE (1) TABLET BY MOUTH AT BEDTIME., Disp: 90 tablet, Rfl: 0 .  nabumetone (RELAFEN) 750 MG tablet, TAKE ONE TABLET BY MOUTH DAILY., Disp: 90 tablet, Rfl: 0 .  pantoprazole (PROTONIX) 20 MG tablet, TAKE ONE TABLET BY MOUTH DAILY., Disp: 30 tablet, Rfl: 3 .  PARoxetine (PAXIL) 20 MG tablet, Take 1 tablet (20 mg total) by mouth daily., Disp: 90 tablet, Rfl: 3 .  SALINE NASAL MIST NA, Place 1-2 sprays into the nose daily as needed (for congestion)., Disp: , Rfl:  .  SYMBICORT 80-4.5 MCG/ACT inhaler, INHALE 2 PUFFS INTO THE LUNGS TWICE DAILY., Disp: 10.2 g, Rfl: 0 .  triamterene-hydrochlorothiazide (MAXZIDE-25) 37.5-25 MG tablet, TAKE 1 TABLET BY MOUTH ONCE A DAY., Disp: 30 tablet,  Rfl: 0 .  VENTOLIN HFA 108 (90 Base) MCG/ACT inhaler, INHALE 2 PUFFS INTO THE LUNGS EVERY 4 HOURS AS NEEDED FOR COUGHNG ANDWHEEZING., Disp: 18 g, Rfl: 0  Past Medical History:  Diagnosis Date  . Allergy   . Anxiety   . Arthritis   . Asthma   . Hyperlipidemia   . Hypertension   . Vision abnormalities    states close to blindness due to cataracts   Past Surgical History:  Procedure Laterality Date  . caesarean    . cataracts    . CESAREAN SECTION    . TOTAL ABDOMINAL HYSTERECTOMY     prolapse   Review of Systems  Constitutional:  + Progressive weight loss of 7 pounds since January 2019,  - fatigue, no subjective hyperthermia, no subjective hypothermia Eyes: no blurry vision, no xerophthalmia ENT: + Severe visual impairment due to corneal defect, no sore throat, no nodules palpated in throat, no dysphagia/odynophagia, no hoarseness Cardiovascular: no Chest Pain, no Shortness of Breath, no palpitations, no leg swelling Respiratory: no cough, no SOB Gastrointestinal: no Nausea/Vomiting/Diarhhea Musculoskeletal: no muscle/joint aches Skin: no rashes Neurological: no tremors, no numbness, no tingling, no dizziness Psychiatric: no depression, no anxiety  Objective:    BP 124/74   Pulse 72   Ht 5\' 6"  (1.676 m)   Wt 195 lb (88.5 kg)   BMI 31.47 kg/m   Wt Readings from Last 3 Encounters:  03/31/18 195 lb (88.5 kg)  12/11/17 202 lb (91.6 kg)  09/26/17 199 lb (90.3 kg)    Physical Exam  Constitutional:  + Obese, not in acute distress, normal state of mind.  uses her walking cane due to visual impairment. Eyes: PERRLA, EOMI, no exophthalmos ENT: moist mucous membranes, no thyromegaly, no cervical lymphadenopathy  Musculoskeletal: no gross deformities, strength intact in all four extremities Skin: moist, warm, no rashes Neurological: no tremor with outstretched hands, Deep tendon reflexes normal in all four extremities.  CMP ( most recent) CMP     Component Value  Date/Time   NA 143 09/22/2017 1217   K 3.5 09/22/2017 1217   CL 104 09/22/2017 1217   CO2 31 09/22/2017  1217   GLUCOSE 96 09/22/2017 1217   BUN 23 09/22/2017 1217   CREATININE 0.90 09/22/2017 1217   CALCIUM 9.6 09/22/2017 1217   PROT 7.5 09/22/2017 1217   ALBUMIN 3.9 04/21/2017 0846   AST 22 09/22/2017 1217   ALT 22 09/22/2017 1217   ALKPHOS 49 04/21/2017 0846   BILITOT 0.4 09/22/2017 1217   GFRNONAA 66 09/22/2017 1217   GFRAA 77 09/22/2017 1217     Diabetic Labs (most recent): Lab Results  Component Value Date   HGBA1C 5.7 (H) 04/21/2017   HGBA1C 5.7 (H) 08/05/2016   HGBA1C 5.9 (H) 12/13/2015     Lipid Panel ( most recent) Lipid Panel     Component Value Date/Time   CHOL 195 09/22/2017 1217   TRIG 82 09/22/2017 1217   HDL 84 09/22/2017 1217   CHOLHDL 2.3 09/22/2017 1217   VLDL 13 04/21/2017 0846   LDLCALC 94 09/22/2017 1217     Last ultrasound of the thyroid from 05/02/2017 showed right lobe 4.2 cm previously 4.2 cm, left lobe 3.2 cm previously 4.3 cm. There is a 1 cm nodule on the lower part of the left lobe of the thyroid. This nodule was reported to be stable from prior studies.    Results for [name], [name] (MRN [id number]) as of 03/31/2018 10:39  Ref. Range 09/22/2017 12:20 03/20/2018 11:39  TSH Latest Ref Range: 0.40 - 4.50 mIU/L 3.84 4.35  T4,Free(Direct) Latest Ref Range: 0.8 - 1.8 ng/dL 1.3 1.3  Thyroglobulin Ab Latest Ref Range: < or = 1 IU/mL <1   Thyroperoxidase Ab SerPl-aCnc Latest Ref Range: <9 IU/mL 869 (H)    Assessment & Plan:   1. Hypothyroidism due to Hashimoto's thyroiditis  - She has done very well on levothyroxine. Based on her labs, she would benefit from slight increase in her dose.  - I discussed and increased her levothyroxine to 88 mcg p.o. every morning.     - We discussed about correct intake of levothyroxine, at fasting, with water, separated by at least 30 minutes from breakfast, and separated by more than 4 hours from calcium,  iron, multivitamins, acid reflux medications (PPIs). -Patient is made aware of the fact that thyroid hormone replacement is needed for life, dose to be adjusted by periodic monitoring of thyroid function tests.  2. Nodular goiter-  - In the EMR records, she has a series of thyroid/neck sonograms, last on in May 2018,  showing stable solitary left lobe nodule at 1 cm in maximum dimension with no suspicious features. She will not require intervention at this time. - She may need repeat physical exam and ultrasound if necessary in 2 years.  - I advised patient to maintain close follow up with Fayrene Helper, MD for primary care needs. Follow up plan: Return in about 6 months (around 09/30/2018) for follow up with pre-visit labs.  Glade Lloyd, MD Phone: [phone]  Fax: [phone]  -  This note was partially dictated with voice recognition software. Similar sounding words can be transcribed inadequately or may not  be corrected upon review.  03/31/2018, 12:55 PM

## 2018-04-09 ENCOUNTER — Other Ambulatory Visit: Payer: Self-pay | Admitting: Family Medicine

## 2018-04-13 ENCOUNTER — Other Ambulatory Visit: Payer: Self-pay | Admitting: Family Medicine

## 2018-04-15 ENCOUNTER — Encounter: Payer: Self-pay | Admitting: Family Medicine

## 2018-04-15 ENCOUNTER — Ambulatory Visit (INDEPENDENT_AMBULATORY_CARE_PROVIDER_SITE_OTHER): Payer: Medicare Other | Admitting: Family Medicine

## 2018-04-15 VITALS — BP 128/82 | HR 88 | Resp 16 | Ht 66.0 in | Wt 192.0 lb

## 2018-04-15 DIAGNOSIS — E039 Hypothyroidism, unspecified: Secondary | ICD-10-CM

## 2018-04-15 DIAGNOSIS — E559 Vitamin D deficiency, unspecified: Secondary | ICD-10-CM

## 2018-04-15 DIAGNOSIS — E669 Obesity, unspecified: Secondary | ICD-10-CM | POA: Diagnosis not present

## 2018-04-15 DIAGNOSIS — Z1231 Encounter for screening mammogram for malignant neoplasm of breast: Secondary | ICD-10-CM | POA: Diagnosis not present

## 2018-04-15 DIAGNOSIS — E785 Hyperlipidemia, unspecified: Secondary | ICD-10-CM

## 2018-04-15 DIAGNOSIS — M25551 Pain in right hip: Secondary | ICD-10-CM

## 2018-04-15 DIAGNOSIS — H547 Unspecified visual loss: Secondary | ICD-10-CM | POA: Diagnosis not present

## 2018-04-15 DIAGNOSIS — Z9109 Other allergy status, other than to drugs and biological substances: Secondary | ICD-10-CM | POA: Diagnosis not present

## 2018-04-15 DIAGNOSIS — I10 Essential (primary) hypertension: Secondary | ICD-10-CM

## 2018-04-15 DIAGNOSIS — R7303 Prediabetes: Secondary | ICD-10-CM

## 2018-04-15 DIAGNOSIS — E66811 Obesity, class 1: Secondary | ICD-10-CM

## 2018-04-15 NOTE — Patient Instructions (Addendum)
Wellness with nurse past due, please schedule end August, flu vaccine at that visit  Please schedule mammogram July 24 or after and give appt info at checkout   Fasting cBC, lipid, cmp and EGFR, HBA1C and Vit D today  Congrats on weight loss , keep it up  Be careful not to fall!  Thank you  for choosing [facility]. We consider it a privelige to serve you.  Delivering excellent health care in a caring and  compassionate way is our goal.  Partnering with you,  so that together we can achieve this goal is our strategy.       

## 2018-04-16 ENCOUNTER — Other Ambulatory Visit: Payer: Self-pay | Admitting: Family Medicine

## 2018-04-16 LAB — CBC
HCT: 38.4 % (ref 35.0–45.0)
Hemoglobin: 13.3 g/dL (ref 11.7–15.5)
MCH: 32.5 pg (ref 27.0–33.0)
MCHC: 34.6 g/dL (ref 32.0–36.0)
MCV: 93.9 fL (ref 80.0–100.0)
MPV: 12.7 fL — ABNORMAL HIGH (ref 7.5–12.5)
Platelets: 225 10*3/uL (ref 140–400)
RBC: 4.09 10*6/uL (ref 3.80–5.10)
RDW: 12.3 % (ref 11.0–15.0)
WBC: 6.3 10*3/uL (ref 3.8–10.8)

## 2018-04-16 LAB — COMPLETE METABOLIC PANEL WITH GFR
AG Ratio: 1.2 (calc) (ref 1.0–2.5)
ALT: 22 U/L (ref 6–29)
AST: 22 U/L (ref 10–35)
Albumin: 4.2 g/dL (ref 3.6–5.1)
Alkaline phosphatase (APISO): 51 U/L (ref 33–130)
BUN: 24 mg/dL (ref 7–25)
CO2: 29 mmol/L (ref 20–32)
Calcium: 9.9 mg/dL (ref 8.6–10.4)
Chloride: 104 mmol/L (ref 98–110)
Creat: 0.84 mg/dL (ref 0.50–0.99)
GFR, Est African American: 83 mL/min/{1.73_m2} (ref >=60)
GFR, Est Non African American: 72 mL/min/{1.73_m2} (ref >=60)
Globulin: 3.6 g/dL (calc) (ref 1.9–3.7)
Glucose, Bld: 88 mg/dL (ref 65–99)
Potassium: 3.9 mmol/L (ref 3.5–5.3)
Sodium: 142 mmol/L (ref 135–146)
Total Bilirubin: 0.4 mg/dL (ref 0.2–1.2)
Total Protein: 7.8 g/dL (ref 6.1–8.1)

## 2018-04-16 LAB — LIPID PANEL
Cholesterol: 200 mg/dL — ABNORMAL HIGH (ref <200)
HDL: 73 mg/dL (ref >50)
LDL Cholesterol (Calc): 110 mg/dL (calc) — ABNORMAL HIGH
Non-HDL Cholesterol (Calc): 127 mg/dL (calc) (ref <130)
Total CHOL/HDL Ratio: 2.7 (calc) (ref <5.0)
Triglycerides: 78 mg/dL (ref <150)

## 2018-04-16 LAB — HEMOGLOBIN A1C
Hgb A1c MFr Bld: 5.8 % of total Hgb — ABNORMAL HIGH (ref <5.7)
Mean Plasma Glucose: 120 (calc)
eAG (mmol/L): 6.6 (calc)

## 2018-04-16 LAB — VITAMIN D 25 HYDROXY (VIT D DEFICIENCY, FRACTURES): Vit D, 25-Hydroxy: 19 ng/mL — ABNORMAL LOW (ref 30–100)

## 2018-04-16 MED ORDER — ERGOCALCIFEROL 1.25 MG (50000 UT) PO CAPS: 50000.0000 [IU] | ORAL_CAPSULE | ORAL | 3 refills | Status: DC

## 2018-04-16 NOTE — Progress Notes (Signed)
Vit d 

## 2018-04-17 ENCOUNTER — Encounter: Payer: Self-pay | Admitting: Family Medicine

## 2018-04-17 NOTE — Assessment & Plan Note (Signed)
Chronic and unchanged, no current flare 

## 2018-04-17 NOTE — Assessment & Plan Note (Signed)
Controlled, no change in medication DASH diet and commitment to daily physical activity for a minimum of 30 minutes discussed and encouraged, as a part of hypertension management. The importance of attaining a healthy weight is also discussed.  BP/Weight 04/15/2018 03/31/2018 12/11/2017 09/26/2017 09/22/2017 06/24/2017 4/66/5993  Systolic BP 570 177 939 030 092 330 076  Diastolic BP 82 74 70 83 82 82 82  Wt. (Lbs) 192 195 202 199 199.25 197 206  BMI 30.99 31.47 32.6 32.12 32.16 31.8 33.25

## 2018-04-17 NOTE — Assessment & Plan Note (Signed)
.  home safety and fall precautions reviewed.

## 2018-04-17 NOTE — Progress Notes (Signed)
[name]     MRN: [id number]      DOB: Oct 18, 1951   HPI [name] is here for follow up and re-evaluation of chronic medical conditions, medication management and review of any available recent lab and radiology data.  Preventive health is updated, specifically  Cancer screening and Immunization.   The PT denies any adverse reactions to current medications since the last visit.  There are no new concerns.  There are no specific complaints   ROS Denies recent fever or chills. Denies sinus pressure, nasal congestion, ear pain or sore throat. Denies chest congestion, productive cough or wheezing. Denies chest pains, palpitations and leg swelling Denies abdominal pain, nausea, vomiting,diarrhea or constipation.   Denies dysuria, frequency, hesitancy or incontinence. C/o  joint pain, right hip pain  and limitation in mobility. Denies headaches, seizures, numbness, or tingling. Denies depression, anxiety or insomnia. Denies skin break down or rash.   PE  BP 128/82   Pulse 88   Resp 16   Ht 5\' 6"  (1.676 m)   Wt 192 lb (87.1 kg)   SpO2 98%   BMI 30.99 kg/m   Patient alert and oriented and in no cardiopulmonary distress.  HEENT: No facial asymmetry, EOMI,   oropharynx pink and moist.  Neck supple no JVD, no mass.  Chest: Clear to auscultation bilaterally.  CVS: S1, S2 no murmurs, no S3.Regular rate.  ABD: Soft non tender.   Ext: No edema  MS: Decreased  ROM spine, right , hips normal in shoulders and rduced in  knees.  Skin: Intact, no ulcerations or rash noted.  Psych: Good eye contact, normal affect. Memory intact not anxious or depressed appearing.  CNS: CN 2-12 intact, power,  normal throughout.no focal deficits noted.   Assessment & Plan  Essential hypertension Controlled, no change in medication DASH diet and commitment to daily physical activity for a minimum of 30 minutes discussed and encouraged, as a part of hypertension management. The  importance of attaining a healthy weight is also discussed.  BP/Weight 04/15/2018 03/31/2018 12/11/2017 09/26/2017 09/22/2017 06/24/2017 4/62/7035  Systolic BP 009 381 829 937 169 678 938  Diastolic BP 82 74 70 83 82 82 82  Wt. (Lbs) 192 195 202 199 199.25 197 206  BMI 30.99 31.47 32.6 32.12 32.16 31.8 33.25       Hyperlipidemia LDL goal <100 Hyperlipidemia:Low fat diet discussed and encouraged.   Lipid Panel  Lab Results  Component Value Date   CHOL 200 (H) 04/15/2018   HDL 73 04/15/2018   LDLCALC 110 (H) 04/15/2018   TRIG 78 04/15/2018   CHOLHDL 2.7 04/15/2018   Needs to reduce intake of fried and fatty foods, not at goal   Prediabetes Patient educated about the importance of limiting  Carbohydrate intake , the need to commit to daily physical activity for a minimum of 30 minutes , and to commit weight loss. The fact that changes in all these areas will reduce or eliminate all together the development of diabetes is stressed.  Deteriorated, needs to reduce sweet and carb intrake  Diabetic Labs Latest Ref Rng & Units 04/15/2018 09/22/2017 04/21/2017 08/05/2016 12/13/2015  HbA1c <5.7 % of total Hgb 5.8(H) - 5.7(H) 5.7(H) 5.9(H)  Chol <200 mg/dL 200(H) 195 186 181 205(H)  HDL >50 mg/dL 73 84 83 65 84  Calc LDL mg/dL (calc) 110(H) 94 90 96 96  Triglycerides <150 mg/dL 78 82 66 98 123  Creatinine 0.50 - 0.99 mg/dL 0.84 0.90 0.97 0.85  0.76   BP/Weight 04/15/2018 03/31/2018 12/11/2017 09/26/2017 09/22/2017 06/24/2017 03/23/8308  Systolic BP 407 680 881 103 159 458 592  Diastolic BP 82 74 70 83 82 82 82  Wt. (Lbs) 192 195 202 199 199.25 197 206  BMI 30.99 31.47 32.6 32.12 32.16 31.8 33.25   No flowsheet data found.    Obesity (BMI 30.0-34.9) Improved. Pt applauded on succesful weight loss through lifestyle change, and encouraged to continue same. Weight loss goal set for the next several months.   Multiple environmental allergies Controlled, no change in medication   Loss of  vision .home safety and fall precautions reviewed.   HIP PAIN, RIGHT Chronic and unchanged, no current flare

## 2018-04-17 NOTE — Assessment & Plan Note (Signed)
Controlled, no change in medication  

## 2018-04-17 NOTE — Assessment & Plan Note (Signed)
Improved. Pt applauded on succesful weight loss through lifestyle change, and encouraged to continue same. Weight loss goal set for the next several months.  

## 2018-04-17 NOTE — Assessment & Plan Note (Signed)
Patient educated about the importance of limiting  Carbohydrate intake , the need to commit to daily physical activity for a minimum of 30 minutes , and to commit weight loss. The fact that changes in all these areas will reduce or eliminate all together the development of diabetes is stressed.  Deteriorated, needs to reduce sweet and carb intrake  Diabetic Labs Latest Ref Rng & Units 04/15/2018 09/22/2017 04/21/2017 08/05/2016 12/13/2015  HbA1c <5.7 % of total Hgb 5.8(H) - 5.7(H) 5.7(H) 5.9(H)  Chol <200 mg/dL 200(H) 195 186 181 205(H)  HDL >50 mg/dL 73 84 83 65 84  Calc LDL mg/dL (calc) 110(H) 94 90 96 96  Triglycerides <150 mg/dL 78 82 66 98 123  Creatinine 0.50 - 0.99 mg/dL 0.84 0.90 0.97 0.85 0.76   BP/Weight 04/15/2018 03/31/2018 12/11/2017 09/26/2017 09/22/2017 06/24/2017 1/70/0174  Systolic BP 944 967 591 638 466 599 357  Diastolic BP 82 74 70 83 82 82 82  Wt. (Lbs) 192 195 202 199 199.25 197 206  BMI 30.99 31.47 32.6 32.12 32.16 31.8 33.25   No flowsheet data found.

## 2018-04-17 NOTE — Assessment & Plan Note (Signed)
Hyperlipidemia:Low fat diet discussed and encouraged.   Lipid Panel  Lab Results  Component Value Date   CHOL 200 (H) 04/15/2018   HDL 73 04/15/2018   LDLCALC 110 (H) 04/15/2018   TRIG 78 04/15/2018   CHOLHDL 2.7 04/15/2018   Needs to reduce intake of fried and fatty foods, not at goal

## 2018-04-20 ENCOUNTER — Ambulatory Visit (INDEPENDENT_AMBULATORY_CARE_PROVIDER_SITE_OTHER): Payer: Medicare Other

## 2018-04-20 VITALS — BP 128/78 | HR 65 | Resp 16 | Ht 66.0 in | Wt 195.0 lb

## 2018-04-20 DIAGNOSIS — Z0000 Encounter for general adult medical examination without abnormal findings: Secondary | ICD-10-CM

## 2018-04-20 NOTE — Patient Instructions (Signed)
[name] , Thank you for taking time to come for your Medicare Wellness Visit. I appreciate your ongoing commitment to your health goals. Please review the following plan we discussed and let me know if I can assist you in the future.   Screening recommendations/referrals: Colonoscopy: due 2022 Mammogram: up to date  Bone Density: complete  Recommended yearly ophthalmology/optometry visit for glaucoma screening and checkup Recommended yearly dental visit for hygiene and checkup  Vaccinations: Influenza vaccine: done  Pneumococcal vaccine: done  Tdap vaccine:due 2023 Shingles vaccine: due    Advanced directives: info given   Conditions/risks identified: yes   Next appointment: scheduled    Preventive Care 14 Years and Older, Female Preventive care refers to lifestyle choices and visits with your health care provider that can promote health and wellness. What does preventive care include?  A yearly physical exam. This is also called an annual well check.  Dental exams once or twice a year.  Routine eye exams. Ask your health care provider how often you should have your eyes checked.  Personal lifestyle choices, including:  Daily care of your teeth and gums.  Regular physical activity.  Eating a healthy diet.  Avoiding tobacco and drug use.  Limiting alcohol use.  Practicing safe sex.  Taking low-dose aspirin every day.  Taking vitamin and mineral supplements as recommended by your health care provider. What happens during an annual well check? The services and screenings done by your health care provider during your annual well check will depend on your age, overall health, lifestyle risk factors, and family history of disease. Counseling  Your health care provider may ask you questions about your:  Alcohol use.  Tobacco use.  Drug use.  Emotional well-being.  Home and relationship well-being.  Sexual activity.  Eating habits.  History of  falls.  Memory and ability to understand (cognition).  Work and work Statistician.  Reproductive health. Screening  You may have the following tests or measurements:  Height, weight, and BMI.  Blood pressure.  Lipid and cholesterol levels. These may be checked every 5 years, or more frequently if you are over 40 years old.  Skin check.  Lung cancer screening. You may have this screening every year starting at age 7 if you have a 30-pack-year history of smoking and currently smoke or have quit within the past 15 years.  Fecal occult blood test (FOBT) of the stool. You may have this test every year starting at age 34.  Flexible sigmoidoscopy or colonoscopy. You may have a sigmoidoscopy every 5 years or a colonoscopy every 10 years starting at age 69.  Hepatitis C blood test.  Hepatitis B blood test.  Sexually transmitted disease (STD) testing.  Diabetes screening. This is done by checking your blood sugar (glucose) after you have not eaten for a while (fasting). You may have this done every 1-3 years.  Bone density scan. This is done to screen for osteoporosis. You may have this done starting at age 49.  Mammogram. This may be done every 1-2 years. Talk to your health care provider about how often you should have regular mammograms. Talk with your health care provider about your test results, treatment options, and if necessary, the need for more tests. Vaccines  Your health care provider may recommend certain vaccines, such as:  Influenza vaccine. This is recommended every year.  Tetanus, diphtheria, and acellular pertussis (Tdap, Td) vaccine. You may need a Td booster every 10 years.  Zoster vaccine. You may need  this after age 7.  Pneumococcal 13-valent conjugate (PCV13) vaccine. One dose is recommended after age 81.  Pneumococcal polysaccharide (PPSV23) vaccine. One dose is recommended after age 64. Talk to your health care provider about which screenings and  vaccines you need and how often you need them. This information is not intended to replace advice given to you by your health care provider. Make sure you discuss any questions you have with your health care provider. Document Released: 12/22/2015 Document Revised: 08/14/2016 Document Reviewed: 09/26/2015 Elsevier Interactive Patient Education  2017 [place] Prevention in the Home Falls can cause injuries. They can happen to people of all ages. There are many things you can do to make your home safe and to help prevent falls. What can I do on the outside of my home?  Regularly fix the edges of walkways and driveways and fix any cracks.  Remove anything that might make you trip as you walk through a door, such as a raised step or threshold.  Trim any bushes or trees on the path to your home.  Use bright outdoor lighting.  Clear any walking paths of anything that might make someone trip, such as rocks or tools.  Regularly check to see if handrails are loose or broken. Make sure that both sides of any steps have handrails.  Any raised decks and porches should have guardrails on the edges.  Have any leaves, snow, or ice cleared regularly.  Use sand or salt on walking paths during winter.  Clean up any spills in your garage right away. This includes oil or grease spills. What can I do in the bathroom?  Use night lights.  Install grab bars by the toilet and in the tub and shower. Do not use towel bars as grab bars.  Use non-skid mats or decals in the tub or shower.  If you need to sit down in the shower, use a plastic, non-slip stool.  Keep the floor dry. Clean up any water that spills on the floor as soon as it happens.  Remove soap buildup in the tub or shower regularly.  Attach bath mats securely with double-sided non-slip rug tape.  Do not have throw rugs and other things on the floor that can make you trip. What can I do in the bedroom?  Use night  lights.  Make sure that you have a light by your bed that is easy to reach.  Do not use any sheets or blankets that are too big for your bed. They should not hang down onto the floor.  Have a firm chair that has side arms. You can use this for support while you get dressed.  Do not have throw rugs and other things on the floor that can make you trip. What can I do in the kitchen?  Clean up any spills right away.  Avoid walking on wet floors.  Keep items that you use a lot in easy-to-reach places.  If you need to reach something above you, use a strong step stool that has a grab bar.  Keep electrical cords out of the way.  Do not use floor polish or wax that makes floors slippery. If you must use wax, use non-skid floor wax.  Do not have throw rugs and other things on the floor that can make you trip. What can I do with my stairs?  Do not leave any items on the stairs.  Make sure that there are handrails on both sides of  the stairs and use them. Fix handrails that are broken or loose. Make sure that handrails are as long as the stairways.  Check any carpeting to make sure that it is firmly attached to the stairs. Fix any carpet that is loose or worn.  Avoid having throw rugs at the top or bottom of the stairs. If you do have throw rugs, attach them to the floor with carpet tape.  Make sure that you have a light switch at the top of the stairs and the bottom of the stairs. If you do not have them, ask someone to add them for you. What else can I do to help prevent falls?  Wear shoes that:  Do not have high heels.  Have rubber bottoms.  Are comfortable and fit you well.  Are closed at the toe. Do not wear sandals.  If you use a stepladder:  Make sure that it is fully opened. Do not climb a closed stepladder.  Make sure that both sides of the stepladder are locked into place.  Ask someone to hold it for you, if possible.  Clearly mark and make sure that you can  see:  Any grab bars or handrails.  First and last steps.  Where the edge of each step is.  Use tools that help you move around (mobility aids) if they are needed. These include:  Canes.  Walkers.  Scooters.  Crutches.  Turn on the lights when you go into a dark area. Replace any light bulbs as soon as they burn out.  Set up your furniture so you have a clear path. Avoid moving your furniture around.  If any of your floors are uneven, fix them.  If there are any pets around you, be aware of where they are.  Review your medicines with your doctor. Some medicines can make you feel dizzy. This can increase your chance of falling. Ask your doctor what other things that you can do to help prevent falls. This information is not intended to replace advice given to you by your health care provider. Make sure you discuss any questions you have with your health care provider. Document Released: 09/21/2009 Document Revised: 05/02/2016 Document Reviewed: 12/30/2014 Elsevier Interactive Patient Education  2017 [employer].

## 2018-04-20 NOTE — Progress Notes (Signed)
Subjective:   [name] is a 67 y.o. female who presents for Medicare Annual (Subsequent) preventive examination.  Review of Systems:   Cardiac Risk Factors include: advanced age (>52men, >70 women);hypertension;dyslipidemia     Objective:     Vitals: BP 128/78   Pulse 65   Resp 16   Ht 5\' 6"  (1.676 m)   Wt 195 lb (88.5 kg)   SpO2 98%   BMI 31.47 kg/m   Body mass index is 31.47 kg/m.  Advanced Directives 04/20/2018 12/30/2016 03/26/2015  Does Patient Have a Medical Advance Directive? No No No  Would patient like information on creating a medical advance directive? Yes (ED - Information included in AVS) Yes (MAU/Ambulatory/Procedural Areas - Information given) No - patient declined information    Tobacco Social History   Tobacco Use  Smoking Status Former Smoker  . Packs/day: 0.50  . Years: 18.00  . Pack years: 9.00  . Last attempt to quit: 1989  . Years since quitting: 30.3  Smokeless Tobacco Never Used     Counseling given: Not Answered   Clinical Intake:  Pre-visit preparation completed: Yes  Pain : 0-10 Pain Type: Chronic pain Pain Location: Hip Pain Orientation: Right     Nutritional Status: BMI > 30  Obese  How often do you need to have someone help you when you read instructions, pamphlets, or other written materials from your doctor or pharmacy?: 5 - Always What is the last grade level you completed in school?: 12th grade   Interpreter Needed?: No  Information entered by :: Yaira Bernardi LPN   Past Medical History:  Diagnosis Date  . Allergy   . Anxiety   . Arthritis   . Asthma   . Hyperlipidemia   . Hypertension   . Vision abnormalities    states close to blindness due to cataracts   Past Surgical History:  Procedure Laterality Date  . caesarean    . cataracts    . CESAREAN SECTION    . TOTAL ABDOMINAL HYSTERECTOMY     prolapse   Family History  Problem Relation Age of Onset  . Cancer Mother 75       stomach  . Kidney  disease Mother   . Hypertension Mother   . Diabetes Father   . Cancer Father        lung   . Diabetes Sister   . Alcohol abuse Brother   . Hyperlipidemia Sister   . Diabetes Sister   . Diabetes Sister   . Diabetes Brother   . Diabetes Brother   . Diabetes Brother   . Diabetes Brother   . Cancer Daughter 40       cervix  . Arthritis Unknown   . Asthma Unknown    Social History   Socioeconomic History  . Marital status: Single    Spouse name: Not on file  . Number of children: Not on file  . Years of education: 12th grade  . Highest education level: Not on file  Occupational History  . Occupation: disabled    Fish farm manager: NOT EMPLOYED  Social Needs  . Financial resource strain: Not hard at all  . Food insecurity:    Worry: Never true    Inability: Never true  . Transportation needs:    Medical: No    Non-medical: No  Tobacco Use  . Smoking status: Former Smoker    Packs/day: 0.50    Years: 18.00    Pack years: 9.00  Last attempt to quit: 1989    Years since quitting: 30.3  . Smokeless tobacco: Never Used  Substance and Sexual Activity  . Alcohol use: No  . Drug use: No  . Sexual activity: Not Currently  Lifestyle  . Physical activity:    Days per week: 0 days    Minutes per session: 0 min  . Stress: Not at all  Relationships  . Social connections:    Talks on phone: Once a week    Gets together: Once a week    Attends religious service: More than 4 times per year    Active member of club or organization: Yes    Attends meetings of clubs or organizations: More than 4 times per year    Relationship status: Never married  Other Topics Concern  . Not on file  Social History Narrative  . Not on file    Outpatient Encounter Medications as of 04/20/2018  Medication Sig  . acetaminophen (TYLENOL) 650 MG CR tablet Take 650 mg by mouth as needed for pain.  Marland Kitchen amLODipine (NORVASC) 10 MG tablet TAKE 1 TABLET BY MOUTH AT BEDTIME.  . Calcium Carb-Cholecalciferol  (CALCIUM 500 +D) 500-400 MG-UNIT TABS Take tab twice daily (Patient taking differently: Take 1 tablet by mouth 2 (two) times daily. Take tab twice daily)  . cetirizine (ZYRTEC) 10 MG tablet Take 1 tablet (10 mg total) by mouth daily.  . ergocalciferol (VITAMIN D2) 50000 units capsule Take 1 capsule (50,000 Units total) by mouth once a week. One capsule once weekly  . fluticasone (FLONASE) 50 MCG/ACT nasal spray USE 1 SPRAY IN EACH NOSTRIL DAILY.  Marland Kitchen levothyroxine (SYNTHROID, LEVOTHROID) 88 MCG tablet Take 1 tablet (88 mcg total) by mouth daily.  Marland Kitchen lovastatin (MEVACOR) 40 MG tablet TAKE (1) TABLET BY MOUTH AT BEDTIME.  . montelukast (SINGULAIR) 10 MG tablet TAKE (1) TABLET BY MOUTH AT BEDTIME.  . nabumetone (RELAFEN) 750 MG tablet TAKE ONE TABLET BY MOUTH DAILY.  . pantoprazole (PROTONIX) 20 MG tablet TAKE ONE TABLET BY MOUTH DAILY.  Marland Kitchen PARoxetine (PAXIL) 20 MG tablet Take 1 tablet (20 mg total) by mouth daily.  Marland Kitchen SALINE NASAL MIST NA Place 1-2 sprays into the nose daily as needed (for congestion).  . SYMBICORT 80-4.5 MCG/ACT inhaler INHALE 2 PUFFS INTO THE LUNGS TWICE DAILY.  Marland Kitchen triamterene-hydrochlorothiazide (MAXZIDE-25) 37.5-25 MG tablet TAKE 1 TABLET BY MOUTH ONCE A DAY.  . VENTOLIN HFA 108 (90 Base) MCG/ACT inhaler INHALE 2 PUFFS INTO THE LUNGS EVERY 4 HOURS AS NEEDED FOR COUGHNG ANDWHEEZING.   No facility-administered encounter medications on file as of 04/20/2018.     Activities of Daily Living In your present state of health, do you have any difficulty performing the following activities: 04/20/2018  Hearing? Y  Vision? Y  Difficulty concentrating or making decisions? N  Walking or climbing stairs? Y  Dressing or bathing? N  Doing errands, shopping? Y  Preparing Food and eating ? N  Using the Toilet? N  In the past six months, have you accidently leaked urine? N  Do you have problems with loss of bowel control? N  Managing your Medications? N  Managing your Finances? N    Housekeeping or managing your Housekeeping? N  Some recent data might be hidden    Patient Care Team: Fayrene Helper, MD as PCP - General Leta Baptist, MD as Consulting Physician (Otolaryngology) Carole Civil, MD as Consulting Physician (Orthopedic Surgery)    Assessment:   This is a  routine wellness examination for [name].  Exercise Activities and Dietary recommendations Current Exercise Habits: Home exercise routine, Type of exercise: walking, Time (Minutes): 15, Frequency (Times/Week): 3, Weekly Exercise (Minutes/Week): 45, Intensity: Mild  Goals    None      Fall Risk Fall Risk  04/20/2018 03/31/2018 09/26/2017 06/24/2017 12/30/2016  Falls in the past year? No No No No No  Risk for fall due to : - - - Impaired mobility;Impaired vision;Impaired balance/gait Impaired vision;Impaired balance/gait   Is the patient's home free of loose throw rugs in walkways, pet beds, electrical cords, etc?   yes      Grab bars in the bathroom? yes      Handrails on the stairs?   yes      Adequate lighting?   yes  Timed Get Up and Go performed:   Depression Screen PHQ 2/9 Scores 04/20/2018 03/31/2018 09/26/2017 06/24/2017  PHQ - 2 Score 0 0 0 0  PHQ- 9 Score - - - -     Cognitive Function     6CIT Screen 04/20/2018 12/30/2016  What Year? 0 points 0 points  What month? 0 points 0 points  What time? - 0 points  Count back from 20 0 points 0 points  Months in reverse 0 points 0 points  Repeat phrase 0 points 0 points  Total Score - 0    Immunization History  Administered Date(s) Administered  . H1N1 11/15/2008  . Influenza Split 08/30/2011, 08/14/2012  . Influenza Whole 09/15/2009, 09/26/2010  . Influenza,inj,Quad PF,6+ Mos 10/18/2013, 08/25/2014, 12/13/2015, 08/05/2016, 09/22/2017  . Pneumococcal Conjugate-13 05/03/2015  . Pneumococcal Polysaccharide-23 08/05/2016  . Td 03/02/2004  . Tdap 12/12/2011    Qualifies for Shingles Vaccine? Due   Screening Tests Health  Maintenance  Topic Date Due  . INFLUENZA VACCINE  07/09/2018  . MAMMOGRAM  06/27/2019  . COLONOSCOPY  05/12/2021  . TETANUS/TDAP  12/11/2021  . DEXA SCAN  Completed  . Hepatitis C Screening  Completed  . PNA vac Low Risk Adult  Completed    Cancer Screenings: Lung: Low Dose CT Chest recommended if Age 65-80 years, 30 pack-year currently smoking OR have quit w/in 15years. Patient does not qualify. Breast:  Up to date on Mammogram? Yes   Up to date of Bone Density/Dexa? Yes Colorectal: up tp date   Additional Screenings: : Hepatitis C Screening:      Plan:     I have personally reviewed and noted the following in the patient's chart:   . Medical and social history . Use of alcohol, tobacco or illicit drugs  . Current medications and supplements . Functional ability and status . Nutritional status . Physical activity . Advanced directives . List of other physicians . Hospitalizations, surgeries, and ER visits in previous 12 months . Vitals . Screenings to include cognitive, depression, and falls . Referrals and appointments  In addition, I have reviewed and discussed with patient certain preventive protocols, quality metrics, and best practice recommendations. A written personalized care plan for preventive services as well as general preventive health recommendations were provided to patient.     Kate Sable, LPN, LPN  7/62/[id number]

## 2018-05-20 ENCOUNTER — Other Ambulatory Visit: Payer: Self-pay | Admitting: Family Medicine

## 2018-05-29 ENCOUNTER — Other Ambulatory Visit: Payer: Self-pay | Admitting: Family Medicine

## 2018-06-17 ENCOUNTER — Other Ambulatory Visit: Payer: Self-pay | Admitting: Family Medicine

## 2018-06-30 ENCOUNTER — Other Ambulatory Visit: Payer: Self-pay | Admitting: Family Medicine

## 2018-07-03 ENCOUNTER — Encounter (HOSPITAL_COMMUNITY): Payer: Self-pay

## 2018-07-03 ENCOUNTER — Ambulatory Visit (HOSPITAL_COMMUNITY)
Admission: RE | Admit: 2018-07-03 | Discharge: 2018-07-03 | Disposition: A | Discharge status: Home / Self Care | Payer: Medicare Other | Source: Ambulatory Visit | Attending: Family Medicine | Admitting: Family Medicine

## 2018-07-03 DIAGNOSIS — Z1231 Encounter for screening mammogram for malignant neoplasm of breast: Secondary | ICD-10-CM

## 2018-07-09 ENCOUNTER — Other Ambulatory Visit: Payer: Self-pay | Admitting: Family Medicine

## 2018-07-10 ENCOUNTER — Other Ambulatory Visit: Payer: Self-pay | Admitting: Family Medicine

## 2018-07-22 ENCOUNTER — Other Ambulatory Visit: Payer: Self-pay | Admitting: Family Medicine

## 2018-07-24 ENCOUNTER — Other Ambulatory Visit: Payer: Self-pay | Admitting: Family Medicine

## 2018-07-28 ENCOUNTER — Encounter: Payer: Self-pay | Admitting: Family Medicine

## 2018-07-28 ENCOUNTER — Ambulatory Visit (INDEPENDENT_AMBULATORY_CARE_PROVIDER_SITE_OTHER): Payer: Medicare Other | Admitting: Family Medicine

## 2018-07-28 VITALS — BP 138/82 | HR 73 | Resp 16 | Ht 66.0 in | Wt 193.0 lb

## 2018-07-28 DIAGNOSIS — E559 Vitamin D deficiency, unspecified: Secondary | ICD-10-CM

## 2018-07-28 DIAGNOSIS — E038 Other specified hypothyroidism: Secondary | ICD-10-CM | POA: Diagnosis not present

## 2018-07-28 DIAGNOSIS — E785 Hyperlipidemia, unspecified: Secondary | ICD-10-CM

## 2018-07-28 DIAGNOSIS — R7301 Impaired fasting glucose: Secondary | ICD-10-CM | POA: Diagnosis not present

## 2018-07-28 DIAGNOSIS — E669 Obesity, unspecified: Secondary | ICD-10-CM | POA: Diagnosis not present

## 2018-07-28 DIAGNOSIS — F341 Dysthymic disorder: Secondary | ICD-10-CM | POA: Diagnosis not present

## 2018-07-28 DIAGNOSIS — Z23 Encounter for immunization: Secondary | ICD-10-CM

## 2018-07-28 DIAGNOSIS — I10 Essential (primary) hypertension: Secondary | ICD-10-CM

## 2018-07-28 MED ORDER — CYCLOBENZAPRINE HCL 5 MG PO TABS: ORAL_TABLET | ORAL | 3 refills | Status: DC

## 2018-07-28 NOTE — Patient Instructions (Addendum)
MD f/u early January, call if you need me sooner  Fasting lipid, cmp and eGFR, HBA1C and vit D to be drawn 5 to 7 days  before follow up, collect lab order at checkout [address])  Flexeril sent to pharmacy as muscle relaxant  Flu vaccine today  Handicap parking sticker today  We will send for last office note from [facility] and Dr Jorja Loa in [place] re eye exam before ai will be able to write note of certification that you are legally blind   Thank you  for choosing [facility]. We consider it a privelige to serve you.  Delivering excellent health care in a caring and  compassionate way is our goal.  Partnering with you,  so that together we can achieve this goal is our strategy.

## 2018-07-28 NOTE — Progress Notes (Signed)
[name]     MRN: [id number]      DOB: Jan 27, 1951   HPI [name] is here for follow up and re-evaluation of chronic medical conditions, medication management and review of any available recent lab and radiology data.  Preventive health is updated, specifically  Cancer screening and Immunization.   Questions or concerns regarding consultations or procedures which the PT has had in the interim are  addressed. The PT denies any adverse reactions to current medications since the last visit.  There are no new concerns.  There are no specific complaints   ROS Denies recent fever or chills. Denies sinus pressure, nasal congestion, ear pain or sore throat. Denies chest congestion, productive cough or wheezing. Denies chest pains, palpitations and leg swelling Denies abdominal pain, nausea, vomiting,diarrhea or constipation.   Denies dysuria, frequency, hesitancy or incontinence. Denies uncontrolled joint pain, swelling and limitation in mobility. Denies headaches, seizures, numbness, or tingling. Denies depression, anxiety or insomnia. Denies skin break down or rash.   PE  BP 138/82   Pulse 73   Resp 16   Ht 5\' 6"  (1.676 m)   Wt 193 lb (87.5 kg)   SpO2 98%   BMI 31.15 kg/m   Patient alert and oriented and in no cardiopulmonary distress.  HEENT: No facial asymmetry, EOMI,   oropharynx pink and moist.  Neck supple no JVD, no mass.  Chest: Clear to auscultation bilaterally.  CVS: S1, S2 no murmurs, no S3.Regular rate.  ABD: Soft non tender.   Ext: No edema  MS: Decreased ROM spine, shoulders, hips and knees.  Skin: Intact, no ulcerations or rash noted.  Psych: Good eye contact, normal affect. Memory intact not anxious or depressed appearing.  CNS: CN 5-12 intact, power,  normal throughout.no focal deficits noted.Marked loss of vision   Assessment & Plan  Essential hypertension Controlled, no change in medication DASH diet and commitment to daily  physical activity for a minimum of 30 minutes discussed and encouraged, as a part of hypertension management. The importance of attaining a healthy weight is also discussed.  BP/Weight 07/28/2018 04/20/2018 04/15/2018 03/31/2018 12/11/2017 09/26/2017 56/31/4970  Systolic BP 263 785 885 027 741 287 867  Diastolic BP 82 78 82 74 70 83 82  Wt. (Lbs) 193 195 192 195 202 199 199.25  BMI 31.15 31.47 30.99 31.47 32.6 32.12 32.16       Hypothyroidism Controlled, no change in medication Updated lab needed at/ before next visit.   Obesity (BMI 30.0-34.9) Unchanged Patient re-educated about  the importance of commitment to a  minimum of 150 minutes of exercise per week.  The importance of healthy food choices with portion control discussed. Encouraged to start a food diary, count calories and to consider  joining a support group. Sample diet sheets offered. Goals set by the patient for the next several months.   Weight /BMI 07/28/2018 04/20/2018 04/15/2018  WEIGHT 193 lb 195 lb 192 lb  HEIGHT 5\' 6"  5\' 6"  5\' 6"   BMI 31.15 kg/m2 31.47 kg/m2 30.99 kg/m2      Prediabetes Patient educated about the importance of limiting  Carbohydrate intake , the need to commit to daily physical activity for a minimum of 30 minutes , and to commit weight loss. The fact that changes in all these areas will reduce or eliminate all together the development of diabetes is stressed.  Deteriorated Updated lab needed at/ before next visit.   Diabetic Labs Latest Ref Rng & Units 04/15/2018 09/22/2017  04/21/2017 08/05/2016 12/13/2015  HbA1c <5.7 % of total Hgb 5.8(H) - 5.7(H) 5.7(H) 5.9(H)  Chol <200 mg/dL 200(H) 195 186 181 205(H)  HDL >50 mg/dL 73 84 83 65 84  Calc LDL mg/dL (calc) 110(H) 94 90 96 96  Triglycerides <150 mg/dL 78 82 66 98 123  Creatinine 0.50 - 0.99 mg/dL 0.84 0.90 0.97 0.85 0.76   BP/Weight 07/28/2018 04/20/2018 04/15/2018 03/31/2018 12/11/2017 09/26/2017 70/12/7492  Systolic BP 496 759 163 846 120 659 935    Diastolic BP 82 78 82 74 70 83 82  Wt. (Lbs) 193 195 192 195 202 199 199.25  BMI 31.15 31.47 30.99 31.47 32.6 32.12 32.16   No flowsheet data found.    Depression Controlled, no change in medication

## 2018-07-30 ENCOUNTER — Other Ambulatory Visit: Payer: Self-pay | Admitting: Family Medicine

## 2018-08-01 ENCOUNTER — Encounter: Payer: Self-pay | Admitting: Family Medicine

## 2018-08-01 NOTE — Assessment & Plan Note (Signed)
Controlled, no change in medication  

## 2018-08-01 NOTE — Assessment & Plan Note (Signed)
Controlled, no change in medication Updated lab needed at/ before next visit.  

## 2018-08-01 NOTE — Assessment & Plan Note (Signed)
Patient educated about the importance of limiting  Carbohydrate intake , the need to commit to daily physical activity for a minimum of 30 minutes , and to commit weight loss. The fact that changes in all these areas will reduce or eliminate all together the development of diabetes is stressed.  Deteriorated Updated lab needed at/ before next visit.   Diabetic Labs Latest Ref Rng & Units 04/15/2018 09/22/2017 04/21/2017 08/05/2016 12/13/2015  HbA1c <5.7 % of total Hgb 5.8(H) - 5.7(H) 5.7(H) 5.9(H)  Chol <200 mg/dL 200(H) 195 186 181 205(H)  HDL >50 mg/dL 73 84 83 65 84  Calc LDL mg/dL (calc) 110(H) 94 90 96 96  Triglycerides <150 mg/dL 78 82 66 98 123  Creatinine 0.50 - 0.99 mg/dL 0.84 0.90 0.97 0.85 0.76   BP/Weight 07/28/2018 04/20/2018 04/15/2018 03/31/2018 12/11/2017 09/26/2017 89/38/1017  Systolic BP 510 258 527 782 423 536 144  Diastolic BP 82 78 82 74 70 83 82  Wt. (Lbs) 193 195 192 195 202 199 199.25  BMI 31.15 31.47 30.99 31.47 32.6 32.12 32.16   No flowsheet data found.

## 2018-08-01 NOTE — Assessment & Plan Note (Signed)
Controlled, no change in medication DASH diet and commitment to daily physical activity for a minimum of 30 minutes discussed and encouraged, as a part of hypertension management. The importance of attaining a healthy weight is also discussed.  BP/Weight 07/28/2018 04/20/2018 04/15/2018 03/31/2018 12/11/2017 09/26/2017 08/67/6195  Systolic BP 093 267 124 580 998 338 250  Diastolic BP 82 78 82 74 70 83 82  Wt. (Lbs) 193 195 192 195 202 199 199.25  BMI 31.15 31.47 30.99 31.47 32.6 32.12 32.16

## 2018-08-01 NOTE — Assessment & Plan Note (Signed)
Unchanged Patient re-educated about  the importance of commitment to a  minimum of 150 minutes of exercise per week.  The importance of healthy food choices with portion control discussed. Encouraged to start a food diary, count calories and to consider  joining a support group. Sample diet sheets offered. Goals set by the patient for the next several months.   Weight /BMI 07/28/2018 04/20/2018 04/15/2018  WEIGHT 193 lb 195 lb 192 lb  HEIGHT 5\' 6"  5\' 6"  5\' 6"   BMI 31.15 kg/m2 31.47 kg/m2 30.99 kg/m2

## 2018-08-17 ENCOUNTER — Other Ambulatory Visit: Payer: Self-pay | Admitting: Family Medicine

## 2018-08-26 ENCOUNTER — Other Ambulatory Visit: Payer: Self-pay | Admitting: Family Medicine

## 2018-09-22 DIAGNOSIS — E039 Hypothyroidism, unspecified: Secondary | ICD-10-CM | POA: Diagnosis not present

## 2018-09-22 LAB — TSH: TSH: 3.47 mIU/L (ref 0.40–4.50)

## 2018-09-22 LAB — T4, FREE: Free T4: 1.2 ng/dL (ref 0.8–1.8)

## 2018-09-30 ENCOUNTER — Encounter: Payer: Self-pay | Admitting: "Endocrinology

## 2018-09-30 ENCOUNTER — Ambulatory Visit (INDEPENDENT_AMBULATORY_CARE_PROVIDER_SITE_OTHER): Payer: Medicare Other | Admitting: "Endocrinology

## 2018-09-30 VITALS — BP 136/80 | HR 70 | Ht 66.0 in | Wt 194.0 lb

## 2018-09-30 DIAGNOSIS — E039 Hypothyroidism, unspecified: Secondary | ICD-10-CM

## 2018-09-30 DIAGNOSIS — E049 Nontoxic goiter, unspecified: Secondary | ICD-10-CM

## 2018-09-30 MED ORDER — LEVOTHYROXINE SODIUM 100 MCG PO TABS: 100.0000 ug | ORAL_TABLET | Freq: Every day | ORAL | 6 refills | Status: DC

## 2018-09-30 NOTE — Progress Notes (Signed)
Endocrinology follow-up note   Subjective:    Patient ID: [name], female    DOB: 1951-04-15, PCP Fayrene Helper, MD   Past Medical History:  Diagnosis Date  . Allergy   . Anxiety   . Arthritis   . Asthma   . Hyperlipidemia   . Hypertension   . Vision abnormalities    states close to blindness due to cataracts   Past Surgical History:  Procedure Laterality Date  . caesarean    . cataracts    . CESAREAN SECTION    . TOTAL ABDOMINAL HYSTERECTOMY     prolapse   Social History   Socioeconomic History  . Marital status: Single    Spouse name: Not on file  . Number of children: Not on file  . Years of education: 12th grade  . Highest education level: Not on file  Occupational History  . Occupation: disabled    Fish farm manager: NOT EMPLOYED  Social Needs  . Financial resource strain: Not hard at all  . Food insecurity:    Worry: Never true    Inability: Never true  . Transportation needs:    Medical: No    Non-medical: No  Tobacco Use  . Smoking status: Former Smoker    Packs/day: 0.50    Years: 18.00    Pack years: 9.00    Last attempt to quit: 1989    Years since quitting: 30.8  . Smokeless tobacco: Never Used  Substance and Sexual Activity  . Alcohol use: No  . Drug use: No  . Sexual activity: Not Currently  Lifestyle  . Physical activity:    Days per week: 0 days    Minutes per session: 0 min  . Stress: Not at all  Relationships  . Social connections:    Talks on phone: Once a week    Gets together: Once a week    Attends religious service: More than 4 times per year    Active member of club or organization: Yes    Attends meetings of clubs or organizations: More than 4 times per year    Relationship status: Never married  Other Topics Concern  . Not on file  Social History Narrative  . Not on file   Outpatient Encounter Medications as of 09/30/2018  Medication Sig  . acetaminophen (TYLENOL) 650 MG CR tablet Take 650 mg by mouth as  needed for pain.  Marland Kitchen amLODipine (NORVASC) 10 MG tablet TAKE 1 TABLET BY MOUTH AT BEDTIME.  . Calcium Carb-Cholecalciferol (CALCIUM 500 +D) 500-400 MG-UNIT TABS Take tab twice daily (Patient taking differently: Take 1 tablet by mouth 2 (two) times daily. Take tab twice daily)  . cetirizine (ZYRTEC) 10 MG tablet Take 1 tablet (10 mg total) by mouth daily.  . cyclobenzaprine (FLEXERIL) 5 MG tablet Take one tablet at bedtime, as needed, for neck pain and spsm  . ergocalciferol (VITAMIN D2) 50000 units capsule Take 1 capsule (50,000 Units total) by mouth once a week. One capsule once weekly  . fluticasone (FLONASE) 50 MCG/ACT nasal spray USE 1 SPRAY IN EACH NOSTRIL DAILY.  . fluticasone (FLONASE) 50 MCG/ACT nasal spray USE 1 SPRAY IN EACH NOSTRIL DAILY.  Marland Kitchen levothyroxine (SYNTHROID, LEVOTHROID) 100 MCG tablet Take 1 tablet (100 mcg total) by mouth daily.  Marland Kitchen lovastatin (MEVACOR) 40 MG tablet TAKE (1) TABLET BY MOUTH AT BEDTIME.  . montelukast (SINGULAIR) 10 MG tablet TAKE (1) TABLET BY MOUTH AT BEDTIME.  . nabumetone (RELAFEN) 750 MG tablet TAKE  ONE TABLET BY MOUTH DAILY.  . pantoprazole (PROTONIX) 20 MG tablet TAKE ONE TABLET BY MOUTH DAILY.  Marland Kitchen PARoxetine (PAXIL) 20 MG tablet TAKE ONE TABLET BY MOUTH DAILY.  . SYMBICORT 80-4.5 MCG/ACT inhaler INHALE 2 PUFFS INTO THE LUNGS TWICE DAILY.  Marland Kitchen triamterene-hydrochlorothiazide (MAXZIDE-25) 37.5-25 MG tablet TAKE 1 TABLET BY MOUTH ONCE A DAY.  . VENTOLIN HFA 108 (90 Base) MCG/ACT inhaler INHALE 2 PUFFS INTO THE LUNGS EVERY 4 HOURS AS NEEDED FOR COUGHNG ANDWHEEZING.  . [DISCONTINUED] levothyroxine (SYNTHROID, LEVOTHROID) 88 MCG tablet Take 1 tablet (88 mcg total) by mouth daily.   No facility-administered encounter medications on file as of 09/30/2018.    ALLERGIES: Allergies  Allergen Reactions  . Lotensin Hct [Benazepril-Hydrochlorothiazide] Hives    VACCINATION STATUS: Immunization History  Administered Date(s) Administered  . H1N1 11/15/2008  .  Influenza Split 08/30/2011, 08/14/2012  . Influenza Whole 09/15/2009, 09/26/2010  . Influenza,inj,Quad PF,6+ Mos 10/18/2013, 08/25/2014, 12/13/2015, 08/05/2016, 09/22/2017, 07/28/2018  . Pneumococcal Conjugate-13 05/03/2015  . Pneumococcal Polysaccharide-23 08/05/2016  . Td 03/02/2004  . Tdap 12/12/2011    HPI 67 year old female patient with medical history as above.  She is returning with repeat thyroid function tests for follow-up of hypothyroidism.    - She is currently on levothyroxine 88 mcg p.o. nightly.  She reports good compliance with this medication.  She is taking it orally in the morning on empty stomach with water. She started to feel better.   -She has a steady weight. - She is a poor historian accompanied by her sister.  - She denies dysphagia, shortness of breath, nor voice change. She had  a series of thyroid ultrasound studies, last one from 05/02/2017 showing 1 cm solitary nodule on the left lobe of the thyroid . - Ultrasound description of the thyroid summarized below. 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  Current Outpatient Medications:  .  acetaminophen (TYLENOL) 650 MG CR tablet, Take 650 mg by mouth as needed for pain., Disp: , Rfl:  .  amLODipine (NORVASC) 10 MG tablet, TAKE 1 TABLET BY MOUTH AT BEDTIME., Disp: 90 tablet, Rfl: 0 .  Calcium Carb-Cholecalciferol (CALCIUM 500 +D) 500-400 MG-UNIT TABS, Take tab twice daily (Patient taking differently: Take 1 tablet by mouth 2 (two) times daily. Take tab twice daily), Disp: 60 tablet, Rfl: 11 .  cetirizine (ZYRTEC) 10 MG tablet, Take 1 tablet (10 mg total) by mouth daily., Disp: 90 tablet, Rfl: 1 .  cyclobenzaprine (FLEXERIL) 5 MG tablet, Take one tablet at bedtime, as needed, for neck pain and spsm, Disp: 30 tablet, Rfl: 3 .  ergocalciferol (VITAMIN D2) 50000 units capsule, Take 1 capsule (50,000 Units total) by mouth once a week. One capsule once weekly,  Disp: 12 capsule, Rfl: 3 .  fluticasone (FLONASE) 50 MCG/ACT nasal spray, USE 1 SPRAY IN EACH NOSTRIL DAILY., Disp: 16 g, Rfl: 0 .  fluticasone (FLONASE) 50 MCG/ACT nasal spray, USE 1 SPRAY IN EACH NOSTRIL DAILY., Disp: 16 g, Rfl: 0 .  levothyroxine (SYNTHROID, LEVOTHROID) 100 MCG tablet, Take 1 tablet (100 mcg total) by mouth daily., Disp: 30 tablet, Rfl: 6 .  lovastatin (MEVACOR) 40 MG tablet, TAKE (1) TABLET BY MOUTH AT BEDTIME., Disp: 90 tablet, Rfl: 1 .  montelukast (SINGULAIR) 10 MG tablet, TAKE (1) TABLET BY MOUTH AT BEDTIME., Disp: 90 tablet, Rfl: 0 .  nabumetone (RELAFEN) 750 MG tablet, TAKE ONE TABLET BY MOUTH DAILY., Disp: 90 tablet, Rfl: 0 .  pantoprazole (  PROTONIX) 20 MG tablet, TAKE ONE TABLET BY MOUTH DAILY., Disp: 30 tablet, Rfl: 3 .  PARoxetine (PAXIL) 20 MG tablet, TAKE ONE TABLET BY MOUTH DAILY., Disp: 90 tablet, Rfl: 3 .  SYMBICORT 80-4.5 MCG/ACT inhaler, INHALE 2 PUFFS INTO THE LUNGS TWICE DAILY., Disp: 10.2 g, Rfl: 2 .  triamterene-hydrochlorothiazide (MAXZIDE-25) 37.5-25 MG tablet, TAKE 1 TABLET BY MOUTH ONCE A DAY., Disp: 30 tablet, Rfl: 3 .  VENTOLIN HFA 108 (90 Base) MCG/ACT inhaler, INHALE 2 PUFFS INTO THE LUNGS EVERY 4 HOURS AS NEEDED FOR COUGHNG ANDWHEEZING., Disp: 18 g, Rfl: 0  Past Medical History:  Diagnosis Date  . Allergy   . Anxiety   . Arthritis   . Asthma   . Hyperlipidemia   . Hypertension   . Vision abnormalities    states close to blindness due to cataracts   Past Surgical History:  Procedure Laterality Date  . caesarean    . cataracts    . CESAREAN SECTION    . TOTAL ABDOMINAL HYSTERECTOMY     prolapse   Review of Systems  Constitutional:  + Steady weight,  - fatigue, no subjective hyperthermia, no subjective hypothermia Eyes: no blurry vision, no xerophthalmia ENT: + Severe visual impairment due to corneal defect, no sore throat, no nodules palpated in throat, no dysphagia/odynophagia, no hoarseness Cardiovascular: no Chest Pain, no  Shortness of Breath, no palpitations, no leg swelling Musculoskeletal: no muscle/joint aches Skin: no rashes Neurological: no tremors, no numbness, no tingling, no dizziness Psychiatric: no depression, no anxiety  Objective:    BP 136/80   Pulse 70   Wt 194 lb (88 kg)   BMI 31.31 kg/m   Wt Readings from Last 3 Encounters:  09/30/18 194 lb (88 kg)  07/28/18 193 lb (87.5 kg)  04/20/18 195 lb (88.5 kg)    Physical Exam  Constitutional:  + Obese, not in acute distress, normal state of mind.  uses her walking cane due to visual impairment. Eyes: PERRLA, EOMI, no exophthalmos ENT: moist mucous membranes, no thyromegaly, no cervical lymphadenopathy  Musculoskeletal: no gross deformities, strength intact in all four extremities Skin: moist, warm, no rashes Neurological: no tremor with outstretched hands, Deep tendon reflexes normal in all four extremities.  CMP ( most recent) CMP     Component Value Date/Time   NA 142 04/15/2018 1226   K 3.9 04/15/2018 1226   CL 104 04/15/2018 1226   CO2 29 04/15/2018 1226   GLUCOSE 88 04/15/2018 1226   BUN 24 04/15/2018 1226   CREATININE 0.84 04/15/2018 1226   CALCIUM 9.9 04/15/2018 1226   PROT 7.8 04/15/2018 1226   ALBUMIN 3.9 04/21/2017 0846   AST 22 04/15/2018 1226   ALT 22 04/15/2018 1226   ALKPHOS 49 04/21/2017 0846   BILITOT 0.4 04/15/2018 1226   GFRNONAA 72 04/15/2018 1226   GFRAA 83 04/15/2018 1226     Diabetic Labs (most recent): Lab Results  Component Value Date   HGBA1C 5.8 (H) 04/15/2018   HGBA1C 5.7 (H) 04/21/2017   HGBA1C 5.7 (H) 08/05/2016     Lipid Panel ( most recent) Lipid Panel     Component Value Date/Time   CHOL 200 (H) 04/15/2018 1226   TRIG 78 04/15/2018 1226   HDL 73 04/15/2018 1226   CHOLHDL 2.7 04/15/2018 1226   VLDL 13 04/21/2017 0846   LDLCALC 110 (H) 04/15/2018 1226     Last ultrasound of the thyroid from 05/02/2017 showed right lobe 4.2 cm previously 4.2 cm, left lobe  3.2 cm previously 4.3  cm. There is a 1 cm nodule on the lower part of the left lobe of the thyroid. This nodule was reported to be stable from prior studies.  Results for CHAQUANA, NICHOLS (MRN [id number]) as of 09/30/2018 12:03  Ref. Range 09/22/2017 12:20 03/20/2018 11:39 09/22/2018 09:48  TSH Latest Ref Range: 0.40 - 4.50 mIU/L 3.84 4.35 3.47  T4,Free(Direct) Latest Ref Range: 0.8 - 1.8 ng/dL 1.3 1.3 1.2  Thyroglobulin Ab Latest Ref Range: < or = 1 IU/mL <1    Thyroperoxidase Ab SerPl-aCnc Latest Ref Range: <9 IU/mL 869 (H)       Assessment & Plan:   1. Hypothyroidism due to Hashimoto's thyroiditis  - She has done very well on levothyroxine. Based on her labs, she will benefit from slight increase in her levothyroxine dose.   -I discussed and increased her levothyroxine to 100 mcg p.o. every morning.    - We discussed about correct intake of levothyroxine, at fasting, with water, separated by at least 30 minutes from breakfast, and separated by more than 4 hours from calcium, iron, multivitamins, acid reflux medications (PPIs). -Patient is made aware of the fact that thyroid hormone replacement is needed for life, dose to be adjusted by periodic monitoring of thyroid function tests.   2. Nodular goiter-  - In the EMR records, she has a series of thyroid/neck sonograms, last on in May 2018,  showing stable solitary left lobe nodule at 1 cm in maximum dimension with no suspicious features. She will not require intervention at this time. - She may need repeat physical exam and ultrasound if necessary in 1 year.   - I advised patient to maintain close follow up with Fayrene Helper, MD for primary care needs. Follow up plan: Return in about 6 months (around 04/01/2019) for Follow up with Pre-visit Labs.  Glade Lloyd, MD Phone: [phone]  Fax: [phone]  -  This note was partially dictated with voice recognition software. Similar sounding words can be transcribed inadequately or may not  be corrected  upon review.  09/30/2018, 12:01 PM

## 2018-10-19 ENCOUNTER — Other Ambulatory Visit: Payer: Self-pay

## 2018-10-19 MED ORDER — TRIAMTERENE-HCTZ 37.5-25 MG PO TABS: 1.0000 | ORAL_TABLET | Freq: Every day | ORAL | 3 refills | Status: DC

## 2018-11-07 ENCOUNTER — Other Ambulatory Visit: Payer: Self-pay | Admitting: Family Medicine

## 2018-11-16 ENCOUNTER — Other Ambulatory Visit: Payer: Self-pay | Admitting: Family Medicine

## 2018-12-15 ENCOUNTER — Encounter: Payer: Self-pay | Admitting: Family Medicine

## 2018-12-15 ENCOUNTER — Other Ambulatory Visit (HOSPITAL_COMMUNITY)
Admission: RE | Admit: 2018-12-15 | Discharge: 2018-12-15 | Disposition: A | Discharge status: Home / Self Care | Payer: Medicare Other | Source: Ambulatory Visit | Attending: Family Medicine | Admitting: Family Medicine

## 2018-12-15 ENCOUNTER — Ambulatory Visit (INDEPENDENT_AMBULATORY_CARE_PROVIDER_SITE_OTHER): Payer: Medicare Other | Admitting: Family Medicine

## 2018-12-15 VITALS — BP 124/80 | HR 97 | Resp 15 | Ht 66.0 in | Wt 200.0 lb

## 2018-12-15 DIAGNOSIS — E559 Vitamin D deficiency, unspecified: Secondary | ICD-10-CM | POA: Diagnosis not present

## 2018-12-15 DIAGNOSIS — M25551 Pain in right hip: Secondary | ICD-10-CM | POA: Diagnosis not present

## 2018-12-15 DIAGNOSIS — R8290 Unspecified abnormal findings in urine: Secondary | ICD-10-CM | POA: Diagnosis not present

## 2018-12-15 DIAGNOSIS — A6004 Herpesviral vulvovaginitis: Secondary | ICD-10-CM

## 2018-12-15 DIAGNOSIS — I10 Essential (primary) hypertension: Secondary | ICD-10-CM | POA: Diagnosis not present

## 2018-12-15 DIAGNOSIS — N760 Acute vaginitis: Secondary | ICD-10-CM | POA: Insufficient documentation

## 2018-12-15 DIAGNOSIS — L308 Other specified dermatitis: Secondary | ICD-10-CM | POA: Diagnosis not present

## 2018-12-15 DIAGNOSIS — E785 Hyperlipidemia, unspecified: Secondary | ICD-10-CM

## 2018-12-15 DIAGNOSIS — R7301 Impaired fasting glucose: Secondary | ICD-10-CM | POA: Diagnosis not present

## 2018-12-15 DIAGNOSIS — F341 Dysthymic disorder: Secondary | ICD-10-CM | POA: Diagnosis not present

## 2018-12-15 DIAGNOSIS — J45991 Cough variant asthma: Secondary | ICD-10-CM

## 2018-12-15 MED ORDER — KETOROLAC TROMETHAMINE 60 MG/2ML IM SOLN
60.0000 mg | Freq: Once | INTRAMUSCULAR | Status: AC
  Administered 2018-12-15: 60 mg via INTRAMUSCULAR

## 2018-12-15 MED ORDER — BETAMETHASONE DIPROPIONATE 0.05 % EX CREA: TOPICAL_CREAM | Freq: Two times a day (BID) | CUTANEOUS | 0 refills | Status: DC

## 2018-12-15 MED ORDER — PREDNISONE 5 MG (21) PO TBPK: 5.0000 mg | ORAL_TABLET | ORAL | 0 refills | Status: DC

## 2018-12-15 MED ORDER — METHYLPREDNISOLONE ACETATE 80 MG/ML IJ SUSP
80.0000 mg | Freq: Once | INTRAMUSCULAR | Status: AC
  Administered 2018-12-15: 80 mg via INTRAMUSCULAR

## 2018-12-15 NOTE — Patient Instructions (Addendum)
Wellness with nurse May 14 or after  MD follow up in 6 months.Call if you need me before  Labs today that were ordered in August, please remove the vit D lab order, pls add HSV2   Injections ( 2) today for right hip pain and prednisone also prescribed  Urine to be sent for testing for infection due to complaint of odor and itching  Please get shingles vaccines at your pharmacy, start in February , it is 2 vaccines

## 2018-12-15 NOTE — Assessment & Plan Note (Signed)
Uncontrolled.Toradol and depo medrol administered IM in the office , to be followed by a short course of oral prednisone and NSAIDS.  

## 2018-12-15 NOTE — Progress Notes (Signed)
   [name]     MRN: [id number]      DOB: [birth year]   HPI [name] is here for follow up and re-evaluation of chronic medical conditions, medication management and review of any available recent lab and radiology data.  Preventive health is updated, specifically  Cancer screening and Immunization.   Questions or concerns regarding consultations or procedures which the PT has had in the interim are  addressed. The PT denies any adverse reactions to current medications since the last visit.  2 month h/o vaginal itch and smelly discharge Dysuria  And frequency esp at night x 1 to 2 months  Rash on left elbow x 3 weeks, itces, no redness or drainage  ROS Denies recent fever or chills. Denies sinus pressure, nasal congestion, ear pain or sore throat. Denies chest congestion, productive cough or wheezing. Denies chest pains, palpitations and leg swelling Denies abdominal pain, nausea, vomiting,diarrhea or constipation.   Denies headaches, seizures, numbness, or tingling. Denies uncontrolled depression, anxiety or insomnia.    PE  BP 124/80   Pulse 97   Resp 15   Ht 5\' 6"  (1.676 m)   Wt 200 lb (90.7 kg)   SpO2 97%   BMI 32.28 kg/m   Patient alert and oriented and in no cardiopulmonary distress.  HEENT: No facial asymmetry, EOMI,   oropharynx pink and moist.  Neck supple no JVD, no mass.  Chest: Clear to auscultation bilaterally.  CVS: S1, S2 no murmurs, no S3.Regular rate.  ABD: Soft non tender.   Ext: No edema  MS: Decreased  ROM spine, , hips and normal in  Knees.and shoulders  Skin: Intact, eczematous rash noted    Psych: Good eye contact, normal affect. Memory intact not anxious or depressed appearing.  CNS: CN 2-12 intact, power,  normal throughout.no focal deficits noted.   Assessment & Plan  HIP PAIN, RIGHT Uncontrolled.Toradol and depo medrol administered IM in the office , to be followed by a short course of oral prednisone and  NSAIDS.   Essential hypertension Controlled, no change in medication DASH diet and commitment to daily physical activity for a minimum of 30 minutes discussed and encouraged, as a part of hypertension management. The importance of attaining a healthy weight is also discussed.  BP/Weight 12/15/2018 09/30/2018 07/28/2018 04/20/2018 04/15/2018 02/29/5572 01/10/253  Systolic BP 270 623 762 831 517 616 073  Diastolic BP 80 80 82 78 82 74 70  Wt. 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   Lipid Panel  Lab Results  Component Value Date   CHOL 220 (H) 12/15/2018   HDL 75 12/15/2018   LDLCALC 124 (H) 12/15/2018   TRIG 107 12/15/2018   CHOLHDL 2.9 12/15/2018   Uncontrolled, needs to reduce fried and fatty foods    Asthma, cough variant Currently stable, no current flare

## 2018-12-16 ENCOUNTER — Other Ambulatory Visit: Payer: Self-pay | Admitting: Family Medicine

## 2018-12-16 LAB — LIPID PANEL
Cholesterol: 220 mg/dL — ABNORMAL HIGH (ref <200)
HDL: 75 mg/dL (ref >50)
LDL Cholesterol (Calc): 124 mg/dL (calc) — ABNORMAL HIGH
Non-HDL Cholesterol (Calc): 145 mg/dL (calc) — ABNORMAL HIGH (ref <130)
Total CHOL/HDL Ratio: 2.9 (calc) (ref <5.0)
Triglycerides: 107 mg/dL (ref <150)

## 2018-12-16 LAB — COMPLETE METABOLIC PANEL WITH GFR
AG Ratio: 1.4 (calc) (ref 1.0–2.5)
ALT: 24 U/L (ref 6–29)
AST: 19 U/L (ref 10–35)
Albumin: 4.1 g/dL (ref 3.6–5.1)
Alkaline phosphatase (APISO): 56 U/L (ref 33–130)
BUN: 18 mg/dL (ref 7–25)
CO2: 26 mmol/L (ref 20–32)
Calcium: 9.8 mg/dL (ref 8.6–10.4)
Chloride: 108 mmol/L (ref 98–110)
Creat: 0.72 mg/dL (ref 0.50–0.99)
GFR, Est African American: 100 mL/min/{1.73_m2} (ref >=60)
GFR, Est Non African American: 86 mL/min/{1.73_m2} (ref >=60)
Globulin: 2.9 g/dL (calc) (ref 1.9–3.7)
Glucose, Bld: 92 mg/dL (ref 65–99)
Potassium: 4.1 mmol/L (ref 3.5–5.3)
Sodium: 145 mmol/L (ref 135–146)
Total Bilirubin: 0.4 mg/dL (ref 0.2–1.2)
Total Protein: 7 g/dL (ref 6.1–8.1)

## 2018-12-16 LAB — HSV 2 ANTIBODY, IGG: HSV 2 Glycoprotein G Ab, IgG: 7.03 index — ABNORMAL HIGH

## 2018-12-16 LAB — URINE CYTOLOGY ANCILLARY ONLY
Chlamydia: NEGATIVE
Neisseria Gonorrhea: NEGATIVE
Trichomonas: NEGATIVE

## 2018-12-16 LAB — HEMOGLOBIN A1C
Hgb A1c MFr Bld: 5.8 % of total Hgb — ABNORMAL HIGH (ref <5.7)
Mean Plasma Glucose: 120 (calc)
eAG (mmol/L): 6.6 (calc)

## 2018-12-16 MED ORDER — ACYCLOVIR 400 MG PO TABS: 400.0000 mg | ORAL_TABLET | Freq: Every day | ORAL | 0 refills | Status: AC

## 2018-12-16 NOTE — Progress Notes (Signed)
ACYCLOVIR

## 2018-12-18 LAB — URINE CYTOLOGY ANCILLARY ONLY
Bacterial vaginitis: NEGATIVE
Candida vaginitis: NEGATIVE

## 2019-01-03 ENCOUNTER — Encounter: Payer: Self-pay | Admitting: Family Medicine

## 2019-01-03 DIAGNOSIS — A6004 Herpesviral vulvovaginitis: Secondary | ICD-10-CM | POA: Insufficient documentation

## 2019-01-03 DIAGNOSIS — R8290 Unspecified abnormal findings in urine: Secondary | ICD-10-CM | POA: Insufficient documentation

## 2019-01-03 NOTE — Assessment & Plan Note (Signed)
Controlled, no change in medication DASH diet and commitment to daily physical activity for a minimum of 30 minutes discussed and encouraged, as a part of hypertension management. The importance of attaining a healthy weight is also discussed.  BP/Weight 12/15/2018 09/30/2018 07/28/2018 04/20/2018 04/15/2018 4/51/4604 06/17/9871  Systolic BP 158 727 618 485 927 639 432  Diastolic BP 80 80 82 78 82 74 70  Wt. (Lbs) 200 194 193 195 192 195 202  BMI 32.28 31.31 31.15 31.47 30.99 31.47 32.6

## 2019-01-03 NOTE — Assessment & Plan Note (Signed)
Hyperlipidemia:Low fat diet discussed and encouraged.   Lipid Panel  Lab Results  Component Value Date   CHOL 220 (H) 12/15/2018   HDL 75 12/15/2018   LDLCALC 124 (H) 12/15/2018   TRIG 107 12/15/2018   CHOLHDL 2.9 12/15/2018   Uncontrolled, needs to reduce fried and fatty foods

## 2019-01-03 NOTE — Assessment & Plan Note (Signed)
Acute flare, steroid creams prescribed

## 2019-01-03 NOTE — Assessment & Plan Note (Signed)
Current flare, anti viral prescribed

## 2019-01-03 NOTE — Assessment & Plan Note (Signed)
Currently stable, no current flare

## 2019-01-03 NOTE — Assessment & Plan Note (Signed)
Controlled, no change in medication  

## 2019-01-04 ENCOUNTER — Other Ambulatory Visit: Payer: Self-pay | Admitting: Family Medicine

## 2019-01-18 DIAGNOSIS — H179 Unspecified corneal scar and opacity: Secondary | ICD-10-CM | POA: Diagnosis not present

## 2019-01-18 DIAGNOSIS — H2703 Aphakia, bilateral: Secondary | ICD-10-CM | POA: Diagnosis not present

## 2019-01-28 ENCOUNTER — Other Ambulatory Visit: Payer: Self-pay | Admitting: Family Medicine

## 2019-02-04 DIAGNOSIS — H179 Unspecified corneal scar and opacity: Secondary | ICD-10-CM | POA: Insufficient documentation

## 2019-02-04 DIAGNOSIS — F419 Anxiety disorder, unspecified: Secondary | ICD-10-CM | POA: Insufficient documentation

## 2019-02-11 ENCOUNTER — Other Ambulatory Visit: Payer: Self-pay | Admitting: Family Medicine

## 2019-03-09 ENCOUNTER — Other Ambulatory Visit: Payer: Self-pay

## 2019-03-09 MED ORDER — BUDESONIDE-FORMOTEROL FUMARATE 80-4.5 MCG/ACT IN AERO: 2.0000 | INHALATION_SPRAY | Freq: Two times a day (BID) | RESPIRATORY_TRACT | 3 refills | Status: DC

## 2019-03-12 ENCOUNTER — Other Ambulatory Visit: Payer: Self-pay | Admitting: Family Medicine

## 2019-03-24 ENCOUNTER — Encounter: Payer: Self-pay | Admitting: *Deleted

## 2019-04-01 ENCOUNTER — Ambulatory Visit: Payer: Medicare Other | Admitting: "Endocrinology

## 2019-04-09 ENCOUNTER — Other Ambulatory Visit: Payer: Self-pay | Admitting: Family Medicine

## 2019-04-20 ENCOUNTER — Other Ambulatory Visit: Payer: Self-pay | Admitting: Family Medicine

## 2019-04-20 ENCOUNTER — Other Ambulatory Visit: Payer: Self-pay | Admitting: "Endocrinology

## 2019-04-22 ENCOUNTER — Other Ambulatory Visit: Payer: Self-pay

## 2019-04-22 ENCOUNTER — Ambulatory Visit (INDEPENDENT_AMBULATORY_CARE_PROVIDER_SITE_OTHER): Payer: Medicare Other | Admitting: Family Medicine

## 2019-04-22 ENCOUNTER — Ambulatory Visit: Payer: Medicare Other | Admitting: Family Medicine

## 2019-04-22 ENCOUNTER — Encounter: Payer: Self-pay | Admitting: Family Medicine

## 2019-04-22 VITALS — BP 124/80 | Ht 66.0 in | Wt 194.0 lb

## 2019-04-22 DIAGNOSIS — Z0000 Encounter for general adult medical examination without abnormal findings: Secondary | ICD-10-CM

## 2019-04-22 NOTE — Patient Instructions (Signed)
[name] , Thank you for taking time to come for your Medicare Wellness Visit. I appreciate your ongoing commitment to your health goals. Please review the following plan we discussed and let me know if I can assist you in the future.   Screening recommendations/referrals: Colonoscopy: Per chart it is due in 2022 if he has any questions or concerns just please let us know Mammogram: Completed Bone Density: Completed Recommended yearly ophthalmology/optometry visit for glaucoma screening and checkup Recommended yearly dental visit for hygiene and checkup  Vaccinations: Influenza vaccine: Next dose is due Fall 2020 Pneumococcal vaccine: Completed Tdap vaccine: Up-to-date at this time Shingles vaccine: We can provide you with a prescription for this for you to follow-up on either before or after your eye surgery.  Advanced directives: Can discuss at the next office appointment with Dr. Moshe Cipro.  Conditions/risks identified: Fall.  Please read information below to make sure that you are home is safe.  Next appointment: 06/15/2019 1020 am for 6 month follow up with Dr Moshe Cipro   Preventive Care 65 Years and Older, Female Preventive care refers to lifestyle choices and visits with your health care provider that can promote health and wellness. What does preventive care include?  A yearly physical exam. This is also called an annual well check.  Dental exams once or twice a year.  Routine eye exams. Ask your health care provider how often you should have your eyes checked.  Personal lifestyle choices, including:  Daily care of your teeth and gums.  Regular physical activity.  Eating a healthy diet.  Avoiding tobacco and drug use.  Limiting alcohol use.  Practicing safe sex.  Taking low-dose aspirin every day.  Taking vitamin and mineral supplements as recommended by your health care provider. What happens during an annual well check? The services and screenings done by  your health care provider during your annual well check will depend on your age, overall health, lifestyle risk factors, and family history of disease. Counseling  Your health care provider may ask you questions about your:  Alcohol use.  Tobacco use.  Drug use.  Emotional well-being.  Home and relationship well-being.  Sexual activity.  Eating habits.  History of falls.  Memory and ability to understand (cognition).  Work and work Statistician.  Reproductive health. Screening  You may have the following tests or measurements:  Height, weight, and BMI.  Blood pressure.  Lipid and cholesterol levels. These may be checked every 5 years, or more frequently if you are over 57 years old.  Skin check.  Lung cancer screening. You may have this screening every year starting at age 26 if you have a 30-pack-year history of smoking and currently smoke or have quit within the past 15 years.  Fecal occult blood test (FOBT) of the stool. You may have this test every year starting at age 72.  Flexible sigmoidoscopy or colonoscopy. You may have a sigmoidoscopy every 5 years or a colonoscopy every 10 years starting at age 16.  Hepatitis C blood test.  Hepatitis B blood test.  Sexually transmitted disease (STD) testing.  Diabetes screening. This is done by checking your blood sugar (glucose) after you have not eaten for a while (fasting). You may have this done every 1-3 years.  Bone density scan. This is done to screen for osteoporosis. You may have this done starting at age 92.  Mammogram. This may be done every 1-2 years. Talk to your health care provider about how often you should have  regular mammograms. Talk with your health care provider about your test results, treatment options, and if necessary, the need for more tests. Vaccines  Your health care provider may recommend certain vaccines, such as:  Influenza vaccine. This is recommended every year.  Tetanus,  diphtheria, and acellular pertussis (Tdap, Td) vaccine. You may need a Td booster every 10 years.  Zoster vaccine. You may need this after age 85.  Pneumococcal 13-valent conjugate (PCV13) vaccine. One dose is recommended after age 39.  Pneumococcal polysaccharide (PPSV23) vaccine. One dose is recommended after age 59. Talk to your health care provider about which screenings and vaccines you need and how often you need them. This information is not intended to replace advice given to you by your health care provider. Make sure you discuss any questions you have with your health care provider. Document Released: 12/22/2015 Document Revised: 08/14/2016 Document Reviewed: 09/26/2015 Elsevier Interactive Patient Education  2017 [place] Prevention in the Home Falls can cause injuries. They can happen to people of all ages. There are many things you can do to make your home safe and to help prevent falls. What can I do on the outside of my home?  Regularly fix the edges of walkways and driveways and fix any cracks.  Remove anything that might make you trip as you walk through a door, such as a raised step or threshold.  Trim any bushes or trees on the path to your home.  Use bright outdoor lighting.  Clear any walking paths of anything that might make someone trip, such as rocks or tools.  Regularly check to see if handrails are loose or broken. Make sure that both sides of any steps have handrails.  Any raised decks and porches should have guardrails on the edges.  Have any leaves, snow, or ice cleared regularly.  Use sand or salt on walking paths during winter.  Clean up any spills in your garage right away. This includes oil or grease spills. What can I do in the bathroom?  Use night lights.  Install grab bars by the toilet and in the tub and shower. Do not use towel bars as grab bars.  Use non-skid mats or decals in the tub or shower.  If you need to sit down in  the shower, use a plastic, non-slip stool.  Keep the floor dry. Clean up any water that spills on the floor as soon as it happens.  Remove soap buildup in the tub or shower regularly.  Attach bath mats securely with double-sided non-slip rug tape.  Do not have throw rugs and other things on the floor that can make you trip. What can I do in the bedroom?  Use night lights.  Make sure that you have a light by your bed that is easy to reach.  Do not use any sheets or blankets that are too big for your bed. They should not hang down onto the floor.  Have a firm chair that has side arms. You can use this for support while you get dressed.  Do not have throw rugs and other things on the floor that can make you trip. What can I do in the kitchen?  Clean up any spills right away.  Avoid walking on wet floors.  Keep items that you use a lot in easy-to-reach places.  If you need to reach something above you, use a strong step stool that has a grab bar.  Keep electrical cords out of the  way.  Do not use floor polish or wax that makes floors slippery. If you must use wax, use non-skid floor wax.  Do not have throw rugs and other things on the floor that can make you trip. What can I do with my stairs?  Do not leave any items on the stairs.  Make sure that there are handrails on both sides of the stairs and use them. Fix handrails that are broken or loose. Make sure that handrails are as long as the stairways.  Check any carpeting to make sure that it is firmly attached to the stairs. Fix any carpet that is loose or worn.  Avoid having throw rugs at the top or bottom of the stairs. If you do have throw rugs, attach them to the floor with carpet tape.  Make sure that you have a light switch at the top of the stairs and the bottom of the stairs. If you do not have them, ask someone to add them for you. What else can I do to help prevent falls?  Wear shoes that:  Do not have high  heels.  Have rubber bottoms.  Are comfortable and fit you well.  Are closed at the toe. Do not wear sandals.  If you use a stepladder:  Make sure that it is fully opened. Do not climb a closed stepladder.  Make sure that both sides of the stepladder are locked into place.  Ask someone to hold it for you, if possible.  Clearly mark and make sure that you can see:  Any grab bars or handrails.  First and last steps.  Where the edge of each step is.  Use tools that help you move around (mobility aids) if they are needed. These include:  Canes.  Walkers.  Scooters.  Crutches.  Turn on the lights when you go into a dark area. Replace any light bulbs as soon as they burn out.  Set up your furniture so you have a clear path. Avoid moving your furniture around.  If any of your floors are uneven, fix them.  If there are any pets around you, be aware of where they are.  Review your medicines with your doctor. Some medicines can make you feel dizzy. This can increase your chance of falling. Ask your doctor what other things that you can do to help prevent falls. This information is not intended to replace advice given to you by your health care provider. Make sure you discuss any questions you have with your health care provider. Document Released: 09/21/2009 Document Revised: 05/02/2016 Document Reviewed: 12/30/2014 Elsevier Interactive Patient Education  2017 [employer].

## 2019-04-22 NOTE — Progress Notes (Signed)
Subjective:   [name] is a 68 y.o. female who presents for Medicare Annual (Subsequent) preventive examination.  Location of Patient: Home Location of Provider: Telehealth Consent was obtain for visit to be over via telehealth.  verified that I am speaking with the correct person using two identifiers.    Review of Systems:    Cardiac Risk Factors include: advanced age (>34men, >55 women);dyslipidemia;obesity (BMI >30kg/m2);hypertension     Objective:     Vitals: BP 124/80   Ht 5\' 6"  (1.676 m)   Wt 194 lb (88 kg)   BMI 31.31 kg/m   Body mass index is 31.31 kg/m.  Advanced Directives 04/20/2018 12/30/2016 03/26/2015  Does Patient Have a Medical Advance Directive? No No No  Would patient like information on creating a medical advance directive? Yes (ED - Information included in AVS) Yes (MAU/Ambulatory/Procedural Areas - Information given) No - patient declined information    Tobacco Social History   Tobacco Use  Smoking Status Former Smoker  . Packs/day: 0.50  . Years: 18.00  . Pack years: 9.00  . Last attempt to quit: 1989  . Years since quitting: 31.3  Smokeless Tobacco Never Used     Counseling given: Yes   Clinical Intake:  Pre-visit preparation completed: Yes  Pain : No/denies pain     BMI - recorded: 31.31 Nutritional Status: BMI > 30  Obese Nutritional Risks: None Diabetes: No  How often do you need to have someone help you when you read instructions, pamphlets, or other written materials from your doctor or pharmacy?: 5 - Always What is the last grade level you completed in school?: 12  Interpreter Needed?: No     Past Medical History:  Diagnosis Date  . Allergy   . Anxiety   . Arthritis   . Asthma   . Hyperlipidemia   . Hypertension   . Vision abnormalities    states close to blindness due to cataracts   Past Surgical History:  Procedure Laterality Date  . caesarean    . cataracts    . CESAREAN SECTION    . TOTAL  ABDOMINAL HYSTERECTOMY     prolapse   Family History  Problem Relation Age of Onset  . Cancer Mother 50       stomach  . Kidney disease Mother   . Hypertension Mother   . Diabetes Father   . Cancer Father        lung   . Diabetes Sister   . Alcohol abuse Brother   . Hyperlipidemia Sister   . Diabetes Sister   . Diabetes Sister   . Diabetes Brother   . Diabetes Brother   . Diabetes Brother   . Diabetes Brother   . Cancer Daughter 40       cervix  . Arthritis Other   . Asthma Other    Social History   Socioeconomic History  . Marital status: Single    Spouse name: Not on file  . Number of children: Not on file  . Years of education: 12th grade  . Highest education level: Not on file  Occupational History  . Occupation: disabled    Fish farm manager: NOT EMPLOYED  Social Needs  . Financial resource strain: Not hard at all  . Food insecurity:    Worry: Never true    Inability: Never true  . Transportation needs:    Medical: No    Non-medical: No  Tobacco Use  . Smoking status: Former Smoker  Packs/day: 0.50    Years: 18.00    Pack years: 9.00    Last attempt to quit: 1989    Years since quitting: 31.3  . Smokeless tobacco: Never Used  Substance and Sexual Activity  . Alcohol use: No  . Drug use: No  . Sexual activity: Not Currently  Lifestyle  . Physical activity:    Days per week: 0 days    Minutes per session: 0 min  . Stress: Not at all  Relationships  . Social connections:    Talks on phone: Once a week    Gets together: Once a week    Attends religious service: More than 4 times per year    Active member of club or organization: Yes    Attends meetings of clubs or organizations: More than 4 times per year    Relationship status: Never married  Other Topics Concern  . Not on file  Social History Narrative  . Not on file    Outpatient Encounter Medications as of 04/22/2019  Medication Sig  . acetaminophen (TYLENOL) 650 MG CR tablet Take 650 mg by  mouth as needed for pain.  Marland Kitchen amLODipine (NORVASC) 10 MG tablet TAKE 1 TABLET BY MOUTH AT BEDTIME.  . betamethasone dipropionate (DIPROLENE) 0.05 % cream Apply topically 2 (two) times daily. Apply twice daily to rash on left elbow for 7 days, then as needed  . budesonide-formoterol (SYMBICORT) 80-4.5 MCG/ACT inhaler Inhale 2 puffs into the lungs 2 (two) times daily.  . Calcium Carb-Cholecalciferol (CALCIUM 500 +D) 500-400 MG-UNIT TABS Take tab twice daily (Patient taking differently: Take 1 tablet by mouth 2 (two) times daily. Take tab twice daily)  . cetirizine (ZYRTEC) 10 MG tablet TAKE 1 TABLET BY MOUTH EVERY DAY.  . cyclobenzaprine (FLEXERIL) 5 MG tablet Take one tablet at bedtime, as needed, for neck pain and spsm  . ergocalciferol (VITAMIN D2) 50000 units capsule Take 1 capsule (50,000 Units total) by mouth once a week. One capsule once weekly  . fluticasone (FLONASE) 50 MCG/ACT nasal spray USE 1 SPRAY IN EACH NOSTRIL DAILY.  . fluticasone (FLONASE) 50 MCG/ACT nasal spray USE 1 SPRAY IN EACH NOSTRIL DAILY.  Marland Kitchen levothyroxine (SYNTHROID) 100 MCG tablet TAKE 1 TABLET BY MOUTH DAILY.  Marland Kitchen lovastatin (MEVACOR) 40 MG tablet TAKE (1) TABLET BY MOUTH AT BEDTIME.  . montelukast (SINGULAIR) 10 MG tablet TAKE (1) TABLET BY MOUTH AT BEDTIME.  . nabumetone (RELAFEN) 750 MG tablet TAKE ONE TABLET BY MOUTH DAILY.  . nabumetone (RELAFEN) 750 MG tablet TAKE ONE TABLET BY MOUTH DAILY.  . pantoprazole (PROTONIX) 20 MG tablet TAKE ONE TABLET BY MOUTH DAILY.  Marland Kitchen PARoxetine (PAXIL) 20 MG tablet TAKE ONE TABLET BY MOUTH DAILY.  Marland Kitchen predniSONE (STERAPRED UNI-PAK 21 TAB) 5 MG (21) TBPK tablet Take 1 tablet (5 mg total) by mouth as directed. Use as directed  . triamterene-hydrochlorothiazide (MAXZIDE-25) 37.5-25 MG tablet Take 1 tablet by mouth daily.  . VENTOLIN HFA 108 (90 Base) MCG/ACT inhaler INHALE 2 PUFFS INTO THE LUNGS EVERY 4 HOURS AS NEEDED FOR COUGHNG ANDWHEEZING.  . [DISCONTINUED] fluticasone (FLONASE) 50  MCG/ACT nasal spray USE 1 SPRAY IN EACH NOSTRIL DAILY. (Patient not taking: Reported on 04/22/2019)  . [DISCONTINUED] nabumetone (RELAFEN) 750 MG tablet TAKE ONE TABLET BY MOUTH DAILY. (Patient not taking: Reported on 04/22/2019)   No facility-administered encounter medications on file as of 04/22/2019.     Activities of Daily Living In your present state of health, do you have any difficulty  performing the following activities: 04/22/2019  Hearing? Y  Vision? Y  Difficulty concentrating or making decisions? Y  Comment sometimes, it comes with age  Walking or climbing stairs? N  Dressing or bathing? N  Doing errands, shopping? Y  Preparing Food and eating ? N  Using the Toilet? N  In the past six months, have you accidently leaked urine? N  Do you have problems with loss of bowel control? N  Managing your Medications? N  Managing your Finances? N  Housekeeping or managing your Housekeeping? N  Some recent data might be hidden    Patient Care Team: Fayrene Helper, MD as PCP - General Leta Baptist, MD as Consulting Physician (Otolaryngology) Carole Civil, MD as Consulting Physician (Orthopedic Surgery)    Assessment:   This is a routine wellness examination for [name].  Exercise Activities and Dietary recommendations Current Exercise Habits: The patient does not participate in regular exercise at present, Exercise limited by: None identified  Goals    . Reduce sugar intake     Starting 12/31/2016 patient would like to decrease the amount of sweets she eats in a day. When craving sweets she will try to to replace it with a piece of fruit.       Fall Risk Fall Risk  04/22/2019 12/15/2018 09/30/2018 07/28/2018 04/20/2018  Falls in the past year? 0 0 No No No  Number falls in past yr: - 0 - - -  Injury with Fall? 0 0 - - -  Risk for fall due to : - Impaired vision;Impaired mobility - - -   Is the patient's home free of loose throw rugs in walkways, pet beds, electrical cords,  etc?   yes      Grab bars in the bathroom? yes      Handrails on the stairs?   no stairs      Adequate lighting?   yes  Timed Get Up and Go performed:  Telemedicine, not assess Depression Screen PHQ 2/9 Scores 04/22/2019 12/15/2018 12/15/2018 09/30/2018  PHQ - 2 Score 0 2 0 0  PHQ- 9 Score - 3 - -     Cognitive Function     6CIT Screen 04/22/2019 04/20/2018 12/30/2016  What Year? 0 points 0 points 0 points  What month? 0 points 0 points 0 points  What time? 0 points - 0 points  Count back from 20 0 points 0 points 0 points  Months in reverse 0 points 0 points 0 points  Repeat phrase 0 points 0 points 0 points  Total Score 0 - 0    Immunization History  Administered Date(s) Administered  . H1N1 11/15/2008  . Influenza Split 08/30/2011, 08/14/2012  . Influenza Whole 09/15/2009, 09/26/2010  . Influenza,inj,Quad PF,6+ Mos 10/18/2013, 08/25/2014, 12/13/2015, 08/05/2016, 09/22/2017, 07/28/2018  . Pneumococcal Conjugate-13 05/03/2015  . Pneumococcal Polysaccharide-23 08/05/2016  . Td 03/02/2004  . Tdap 12/12/2011    Qualifies for Shingles Vaccine? Yes, discuss with Dr Moshe Cipro. Was suppose to get a surgery and so she postponed.  Screening Tests Health Maintenance  Topic Date Due  . INFLUENZA VACCINE  07/10/2019  . MAMMOGRAM  07/03/2020  . COLONOSCOPY  05/12/2021  . TETANUS/TDAP  12/11/2021  . DEXA SCAN  Completed  . Hepatitis C Screening  Completed  . PNA vac Low Risk Adult  Completed    Cancer Screenings: Lung: Low Dose CT Chest recommended if Age 27-80 years, 30 pack-year currently smoking OR have quit w/in 15years. Patient does not qualify. Breast:  Up to date on Mammogram? Yes   Up to date of Bone Density/Dexa? Yes  Colorectal:  Due in 2022  Additional Screenings:   Hepatitis C Screening: completed     Plan:      1. Encounter for Medicare annual wellness exam  I have personally reviewed and noted the following in the patient's chart:   . Medical and social  history . Use of alcohol, tobacco or illicit drugs  . Current medications and supplements . Functional ability and status . Nutritional status . Physical activity . Advanced directives . List of other physicians . Hospitalizations, surgeries, and ER visits in previous 12 months . Vitals . Screenings to include cognitive, depression, and falls . Referrals and appointments  In addition, I have reviewed and discussed with patient certain preventive protocols, quality metrics, and best practice recommendations. A written personalized care plan for preventive services as well as general preventive health recommendations were provided to patient.   I provided 20 minutes of non-face-to-face time during this encounter.   Perlie Mayo, NP  04/22/2019

## 2019-04-23 ENCOUNTER — Ambulatory Visit: Payer: Medicare Other

## 2019-04-26 ENCOUNTER — Ambulatory Visit: Payer: Medicare Other

## 2019-05-07 ENCOUNTER — Other Ambulatory Visit: Payer: Self-pay | Admitting: Family Medicine

## 2019-05-20 ENCOUNTER — Other Ambulatory Visit: Payer: Self-pay | Admitting: Family Medicine

## 2019-06-10 ENCOUNTER — Other Ambulatory Visit: Payer: Self-pay | Admitting: Family Medicine

## 2019-06-15 ENCOUNTER — Ambulatory Visit (INDEPENDENT_AMBULATORY_CARE_PROVIDER_SITE_OTHER): Payer: Medicare Other | Admitting: Family Medicine

## 2019-06-15 ENCOUNTER — Encounter: Payer: Self-pay | Admitting: Family Medicine

## 2019-06-15 ENCOUNTER — Encounter (INDEPENDENT_AMBULATORY_CARE_PROVIDER_SITE_OTHER): Payer: Self-pay

## 2019-06-15 ENCOUNTER — Other Ambulatory Visit: Payer: Self-pay

## 2019-06-15 VITALS — BP 128/72 | HR 70 | Resp 12 | Ht 66.0 in | Wt 202.0 lb

## 2019-06-15 DIAGNOSIS — Z1231 Encounter for screening mammogram for malignant neoplasm of breast: Secondary | ICD-10-CM | POA: Diagnosis not present

## 2019-06-15 DIAGNOSIS — M25551 Pain in right hip: Secondary | ICD-10-CM | POA: Diagnosis not present

## 2019-06-15 DIAGNOSIS — Z9109 Other allergy status, other than to drugs and biological substances: Secondary | ICD-10-CM | POA: Diagnosis not present

## 2019-06-15 DIAGNOSIS — E785 Hyperlipidemia, unspecified: Secondary | ICD-10-CM | POA: Diagnosis not present

## 2019-06-15 DIAGNOSIS — H548 Legal blindness, as defined in USA: Secondary | ICD-10-CM

## 2019-06-15 DIAGNOSIS — G8929 Other chronic pain: Secondary | ICD-10-CM

## 2019-06-15 DIAGNOSIS — R7301 Impaired fasting glucose: Secondary | ICD-10-CM | POA: Diagnosis not present

## 2019-06-15 DIAGNOSIS — E559 Vitamin D deficiency, unspecified: Secondary | ICD-10-CM | POA: Diagnosis not present

## 2019-06-15 DIAGNOSIS — I10 Essential (primary) hypertension: Secondary | ICD-10-CM

## 2019-06-15 DIAGNOSIS — E038 Other specified hypothyroidism: Secondary | ICD-10-CM | POA: Diagnosis not present

## 2019-06-15 DIAGNOSIS — M25572 Pain in left ankle and joints of left foot: Secondary | ICD-10-CM | POA: Diagnosis not present

## 2019-06-15 MED ORDER — CETIRIZINE HCL 10 MG PO TBDP: ORAL_TABLET | ORAL | 3 refills | Status: DC

## 2019-06-15 MED ORDER — ERGOCALCIFEROL 1.25 MG (50000 UT) PO CAPS: 50000.0000 [IU] | ORAL_CAPSULE | ORAL | 1 refills | Status: DC

## 2019-06-15 NOTE — Patient Instructions (Addendum)
F/U in 4 months, call if you need me sooner  Pls schedule mammogram at checkout  Labs today, lipid, cmp and EGFr tSH , vit D and HBA1C  Once weekly vitamin D is pescribed  Increase zyrtec to twice daily as needed  Paperwork for help with grocery shopping bill payment, cleaning and cooking will be completed  Please get x ray of left ankle as soon as you are able  Thanks for choosing [facility], we consider it a privelige to serve you.

## 2019-06-15 NOTE — Assessment & Plan Note (Addendum)
1 week h/o pain and deformity of left ankle following trauma, needs x ray to further evaluate

## 2019-06-16 LAB — LIPID PANEL
Cholesterol: 188 mg/dL (ref <200)
HDL: 59 mg/dL (ref >=50)
LDL Cholesterol (Calc): 105 mg/dL (calc) — ABNORMAL HIGH
Non-HDL Cholesterol (Calc): 129 mg/dL (calc) (ref <130)
Total CHOL/HDL Ratio: 3.2 (calc) (ref <5.0)
Triglycerides: 143 mg/dL (ref <150)

## 2019-06-16 LAB — COMPLETE METABOLIC PANEL WITH GFR
AG Ratio: 1.1 (calc) (ref 1.0–2.5)
ALT: 26 U/L (ref 6–29)
AST: 27 U/L (ref 10–35)
Albumin: 4 g/dL (ref 3.6–5.1)
Alkaline phosphatase (APISO): 50 U/L (ref 37–153)
BUN: 17 mg/dL (ref 7–25)
CO2: 29 mmol/L (ref 20–32)
Calcium: 9.6 mg/dL (ref 8.6–10.4)
Chloride: 103 mmol/L (ref 98–110)
Creat: 0.79 mg/dL (ref 0.50–0.99)
GFR, Est African American: 89 mL/min/{1.73_m2} (ref >=60)
GFR, Est Non African American: 76 mL/min/{1.73_m2} (ref >=60)
Globulin: 3.5 g/dL (calc) (ref 1.9–3.7)
Glucose, Bld: 91 mg/dL (ref 65–99)
Potassium: 4.3 mmol/L (ref 3.5–5.3)
Sodium: 142 mmol/L (ref 135–146)
Total Bilirubin: 0.4 mg/dL (ref 0.2–1.2)
Total Protein: 7.5 g/dL (ref 6.1–8.1)

## 2019-06-16 LAB — HEMOGLOBIN A1C
Hgb A1c MFr Bld: 5.6 % of total Hgb (ref <5.7)
Mean Plasma Glucose: 114 (calc)
eAG (mmol/L): 6.3 (calc)

## 2019-06-16 LAB — TSH: TSH: 1.54 mIU/L (ref 0.40–4.50)

## 2019-06-16 LAB — VITAMIN D 25 HYDROXY (VIT D DEFICIENCY, FRACTURES): Vit D, 25-Hydroxy: 37 ng/mL (ref 30–100)

## 2019-06-19 ENCOUNTER — Encounter: Payer: Self-pay | Admitting: Family Medicine

## 2019-06-19 DIAGNOSIS — H548 Legal blindness, as defined in USA: Secondary | ICD-10-CM | POA: Insufficient documentation

## 2019-06-19 NOTE — Assessment & Plan Note (Signed)
Increased and uncontroled symptoms, pt advised to doable zyrtec for next 1 to 2 weeks as needed

## 2019-06-19 NOTE — Progress Notes (Signed)
[name]     MRN: [id number]      DOB: May 09, 1951   HPI Ms. Howry is here for follow up and re-evaluation of chronic medical conditions, medication management and review of any available recent lab and radiology data.  Preventive health is updated, specifically  Cancer screening and Immunization.   Questions or concerns regarding consultations or procedures which the PT has had in the interim are  addressed. The PT denies any adverse reactions to current medications since the last visit.  C/O LEFT ANKLE SWELLING AND DECREASED MOBILITY FOLLOWING RECENT INJURY C/O LIMITATION I HER ABILITY TO INDEPENDENTLY MANAGE GROCERY SHOPPING, BILL PAYMENT AND KEEPING HER HOME CLEAN  DUE TO BEING LEGALLY BLIND AS WELLS AS SEVERE ARTHRITIS OF THE SPINE. HER SISTER WHO HAS BEEN ASSISTING HER WITH THIS IS NO LONGER ABLE AND SHE IS REQUESTING THAT AN APPLICATION FOR ASSISTANCE BE MADE , WHICH IS APPROPRIATE  ROS Denies recent fever or chills. Denies sinus pressure, C/O  Increased nasal congestion, sneezing and watery eyes, denies sore throat or ear pain Denies chest congestion, productive cough or wheezing. Denies chest pains, palpitations and leg swelling Denies abdominal pain, nausea, vomiting,diarrhea or constipation.   Denies dysuria, frequency, hesitancy or incontinence.  Denies headaches, seizures, numbness, or tingling. Denies depression, anxiety or insomnia. Denies skin break down or rash.   PE  BP 128/72   Pulse 70   Resp 12   Ht 5\' 6"  (1.676 m)   Wt 202 lb (91.6 kg)   SpO2 98%   BMI 32.60 kg/m   Patient alert and oriented and in no cardiopulmonary distress.  HEENT: No facial asymmetry, EOMI,   oropharynx pink and moist.  Neck decreased ROM no JVD, no mass.  Chest: Clear to auscultation bilaterally.  CVS: S1, S2 no murmurs, no S3.Regular rate.  ABD: Soft non tender.   Ext: No edema  NW:GNFAOZHY reduced  ROM spine,  hips and knees.Ambulates with assistance of a  cane, left ankle deformed and  tender  Skin: Intact, no ulcerations or rash noted.  Psych: normal affect. Memory intact not anxious or depressed appearing.  CNS: CN legally lind, otherwise normal, reduced power and sensation in RLE.Marland Kitchen   Assessment & Plan  Ankle pain, left 1 week h/o pain and deformity of left ankle following trauma, needs x ray to further evaluate  Essential hypertension Controlled, no change in medication DASH diet and commitment to daily physical activity for a minimum of 30 minutes discussed and encouraged, as a part of hypertension management. The importance of attaining a healthy weight is also discussed.  BP/Weight 06/15/2019 04/22/2019 12/15/2018 09/30/2018 07/28/2018 8/65/7846 08/15/2951  Systolic BP 841 324 401 027 253 664 403  Diastolic BP 72 80 80 80 82 78 82  Wt. (Lbs) 202 194 200 194 193 195 192  BMI 32.6 31.31 32.28 31.31 31.15 31.47 30.99       Hyperlipidemia LDL goal <100 Hyperlipidemia:Low fat diet discussed and encouraged.   Lipid Panel  Lab Results  Component Value Date   CHOL 188 06/15/2019   HDL 59 06/15/2019   LDLCALC 105 (H) 06/15/2019   TRIG 143 06/15/2019   CHOLHDL 3.2 06/15/2019  needs to reduce fat in diet     Multiple environmental allergies Increased and uncontroled symptoms, pt advised to doable zyrtec for next 1 to 2 weeks as needed  Legally blind Needs assistance with ADL's , family member no longer able  HIP PAIN, RIGHT Chronic debilitating arthritis , requires cane  for ambulation, needs in home assistance with cleaning as well as transport to grocery and for bill payment

## 2019-06-19 NOTE — Assessment & Plan Note (Signed)
Chronic debilitating arthritis , requires cane for ambulation, needs in home assistance with cleaning as well as transport to grocery and for bill payment

## 2019-06-19 NOTE — Assessment & Plan Note (Signed)
Hyperlipidemia:Low fat diet discussed and encouraged.   Lipid Panel  Lab Results  Component Value Date   CHOL 188 06/15/2019   HDL 59 06/15/2019   LDLCALC 105 (H) 06/15/2019   TRIG 143 06/15/2019   CHOLHDL 3.2 06/15/2019  needs to reduce fat in diet

## 2019-06-19 NOTE — Assessment & Plan Note (Signed)
Controlled, no change in medication DASH diet and commitment to daily physical activity for a minimum of 30 minutes discussed and encouraged, as a part of hypertension management. The importance of attaining a healthy weight is also discussed.  BP/Weight 06/15/2019 04/22/2019 12/15/2018 09/30/2018 07/28/2018 05/14/44 08/17/7740  Systolic BP 423 953 202 334 356 861 683  Diastolic BP 72 80 80 80 82 78 82  Wt. (Lbs) 202 194 200 194 193 195 192  BMI 32.6 31.31 32.28 31.31 31.15 31.47 30.99

## 2019-06-19 NOTE — Assessment & Plan Note (Signed)
Needs assistance with ADL's , family member no longer able

## 2019-06-25 ENCOUNTER — Other Ambulatory Visit: Payer: Self-pay

## 2019-06-25 ENCOUNTER — Other Ambulatory Visit: Payer: Medicare Other

## 2019-06-25 DIAGNOSIS — R6889 Other general symptoms and signs: Secondary | ICD-10-CM | POA: Diagnosis not present

## 2019-06-25 DIAGNOSIS — Z20822 Contact with and (suspected) exposure to covid-19: Secondary | ICD-10-CM

## 2019-06-29 LAB — NOVEL CORONAVIRUS, NAA: SARS-CoV-2, NAA: NOT DETECTED

## 2019-07-02 ENCOUNTER — Telehealth: Payer: Self-pay | Admitting: Family Medicine

## 2019-07-02 NOTE — Telephone Encounter (Signed)
Pt called to get COVID test results, informed her they are negative.

## 2019-07-07 ENCOUNTER — Inpatient Hospital Stay (HOSPITAL_COMMUNITY): Admission: RE | Admit: 2019-07-07 | Payer: Medicare Other | Source: Ambulatory Visit

## 2019-07-09 ENCOUNTER — Ambulatory Visit (HOSPITAL_COMMUNITY)
Admission: RE | Admit: 2019-07-09 | Discharge: 2019-07-09 | Disposition: A | Discharge status: Home / Self Care | Payer: Medicare Other | Source: Ambulatory Visit | Attending: Family Medicine | Admitting: Family Medicine

## 2019-07-09 ENCOUNTER — Other Ambulatory Visit: Payer: Self-pay

## 2019-07-09 DIAGNOSIS — Z1231 Encounter for screening mammogram for malignant neoplasm of breast: Secondary | ICD-10-CM | POA: Insufficient documentation

## 2019-07-19 ENCOUNTER — Other Ambulatory Visit: Payer: Self-pay | Admitting: "Endocrinology

## 2019-08-24 ENCOUNTER — Other Ambulatory Visit: Payer: Self-pay | Admitting: Family Medicine

## 2019-08-31 ENCOUNTER — Encounter: Payer: Self-pay | Admitting: *Deleted

## 2019-09-23 ENCOUNTER — Other Ambulatory Visit: Payer: Self-pay

## 2019-09-23 ENCOUNTER — Ambulatory Visit (INDEPENDENT_AMBULATORY_CARE_PROVIDER_SITE_OTHER): Payer: Medicare Other | Admitting: Family Medicine

## 2019-09-23 DIAGNOSIS — J45991 Cough variant asthma: Secondary | ICD-10-CM | POA: Diagnosis not present

## 2019-09-23 DIAGNOSIS — M25551 Pain in right hip: Secondary | ICD-10-CM | POA: Diagnosis not present

## 2019-09-23 DIAGNOSIS — F341 Dysthymic disorder: Secondary | ICD-10-CM

## 2019-09-23 DIAGNOSIS — I10 Essential (primary) hypertension: Secondary | ICD-10-CM | POA: Diagnosis not present

## 2019-09-23 DIAGNOSIS — Z9109 Other allergy status, other than to drugs and biological substances: Secondary | ICD-10-CM

## 2019-09-23 MED ORDER — PREDNISONE 10 MG PO TABS: 10.0000 mg | ORAL_TABLET | Freq: Two times a day (BID) | ORAL | 0 refills | Status: AC

## 2019-09-23 NOTE — Progress Notes (Signed)
Virtual Visit via Telephone Note  I connected with [name] on 09/23/19 at 11:00 AM EDT by telephone and verified that I am speaking with the correct person using two identifiers.  Location: Patient: home Provider: office   I discussed the limitations, risks, security and privacy concerns of performing an evaluation and management service by telephone and the availability of in person appointments. I also discussed with the patient that there may be a patient responsible charge related to this service. The patient expressed understanding and agreed to proceed.   History of Present Illness: C/o increased and uncontrolled right hip pain, no recent trauma, flare of sciatica with temperature change, requests injections for this, generally helpful, will need to arrange in office nurse visit for same Denies recent fever or chills. Denies sinus pressure, nasal congestion, ear pain or sore throat. Denies chest congestion, productive cough or wheezing. Denies chest pains, palpitations and leg swelling Denies abdominal pain, nausea, vomiting,diarrhea or constipation.   Denies dysuria, frequency, hesitancy or incontinence. Denies headaches, seizures, numbness, or tingling. Denies depression, anxiety or insomnia. Denies skin break down or rash.       Observations/Objective: BP 128/72   Ht 5\' 6"  (1.676 m)   Wt 202 lb (91.6 kg)   BMI 32.60 kg/m  Good communication with no confusion and intact memory. Alert and oriented x 3 No signs of respiratory distress during speech    Assessment and Plan:  HIP PAIN, RIGHT Current flare needs in office nurse visit in near future for Toradol 60 mg and depo medrol 80 mg IM.Short course of prednisone is prescribed  Multiple environmental allergies Controlled on current medication , continue same  Essential hypertension Controlled, no change in medication   Depression Controlled, no change in medication   Asthma, cough  variant Controlled, no change in medication    Follow Up Instructions:    I discussed the assessment and treatment plan with the patient. The patient was provided an opportunity to ask questions and all were answered. The patient agreed with the plan and demonstrated an understanding of the instructions.   The patient was advised to call back or seek an in-person evaluation if the symptoms worsen or if the condition fails to improve as anticipated.  I provided 22 minutes of non-face-to-face time during this encounter.   Tula Nakayama, MD

## 2019-09-23 NOTE — Patient Instructions (Signed)
F/U with MD in 5 month call if you need me before  Please schedule appointment for nurse visit next 1 week for flu vaccine and Toradol 60 mg  And depo medrol 80 mg IM   I have prescribed a 5 day course of prednisone for you to take for your sciatic pain   Careful not to fall  Thanks for choosing [facility], we consider it a privelige to serve you.

## 2019-09-25 ENCOUNTER — Encounter: Payer: Self-pay | Admitting: Family Medicine

## 2019-09-25 NOTE — Assessment & Plan Note (Signed)
Controlled, no change in medication  

## 2019-09-25 NOTE — Assessment & Plan Note (Signed)
Current flare needs in office nurse visit in near future for Toradol 60 mg and depo medrol 80 mg IM.Short course of prednisone is prescribed

## 2019-09-25 NOTE — Assessment & Plan Note (Signed)
Controlled on current medication, continue same 

## 2019-10-06 ENCOUNTER — Other Ambulatory Visit: Payer: Self-pay

## 2019-10-06 ENCOUNTER — Ambulatory Visit (INDEPENDENT_AMBULATORY_CARE_PROVIDER_SITE_OTHER): Payer: Medicare Other

## 2019-10-06 DIAGNOSIS — Z23 Encounter for immunization: Secondary | ICD-10-CM

## 2019-10-06 DIAGNOSIS — M549 Dorsalgia, unspecified: Secondary | ICD-10-CM

## 2019-10-06 MED ORDER — METHYLPREDNISOLONE ACETATE 80 MG/ML IJ SUSP
80.0000 mg | Freq: Once | INTRAMUSCULAR | Status: AC
  Administered 2019-10-06: 80 mg via INTRAMUSCULAR

## 2019-10-06 MED ORDER — KETOROLAC TROMETHAMINE 60 MG/2ML IM SOLN
60.0000 mg | Freq: Once | INTRAMUSCULAR | Status: AC
  Administered 2019-10-06: 60 mg via INTRAMUSCULAR

## 2019-10-06 NOTE — Progress Notes (Signed)
T60 D80 given per MD for back pain

## 2019-10-14 ENCOUNTER — Telehealth: Payer: Self-pay

## 2019-10-14 ENCOUNTER — Ambulatory Visit: Payer: Medicare Other | Admitting: Family Medicine

## 2019-10-14 NOTE — Telephone Encounter (Signed)
Called patient and advised her that she would need an appt before any new medication was started. She refused any appt. I did advise her that Voltaren gel is now available otc with verbal understanding.

## 2019-10-14 NOTE — Telephone Encounter (Signed)
Pt is calling wanting a prescription of Voltran gel for arthritis

## 2019-10-20 ENCOUNTER — Other Ambulatory Visit: Payer: Self-pay | Admitting: Family Medicine

## 2019-11-22 ENCOUNTER — Other Ambulatory Visit: Payer: Self-pay | Admitting: "Endocrinology

## 2019-11-30 ENCOUNTER — Ambulatory Visit: Payer: Medicare Other | Attending: Internal Medicine

## 2019-11-30 ENCOUNTER — Other Ambulatory Visit: Payer: Self-pay

## 2019-11-30 DIAGNOSIS — Z20822 Contact with and (suspected) exposure to covid-19: Secondary | ICD-10-CM

## 2019-12-02 LAB — NOVEL CORONAVIRUS, NAA: SARS-CoV-2, NAA: NOT DETECTED

## 2019-12-22 ENCOUNTER — Other Ambulatory Visit: Payer: Self-pay | Admitting: Family Medicine

## 2020-01-10 ENCOUNTER — Other Ambulatory Visit: Payer: Self-pay | Admitting: Family Medicine

## 2020-01-16 ENCOUNTER — Other Ambulatory Visit: Payer: Self-pay

## 2020-01-16 ENCOUNTER — Ambulatory Visit: Payer: Medicare Other | Attending: Internal Medicine

## 2020-01-16 DIAGNOSIS — Z23 Encounter for immunization: Secondary | ICD-10-CM

## 2020-01-16 NOTE — Progress Notes (Signed)
   Covid-19 Vaccination Clinic  Name:  [name]    MRN: [id number] DOB: July 19, 1951  01/16/2020  [name] was observed post Covid-19 immunization for 15 minutes without incidence. She was provided with Vaccine Information Sheet and instruction to access the V-Safe system.   [name] was instructed to call 911 with any severe reactions post vaccine: Marland Kitchen Difficulty breathing  . Swelling of your face and throat  . A fast heartbeat  . A bad rash all over your body  . Dizziness and weakness    Immunizations Administered    Name Date Dose VIS Date Route   Moderna COVID-19 Vaccine 01/16/2020  3:21 PM 0.5 mL 11/09/2019 Intramuscular   Manufacturer: Moderna   Lot: [id number]   [id number]

## 2020-01-19 ENCOUNTER — Other Ambulatory Visit: Payer: Self-pay | Admitting: Family Medicine

## 2020-02-16 ENCOUNTER — Ambulatory Visit: Payer: Medicare Other | Attending: Internal Medicine

## 2020-02-16 ENCOUNTER — Other Ambulatory Visit: Payer: Self-pay

## 2020-02-16 DIAGNOSIS — Z23 Encounter for immunization: Secondary | ICD-10-CM

## 2020-02-16 NOTE — Progress Notes (Signed)
   Covid-19 Vaccination Clinic  Name:  [name]    MRN: [id number] DOB: 01/18/1951  02/16/2020  Ms. [name] was observed post Covid-19 immunization for 15 minutes without incident. She was provided with Vaccine Information Sheet and instruction to access the V-Safe system.   Ms. [name] was instructed to call 911 with any severe reactions post vaccine: Marland Kitchen Difficulty breathing  . Swelling of face and throat  . A fast heartbeat  . A bad rash all over body  . Dizziness and weakness   Immunizations Administered    Name Date Dose VIS Date Route   Moderna COVID-19 Vaccine 02/16/2020  1:38 PM 0.5 mL 11/09/2019 Intramuscular   Manufacturer: Moderna   Lot: [id number]   [id number]

## 2020-02-17 ENCOUNTER — Other Ambulatory Visit: Payer: Self-pay | Admitting: Family Medicine

## 2020-02-17 ENCOUNTER — Other Ambulatory Visit: Payer: Self-pay | Admitting: "Endocrinology

## 2020-02-21 ENCOUNTER — Ambulatory Visit: Payer: Medicare Other | Admitting: Family Medicine

## 2020-03-22 ENCOUNTER — Other Ambulatory Visit: Payer: Self-pay | Admitting: Family Medicine

## 2020-03-22 ENCOUNTER — Other Ambulatory Visit: Payer: Self-pay | Admitting: "Endocrinology

## 2020-03-27 ENCOUNTER — Other Ambulatory Visit: Payer: Self-pay

## 2020-03-27 DIAGNOSIS — E039 Hypothyroidism, unspecified: Secondary | ICD-10-CM

## 2020-03-27 MED ORDER — LEVOTHYROXINE SODIUM 100 MCG PO TABS: 100.0000 ug | ORAL_TABLET | Freq: Every day | ORAL | 0 refills | Status: DC

## 2020-04-11 LAB — T4, FREE: Free T4: 1.5 ng/dL (ref 0.8–1.8)

## 2020-04-11 LAB — TSH: TSH: 3.8 mIU/L (ref 0.40–4.50)

## 2020-04-13 ENCOUNTER — Encounter: Payer: Self-pay | Admitting: "Endocrinology

## 2020-04-13 ENCOUNTER — Other Ambulatory Visit: Payer: Self-pay

## 2020-04-13 ENCOUNTER — Ambulatory Visit (INDEPENDENT_AMBULATORY_CARE_PROVIDER_SITE_OTHER): Payer: Medicare Other | Admitting: "Endocrinology

## 2020-04-13 VITALS — BP 130/82 | HR 73 | Ht 66.0 in | Wt 215.0 lb

## 2020-04-13 DIAGNOSIS — E049 Nontoxic goiter, unspecified: Secondary | ICD-10-CM

## 2020-04-13 DIAGNOSIS — R7303 Prediabetes: Secondary | ICD-10-CM | POA: Diagnosis not present

## 2020-04-13 DIAGNOSIS — E039 Hypothyroidism, unspecified: Secondary | ICD-10-CM

## 2020-04-13 MED ORDER — LEVOTHYROXINE SODIUM 100 MCG PO TABS: 100.0000 ug | ORAL_TABLET | Freq: Every day | ORAL | 1 refills | Status: DC

## 2020-04-13 NOTE — Progress Notes (Signed)
04/13/2020  Endocrinology follow-up note   Subjective:    Patient ID: [name], female    DOB: 17-Apr-1951, PCP Fayrene Helper, MD   Past Medical History:  Diagnosis Date  . Allergy   . Anxiety   . Arthritis   . Asthma   . Hyperlipidemia   . Hypertension   . Vision abnormalities    states close to blindness due to cataracts   Past Surgical History:  Procedure Laterality Date  . caesarean    . cataracts    . CESAREAN SECTION    . TOTAL ABDOMINAL HYSTERECTOMY     prolapse   Social History   Socioeconomic History  . Marital status: Single    Spouse name: Not on file  . Number of children: Not on file  . Years of education: 12th grade  . Highest education level: Not on file  Occupational History  . Occupation: disabled    Fish farm manager: NOT EMPLOYED  Tobacco Use  . Smoking status: Former Smoker    Packs/day: 0.50    Years: 18.00    Pack years: 9.00    Quit date: 1989    Years since quitting: 32.3  . Smokeless tobacco: Never Used  Substance and Sexual Activity  . Alcohol use: No  . Drug use: No  . Sexual activity: Not Currently  Other Topics Concern  . Not on file  Social History Narrative  . Not on file   Social Determinants of Health   Financial Resource Strain:   . Difficulty of Paying Living Expenses:   Food Insecurity:   . Worried About Charity fundraiser in the Last Year:   . Arboriculturist in the Last Year:   Transportation Needs:   . Film/video editor (Medical):   Marland Kitchen Lack of Transportation (Non-Medical):   Physical Activity:   . Days of Exercise per Week:   . Minutes of Exercise per Session:   Stress:   . Feeling of Stress :   Social Connections:   . Frequency of Communication with Friends and Family:   . Frequency of Social Gatherings with Friends and Family:   . Attends Religious Services:   . Active Member of Clubs or Organizations:   . Attends Archivist Meetings:   Marland Kitchen Marital Status:    Outpatient Encounter  Medications as of 04/13/2020  Medication Sig  . acetaminophen (TYLENOL) 650 MG CR tablet Take 650 mg by mouth as needed for pain.  Marland Kitchen amLODipine (NORVASC) 10 MG tablet TAKE 1 TABLET BY MOUTH AT BEDTIME.  . betamethasone dipropionate (DIPROLENE) 0.05 % cream Apply topically 2 (two) times daily. Apply twice daily to rash on left elbow for 7 days, then as needed  . Calcium Carb-Cholecalciferol (CALCIUM 500 +D) 500-400 MG-UNIT TABS Take tab twice daily (Patient taking differently: Take 1 tablet by mouth 2 (two) times daily. Take tab twice daily)  . cetirizine (ZYRTEC) 10 MG tablet TAKE 1 TABLET BY MOUTH TWICE DAILY.  Marland Kitchen Cetirizine HCl 10 MG TBDP Take one tablet twice daily as needed, for allergies and urticaria  . Cholecalciferol (VITAMIN D3) 50 MCG (2000 UT) capsule Take 2,000 Units by mouth daily.  . cyclobenzaprine (FLEXERIL) 5 MG tablet TAKE 1 TABLET BY MOUTH AT BEDTIME AS NEEDED FOR NECK PAIN OR SPASM.  Marland Kitchen ergocalciferol (VITAMIN D2) 1.25 MG (50000 UT) capsule Take 1 capsule (50,000 Units total) by mouth once a week. One capsule once weekly  . fluticasone (FLONASE) 50 MCG/ACT nasal spray  USE 1 SPRAY IN EACH NOSTRIL DAILY.  . fluticasone (FLONASE) 50 MCG/ACT nasal spray USE 1 SPRAY IN EACH NOSTRIL DAILY.  . fluticasone (FLONASE) 50 MCG/ACT nasal spray USE 1 SPRAY IN EACH NOSTRIL DAILY.  . fluticasone (FLONASE) 50 MCG/ACT nasal spray USE 1 SPRAY IN EACH NOSTRIL DAILY.  . fluticasone (FLONASE) 50 MCG/ACT nasal spray USE 1 SPRAY IN EACH NOSTRIL DAILY.  Marland Kitchen levothyroxine (SYNTHROID) 100 MCG tablet Take 1 tablet (100 mcg total) by mouth daily.  Marland Kitchen lovastatin (MEVACOR) 40 MG tablet TAKE (1) TABLET BY MOUTH AT BEDTIME.  . montelukast (SINGULAIR) 10 MG tablet TAKE (1) TABLET BY MOUTH AT BEDTIME.  . nabumetone (RELAFEN) 750 MG tablet TAKE ONE TABLET BY MOUTH DAILY.  . nabumetone (RELAFEN) 750 MG tablet TAKE ONE TABLET BY MOUTH DAILY.  . nabumetone (RELAFEN) 750 MG tablet TAKE ONE TABLET BY MOUTH DAILY.  Marland Kitchen  PARoxetine (PAXIL) 20 MG tablet TAKE ONE TABLET BY MOUTH DAILY.  . SYMBICORT 80-4.5 MCG/ACT inhaler INHALE 2 PUFFS INTO THE LUNGS TWICE DAILY.  Marland Kitchen triamterene-hydrochlorothiazide (MAXZIDE-25) 37.5-25 MG tablet TAKE 1 TABLET BY MOUTH ONCE A DAY.  . VENTOLIN HFA 108 (90 Base) MCG/ACT inhaler INHALE 2 PUFFS INTO THE LUNGS EVERY 4 HOURS AS NEEDED FOR COUGHNG ANDWHEEZING.  . [DISCONTINUED] levothyroxine (SYNTHROID) 100 MCG tablet Take 1 tablet (100 mcg total) by mouth daily.  . [DISCONTINUED] pantoprazole (PROTONIX) 20 MG tablet TAKE ONE TABLET BY MOUTH DAILY.   No facility-administered encounter medications on file as of 04/13/2020.   ALLERGIES: Allergies  Allergen Reactions  . Lotensin Hct [Benazepril-Hydrochlorothiazide] Hives    VACCINATION STATUS: Immunization History  Administered Date(s) Administered  . Fluad Quad(high Dose 65+) 10/06/2019  . H1N1 11/15/2008  . Influenza Split 08/30/2011, 08/14/2012  . Influenza Whole 09/15/2009, 09/26/2010  . Influenza,inj,Quad PF,6+ Mos 10/18/2013, 08/25/2014, 12/13/2015, 08/05/2016, 09/22/2017, 07/28/2018  . Moderna SARS-COVID-2 Vaccination 01/16/2020, 02/16/2020  . Pneumococcal Conjugate-13 05/03/2015  . Pneumococcal Polysaccharide-23 08/05/2016  . Td 03/02/2004  . Tdap 12/12/2011  . Zoster Recombinat (Shingrix) 09/13/2019, 11/15/2019    HPI 69 year old female patient with medical history as above.  She is returning for follow-up with repeat thyroid function test for hypothyroidism.    -She is currently on levothyroxine 100 mcg p.o. daily before breakfast.  She reports compliance with medication.  She has no new complaints today.  -She has a steady weight. - She is a poor historian accompanied by her sister.  - She denies dysphagia, shortness of breath, nor voice change. She had  a series of thyroid ultrasound studies, last one from 05/02/2017 showing 1 cm solitary nodule on the left lobe of the thyroid . - Ultrasound description of the  thyroid summarized below. 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  Current Outpatient Medications:  .  acetaminophen (TYLENOL) 650 MG CR tablet, Take 650 mg by mouth as needed for pain., Disp: , Rfl:  .  amLODipine (NORVASC) 10 MG tablet, TAKE 1 TABLET BY MOUTH AT BEDTIME., Disp: 90 tablet, Rfl: 0 .  betamethasone dipropionate (DIPROLENE) 0.05 % cream, Apply topically 2 (two) times daily. Apply twice daily to rash on left elbow for 7 days, then as needed, Disp: 45 g, Rfl: 0 .  Calcium Carb-Cholecalciferol (CALCIUM 500 +D) 500-400 MG-UNIT TABS, Take tab twice daily (Patient taking differently: Take 1 tablet by mouth 2 (two) times daily. Take tab twice daily), Disp: 60 tablet, Rfl: 11 .  cetirizine (ZYRTEC) 10 MG tablet, TAKE 1 TABLET  BY MOUTH TWICE DAILY., Disp: 60 tablet, Rfl: 5 .  Cetirizine HCl 10 MG TBDP, Take one tablet twice daily as needed, for allergies and urticaria, Disp: 60 tablet, Rfl: 3 .  Cholecalciferol (VITAMIN D3) 50 MCG (2000 UT) capsule, Take 2,000 Units by mouth daily., Disp: , Rfl:  .  cyclobenzaprine (FLEXERIL) 5 MG tablet, TAKE 1 TABLET BY MOUTH AT BEDTIME AS NEEDED FOR NECK PAIN OR SPASM., Disp: 30 tablet, Rfl: 0 .  ergocalciferol (VITAMIN D2) 1.25 MG (50000 UT) capsule, Take 1 capsule (50,000 Units total) by mouth once a week. One capsule once weekly, Disp: 12 capsule, Rfl: 1 .  fluticasone (FLONASE) 50 MCG/ACT nasal spray, USE 1 SPRAY IN EACH NOSTRIL DAILY., Disp: 16 g, Rfl: 0 .  fluticasone (FLONASE) 50 MCG/ACT nasal spray, USE 1 SPRAY IN EACH NOSTRIL DAILY., Disp: 16 g, Rfl: 0 .  fluticasone (FLONASE) 50 MCG/ACT nasal spray, USE 1 SPRAY IN EACH NOSTRIL DAILY., Disp: 16 g, Rfl: 0 .  fluticasone (FLONASE) 50 MCG/ACT nasal spray, USE 1 SPRAY IN EACH NOSTRIL DAILY., Disp: 16 g, Rfl: 0 .  fluticasone (FLONASE) 50 MCG/ACT nasal spray, USE 1 SPRAY IN EACH NOSTRIL DAILY., Disp: 16 g, Rfl: 0 .  levothyroxine  (SYNTHROID) 100 MCG tablet, Take 1 tablet (100 mcg total) by mouth daily., Disp: 90 tablet, Rfl: 1 .  lovastatin (MEVACOR) 40 MG tablet, TAKE (1) TABLET BY MOUTH AT BEDTIME., Disp: 90 tablet, Rfl: 0 .  montelukast (SINGULAIR) 10 MG tablet, TAKE (1) TABLET BY MOUTH AT BEDTIME., Disp: 90 tablet, Rfl: 5 .  nabumetone (RELAFEN) 750 MG tablet, TAKE ONE TABLET BY MOUTH DAILY., Disp: 90 tablet, Rfl: 0 .  nabumetone (RELAFEN) 750 MG tablet, TAKE ONE TABLET BY MOUTH DAILY., Disp: 90 tablet, Rfl: 1 .  nabumetone (RELAFEN) 750 MG tablet, TAKE ONE TABLET BY MOUTH DAILY., Disp: 90 tablet, Rfl: 0 .  PARoxetine (PAXIL) 20 MG tablet, TAKE ONE TABLET BY MOUTH DAILY., Disp: 90 tablet, Rfl: 0 .  SYMBICORT 80-4.5 MCG/ACT inhaler, INHALE 2 PUFFS INTO THE LUNGS TWICE DAILY., Disp: 10.2 g, Rfl: 2 .  triamterene-hydrochlorothiazide (MAXZIDE-25) 37.5-25 MG tablet, TAKE 1 TABLET BY MOUTH ONCE A DAY., Disp: 90 tablet, Rfl: 0 .  VENTOLIN HFA 108 (90 Base) MCG/ACT inhaler, INHALE 2 PUFFS INTO THE LUNGS EVERY 4 HOURS AS NEEDED FOR COUGHNG ANDWHEEZING., Disp: 18 g, Rfl: 0  Past Medical History:  Diagnosis Date  . Allergy   . Anxiety   . Arthritis   . Asthma   . Hyperlipidemia   . Hypertension   . Vision abnormalities    states close to blindness due to cataracts   Past Surgical History:  Procedure Laterality Date  . caesarean    . cataracts    . CESAREAN SECTION    . 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, no sore throat, no nodules palpated in throat, no dysphagia/odynophagia, no hoarseness Cardiovascular: no Chest Pain, no Shortness of Breath, no palpitations, no leg swelling Musculoskeletal: no muscle/joint aches Skin: no rashes Neurological: no tremors, no numbness, no  tingling, no dizziness Psychiatric: no depression, no anxiety  Objective:    BP 130/82   Pulse 73   Ht 5\' 6"  (1.676 m)   Wt 215 lb (97.5 kg)  BMI 34.70 kg/m   Wt Readings from Last 3 Encounters:  04/13/20 215 lb (97.5 kg)  09/23/19 202 lb (91.6 kg)  06/15/19 202 lb (91.6 kg)    Physical Exam  Constitutional:  + Obese, not in acute distress, normal state of mind.  uses her walking cane due to visual impairment. Eyes: PERRLA, EOMI, no exophthalmos ENT: moist mucous membranes, no thyromegaly, no cervical lymphadenopathy  Musculoskeletal: no gross deformities, strength intact in all four extremities Skin: moist, warm, no rashes Neurological: no tremor with outstretched hands, Deep tendon reflexes normal in all four extremities.  CMP ( most recent) CMP     Component Value Date/Time   NA 142 06/15/2019 1232   K 4.3 06/15/2019 1232   CL 103 06/15/2019 1232   CO2 29 06/15/2019 1232   GLUCOSE 91 06/15/2019 1232   BUN 17 06/15/2019 1232   CREATININE 0.79 06/15/2019 1232   CALCIUM 9.6 06/15/2019 1232   PROT 7.5 06/15/2019 1232   ALBUMIN 3.9 04/21/2017 0846   AST 27 06/15/2019 1232   ALT 26 06/15/2019 1232   ALKPHOS 49 04/21/2017 0846   BILITOT 0.4 06/15/2019 1232   GFRNONAA 76 06/15/2019 1232   GFRAA 89 06/15/2019 1232     Diabetic Labs (most recent): Lab Results  Component Value Date   HGBA1C 5.6 06/15/2019   HGBA1C 5.8 (H) 12/15/2018   HGBA1C 5.8 (H) 04/15/2018     Lipid Panel ( most recent) Lipid Panel     Component Value Date/Time   CHOL 188 06/15/2019 1232   TRIG 143 06/15/2019 1232   HDL 59 06/15/2019 1232   CHOLHDL 3.2 06/15/2019 1232   VLDL 13 04/21/2017 0846   LDLCALC 105 (H) 06/15/2019 1232     Last ultrasound of the thyroid from 05/02/2017 showed right lobe 4.2 cm previously 4.2 cm, left lobe 3.2 cm previously 4.3 cm. There is a 1 cm nodule on the lower part of the left lobe of the thyroid. This nodule was reported to be stable from prior  studies.  Results for SAMMANTHA, RABIDEAU (MRN [id number]) as of 09/30/2018 12:03  Ref. Range 09/22/2017 12:20 03/20/2018 11:39 09/22/2018 09:48  TSH Latest Ref Range: 0.40 - 4.50 mIU/L 3.84 4.35 3.47  T4,Free(Direct) Latest Ref Range: 0.8 - 1.8 ng/dL 1.3 1.3 1.2  Thyroglobulin Ab Latest Ref Range: < or = 1 IU/mL <1    Thyroperoxidase Ab SerPl-aCnc Latest Ref Range: <9 IU/mL 869 (H)       Assessment & Plan:   1. Hypothyroidism due to Hashimoto's thyroiditis  -Her previsit thyroid function tests are consistent with appropriate replacement.  She is advised to continue levothyroxine 100 mcg p.o. daily before breakfast.     - We discussed about the correct intake of her thyroid hormone, on empty stomach at fasting, with water, separated by at least 30 minutes from breakfast and other medications,  and separated by more than 4 hours from calcium, iron, multivitamins, acid reflux medications (PPIs). -Patient is made aware of the fact that thyroid hormone replacement is needed for life, dose to be adjusted by periodic monitoring of thyroid function tests.   2. Nodular goiter-  - In the EMR records, she has a series of thyroid/neck sonograms, last on in May 2018,  showing stable solitary left lobe nodule at 1 cm in maximum dimension with no suspicious features. She will not require intervention at this time. - She would be considered for surveillance thyroid/neck ultrasound before her next visit in 6 months.  3.  Prediabetes -Her most recent A1c was 5.6% improving from 5.8%.  She is not on any medication. - she  admits there is a room for improvement in her diet and drink choices. -  Suggestion is made for her to avoid simple carbohydrates  from her diet including Cakes, Sweet Desserts / Pastries, Ice Cream, Soda (diet and regular), Sweet Tea, Candies, Chips, Cookies, Sweet Pastries,  Store Bought Juices, Alcohol in Excess of  1-2 drinks a day, Artificial Sweeteners, Coffee Creamer, and  "Sugar-free" Products. This will help patient to have stable blood glucose profile and potentially avoid unintended weight gain.  - I advised patient to maintain close follow up with Fayrene Helper, MD for primary care needs.     - Time spent on this patient care encounter:  25 minutes of which 50% was spent in  counseling and the rest reviewing  her current and  previous labs / studies and medications  doses and developing a plan for long term care. Niko V Heckler  participated in the discussions, expressed understanding, and voiced agreement with the above plans.  All questions were answered to her satisfaction. she is encouraged to contact clinic should she have any questions or concerns prior to her return visit.  Follow up plan: Return in about 6 months (around 10/14/2020) for Follow up with Pre-visit Labs, Thyroid / Neck Ultrasound.  Glade Lloyd, MD Phone: [phone]  Fax: [phone]  -  This note was partially dictated with voice recognition software. Similar sounding words can be transcribed inadequately or may not  be corrected upon review.  04/13/2020, 3:47 PM

## 2020-04-19 ENCOUNTER — Other Ambulatory Visit: Payer: Self-pay | Admitting: Family Medicine

## 2020-05-17 ENCOUNTER — Other Ambulatory Visit: Payer: Self-pay | Admitting: Family Medicine

## 2020-05-17 ENCOUNTER — Telehealth: Payer: Self-pay

## 2020-05-17 NOTE — Telephone Encounter (Signed)
Called to get more information about when the flare started and where she was hurting. Left message to call back

## 2020-05-17 NOTE — Telephone Encounter (Signed)
Pt said that Dr Moshe Cipro told her to call if she wanted something for pain, prior to the pandemic.  I advised we needed an office visit, she asked that I leave a msg.

## 2020-05-18 NOTE — Telephone Encounter (Signed)
Called patient and left message for them to return call at the office   

## 2020-05-23 NOTE — Telephone Encounter (Signed)
Had called asking for pain medicine and was never able to get her on the phone. If she calls back or you get her she needs an ov

## 2020-06-19 ENCOUNTER — Encounter: Payer: Self-pay | Admitting: Family Medicine

## 2020-06-19 ENCOUNTER — Ambulatory Visit (INDEPENDENT_AMBULATORY_CARE_PROVIDER_SITE_OTHER): Payer: Medicare Other | Admitting: Family Medicine

## 2020-06-19 ENCOUNTER — Other Ambulatory Visit: Payer: Self-pay

## 2020-06-19 VITALS — BP 127/88 | HR 79 | Temp 97.2°F | Resp 16 | Ht 67.0 in | Wt 208.1 lb

## 2020-06-19 DIAGNOSIS — M542 Cervicalgia: Secondary | ICD-10-CM | POA: Diagnosis not present

## 2020-06-19 DIAGNOSIS — E559 Vitamin D deficiency, unspecified: Secondary | ICD-10-CM

## 2020-06-19 DIAGNOSIS — Z9109 Other allergy status, other than to drugs and biological substances: Secondary | ICD-10-CM

## 2020-06-19 DIAGNOSIS — F341 Dysthymic disorder: Secondary | ICD-10-CM

## 2020-06-19 DIAGNOSIS — E785 Hyperlipidemia, unspecified: Secondary | ICD-10-CM | POA: Diagnosis not present

## 2020-06-19 DIAGNOSIS — E669 Obesity, unspecified: Secondary | ICD-10-CM

## 2020-06-19 DIAGNOSIS — I10 Essential (primary) hypertension: Secondary | ICD-10-CM

## 2020-06-19 DIAGNOSIS — Z780 Asymptomatic menopausal state: Secondary | ICD-10-CM | POA: Diagnosis not present

## 2020-06-19 DIAGNOSIS — E038 Other specified hypothyroidism: Secondary | ICD-10-CM

## 2020-06-19 MED ORDER — METHYLPREDNISOLONE ACETATE 80 MG/ML IJ SUSP
80.0000 mg | Freq: Once | INTRAMUSCULAR | Status: AC
  Administered 2020-06-19: 80 mg via INTRAMUSCULAR

## 2020-06-19 MED ORDER — KETOROLAC TROMETHAMINE 60 MG/2ML IM SOLN
60.0000 mg | Freq: Once | INTRAMUSCULAR | Status: AC
  Administered 2020-06-19: 60 mg via INTRAMUSCULAR

## 2020-06-19 MED ORDER — GABAPENTIN 100 MG PO CAPS: 100.0000 mg | ORAL_CAPSULE | Freq: Every day | ORAL | 3 refills | Status: DC

## 2020-06-19 NOTE — Progress Notes (Signed)
[name]     MRN: [id number]      DOB: Nov 17, 1951   HPI [name] is here for follow up and re-evaluation of chronic medical conditions, medication management and review of any available recent lab and radiology data.  Preventive health is updated, specifically  Cancer screening and Immunization.    The PT denies any adverse reactions to current medications since the last visit.  C/o increased and uncontrolled right hip pain in past 1 month, requests shot , also right neck pain radiating down to right shoulder C/o increased sinus drainage , clear , which often occurs during Summer months, no fever, chills or green drainge  ROS Denies recent fever or chills.  ,denies ear pain or sore throat. Denies chest congestion, productive cough or wheezing. Denies chest pains, palpitations and leg swelling Denies abdominal pain, nausea, vomiting,diarrhea or constipation.   Denies dysuria, frequency, hesitancy or incontinence.  Denies headaches, seizures, numbness, or tingling. Denies depression, anxiety or insomnia. Denies skin break down or rash.   PE  BP 127/88 (BP Location: Right Arm, Patient Position: Sitting, Cuff Size: Normal)   Pulse 79   Temp (!) 97.2 F (36.2 C) (Temporal)   Resp 16   Ht 5\' 7"  (1.702 m)   Wt 208 lb 1.9 oz (94.4 kg)   SpO2 91%   BMI 32.60 kg/m   Patient alert and oriented and in no cardiopulmonary distress.  HEENT: No facial asymmetry, EOMI,     Neck decreased ROM with spasm .Npo sinus tenderness  Chest: Clear to auscultation bilaterally.  CVS: S1, S2 no murmurs, no S3.Regular rate.  ABD: Soft non tender.   Ext: No edema  HY:[id number]  ROM lumbar  spine, and right  hips and adequate in shoulders and  knees.  Skin: Intact, no ulcerations or rash noted.  Psych:  intact not anxious or depressed appearing.  CNS: CN 2-12 intact, power,  normal throughout.no focal deficits noted.   Assessment & Plan HIP PAIN, RIGHT Uncontrolled.Toradol  and depo medrol administered IM in the office ,   Essential hypertension Controlled, no change in medication DASH diet and commitment to daily physical activity for a minimum of 30 minutes discussed and encouraged, as a part of hypertension management. The importance of attaining a healthy weight is also discussed.  BP/Weight 06/19/2020 04/13/2020 09/23/2019 06/15/2019 04/22/2019 12/15/2018 29/52/8413  Systolic BP 244 010 272 536 644 034 742  Diastolic BP 88 82 72 72 80 80 80  Wt. (Lbs) 208.12 215 202 202 194 200 194  BMI 32.6 34.7 32.6 32.6 31.31 32.28 31.31       Obesity (BMI 30.0-34.9)  Patient re-educated about  the importance of commitment to a  minimum of 150 minutes of exercise per week as able.  The importance of healthy food choices with portion control discussed, as well as eating regularly and within a 12 hour window most days. The need to choose "clean , green" food 50 to 75% of the time is discussed, as well as to make water the primary drink and set a goal of 64 ounces water daily.    Weight /BMI 06/19/2020 04/13/2020 09/23/2019  WEIGHT 208 lb 1.9 oz 215 lb 202 lb  HEIGHT 5\' 7"  5\' 6"  5\' 6"   BMI 32.6 kg/m2 34.7 kg/m2 32.6 kg/m2      Multiple environmental allergies  no change in medication, currently experiencing increased symptoms , which is not unusual, no additional medication at this time   Hypothyroidism Controlled, no  change in medication   Depression Controlled, no change in medication   Neck pain on right side Increased and uncontrolled, toradol and depomedrol also dg C spine. Use gabapentin at bedtime for pain

## 2020-06-19 NOTE — Assessment & Plan Note (Signed)
Uncontrolled.Toradol and depo medrol administered IM in the office , 

## 2020-06-19 NOTE — Patient Instructions (Addendum)
Follow-up in office with MD in 4 months call if you need me sooner.  Nurse to schedule mammogram at checkout.  Bone density test to be scheduled.  X-ray of your neck is ordered please get this at your earliest convenience.  Fasting blood work today.  Lipid panel CMP and EGFR CBC and vitamin D level.  Toradol 60 mg IM and Depo-Medrol 80 mg IM in the office today for right hip and right neck pain.  Stop muscle relaxant Flexeril as well as the nabumetone as they are ineffective.  As far as pain control is concerned  New for arthritic and nerve pain is gabapentin 100 mg at bedtime.  Also take extra strength Tylenol or Tylenol arthritis 1 to 2 tablets every day.  Think about what you will eat, plan ahead. Choose " clean, green, fresh or frozen" over canned, processed or packaged foods which are more sugary, salty and fatty. 70 to 75% of food eaten should be vegetables and fruit. Three meals at set times with snacks allowed between meals, but they must be fruit or vegetables. Aim to eat over a 12 hour period , example 7 am to 7 pm, and STOP after  your last meal of the day. Drink water,generally about 64 ounces per day, no other drink is as healthy. Fruit juice is best enjoyed in a healthy way, by EATING the fruit. Thanks for choosing [facility], we consider it a privelige to serve you.  

## 2020-06-20 LAB — CBC
HCT: 39.4 % (ref 35.0–45.0)
Hemoglobin: 13.1 g/dL (ref 11.7–15.5)
MCH: 31.9 pg (ref 27.0–33.0)
MCHC: 33.2 g/dL (ref 32.0–36.0)
MCV: 95.9 fL (ref 80.0–100.0)
MPV: 13.2 fL — ABNORMAL HIGH (ref 7.5–12.5)
Platelets: 243 10*3/uL (ref 140–400)
RBC: 4.11 10*6/uL (ref 3.80–5.10)
RDW: 12.3 % (ref 11.0–15.0)
WBC: 7.9 10*3/uL (ref 3.8–10.8)

## 2020-06-20 LAB — COMPLETE METABOLIC PANEL WITH GFR
AG Ratio: 1.2 (calc) (ref 1.0–2.5)
ALT: 35 U/L — ABNORMAL HIGH (ref 6–29)
AST: 34 U/L (ref 10–35)
Albumin: 4.1 g/dL (ref 3.6–5.1)
Alkaline phosphatase (APISO): 53 U/L (ref 37–153)
BUN/Creatinine Ratio: 22 (calc) (ref 6–22)
BUN: 21 mg/dL (ref 7–25)
CO2: 24 mmol/L (ref 20–32)
Calcium: 9.6 mg/dL (ref 8.6–10.4)
Chloride: 104 mmol/L (ref 98–110)
Creat: 0.96 mg/dL — ABNORMAL HIGH (ref 0.60–0.93)
GFR, Est African American: 69 mL/min/{1.73_m2} (ref >=60)
GFR, Est Non African American: 60 mL/min/{1.73_m2} (ref >=60)
Globulin: 3.5 g/dL (calc) (ref 1.9–3.7)
Glucose, Bld: 94 mg/dL (ref 65–99)
Potassium: 4.4 mmol/L (ref 3.5–5.3)
Sodium: 142 mmol/L (ref 135–146)
Total Bilirubin: 0.5 mg/dL (ref 0.2–1.2)
Total Protein: 7.6 g/dL (ref 6.1–8.1)

## 2020-06-20 LAB — LIPID PANEL
Cholesterol: 194 mg/dL (ref <200)
HDL: 59 mg/dL (ref >=50)
LDL Cholesterol (Calc): 110 mg/dL (calc) — ABNORMAL HIGH
Non-HDL Cholesterol (Calc): 135 mg/dL (calc) — ABNORMAL HIGH (ref <130)
Total CHOL/HDL Ratio: 3.3 (calc) (ref <5.0)
Triglycerides: 137 mg/dL (ref <150)

## 2020-06-20 LAB — VITAMIN D 25 HYDROXY (VIT D DEFICIENCY, FRACTURES): Vit D, 25-Hydroxy: 29 ng/mL — ABNORMAL LOW (ref 30–100)

## 2020-06-21 ENCOUNTER — Other Ambulatory Visit: Payer: Self-pay | Admitting: Family Medicine

## 2020-06-21 DIAGNOSIS — Z1231 Encounter for screening mammogram for malignant neoplasm of breast: Secondary | ICD-10-CM

## 2020-06-23 ENCOUNTER — Encounter: Payer: Self-pay | Admitting: Family Medicine

## 2020-06-23 DIAGNOSIS — M542 Cervicalgia: Secondary | ICD-10-CM | POA: Insufficient documentation

## 2020-06-23 NOTE — Assessment & Plan Note (Addendum)
no change in medication, currently experiencing increased symptoms , which is not unusual, no additional medication at this time

## 2020-06-23 NOTE — Assessment & Plan Note (Signed)
  Patient re-educated about  the importance of commitment to a  minimum of 150 minutes of exercise per week as able.  The importance of healthy food choices with portion control discussed, as well as eating regularly and within a 12 hour window most days. The need to choose "clean , green" food 50 to 75% of the time is discussed, as well as to make water the primary drink and set a goal of 64 ounces water daily.    Weight /BMI 06/19/2020 04/13/2020 09/23/2019  WEIGHT 208 lb 1.9 oz 215 lb 202 lb  HEIGHT 5\' 7"  5\' 6"  5\' 6"   BMI 32.6 kg/m2 34.7 kg/m2 32.6 kg/m2

## 2020-06-23 NOTE — Assessment & Plan Note (Signed)
Increased and uncontrolled, toradol and depomedrol also dg C spine. Use gabapentin at bedtime for pain

## 2020-06-23 NOTE — Assessment & Plan Note (Signed)
Controlled, no change in medication DASH diet and commitment to daily physical activity for a minimum of 30 minutes discussed and encouraged, as a part of hypertension management. The importance of attaining a healthy weight is also discussed.  BP/Weight 06/19/2020 04/13/2020 09/23/2019 06/15/2019 04/22/2019 12/15/2018 87/18/3672  Systolic BP 550 016 429 037 955 831 674  Diastolic BP 88 82 72 72 80 80 80  Wt. (Lbs) 208.12 215 202 202 194 200 194  BMI 32.6 34.7 32.6 32.6 31.31 32.28 31.31

## 2020-06-23 NOTE — Assessment & Plan Note (Signed)
Controlled, no change in medication  

## 2020-07-04 ENCOUNTER — Ambulatory Visit (HOSPITAL_COMMUNITY)
Admission: RE | Admit: 2020-07-04 | Discharge: 2020-07-04 | Disposition: A | Discharge status: Home / Self Care | Payer: Medicare Other | Source: Ambulatory Visit | Attending: Family Medicine | Admitting: Family Medicine

## 2020-07-04 ENCOUNTER — Other Ambulatory Visit: Payer: Self-pay

## 2020-07-04 DIAGNOSIS — Z780 Asymptomatic menopausal state: Secondary | ICD-10-CM | POA: Diagnosis not present

## 2020-07-07 ENCOUNTER — Other Ambulatory Visit: Payer: Self-pay | Admitting: Family Medicine

## 2020-07-07 ENCOUNTER — Other Ambulatory Visit: Payer: Self-pay

## 2020-07-07 MED ORDER — ALENDRONATE SODIUM 70 MG PO TABS
70.0000 mg | ORAL_TABLET | ORAL | 11 refills | Status: DC
Start: 2020-07-07 — End: 2021-05-21

## 2020-07-07 MED ORDER — CALCIUM CARBONATE-VITAMIN D 500-400 MG-UNIT PO TABS: 1.0000 | ORAL_TABLET | Freq: Two times a day (BID) | ORAL | 11 refills | Status: AC

## 2020-07-07 NOTE — Progress Notes (Signed)
Patient aware of the results and meds sent in..  Wants to know if you want her to keep taking the otc vitamin D 2000 daily as well as the oscal 500/400. Please advise

## 2020-07-07 NOTE — Progress Notes (Unsigned)
Needs weekly fosamax 70 mfg once weekly # 4 refill 11, and oscal d 500/400 take one tablet twice daily # 60 refill 11,has osteopenia, please order , thanks

## 2020-07-07 NOTE — Progress Notes (Signed)
Called patient and left message for them to return call at the office   

## 2020-07-08 NOTE — Progress Notes (Signed)
Yes I recommend boTH

## 2020-07-10 NOTE — Progress Notes (Signed)
LVM for pt to call the office.

## 2020-07-11 NOTE — Progress Notes (Signed)
Told her to continue taking both and we would call her if she needed to not take them both together

## 2020-07-14 ENCOUNTER — Ambulatory Visit
Admission: RE | Admit: 2020-07-14 | Discharge: 2020-07-14 | Disposition: A | Discharge status: Home / Self Care | Payer: Medicare Other | Source: Ambulatory Visit | Attending: Family Medicine | Admitting: Family Medicine

## 2020-07-14 ENCOUNTER — Other Ambulatory Visit: Payer: Self-pay

## 2020-07-14 DIAGNOSIS — Z1231 Encounter for screening mammogram for malignant neoplasm of breast: Secondary | ICD-10-CM

## 2020-07-21 ENCOUNTER — Other Ambulatory Visit: Payer: Self-pay | Admitting: "Endocrinology

## 2020-07-21 ENCOUNTER — Other Ambulatory Visit: Payer: Self-pay | Admitting: Family Medicine

## 2020-08-18 ENCOUNTER — Ambulatory Visit (INDEPENDENT_AMBULATORY_CARE_PROVIDER_SITE_OTHER): Payer: Medicare Other

## 2020-08-18 ENCOUNTER — Other Ambulatory Visit: Payer: Self-pay

## 2020-08-18 DIAGNOSIS — Z23 Encounter for immunization: Secondary | ICD-10-CM

## 2020-08-22 ENCOUNTER — Other Ambulatory Visit: Payer: Self-pay | Admitting: Family Medicine

## 2020-10-06 LAB — TSH: TSH: 2.61 mIU/L (ref 0.40–4.50)

## 2020-10-06 LAB — T4, FREE: Free T4: 1.3 ng/dL (ref 0.8–1.8)

## 2020-10-11 ENCOUNTER — Other Ambulatory Visit: Payer: Self-pay

## 2020-10-11 ENCOUNTER — Ambulatory Visit (HOSPITAL_COMMUNITY)
Admission: RE | Admit: 2020-10-11 | Discharge: 2020-10-11 | Disposition: A | Discharge status: Home / Self Care | Payer: Medicare Other | Source: Ambulatory Visit | Attending: "Endocrinology | Admitting: "Endocrinology

## 2020-10-11 DIAGNOSIS — E049 Nontoxic goiter, unspecified: Secondary | ICD-10-CM | POA: Diagnosis present

## 2020-10-18 ENCOUNTER — Encounter: Payer: Self-pay | Admitting: "Endocrinology

## 2020-10-18 ENCOUNTER — Ambulatory Visit: Payer: Medicare Other | Admitting: "Endocrinology

## 2020-10-18 ENCOUNTER — Ambulatory Visit (INDEPENDENT_AMBULATORY_CARE_PROVIDER_SITE_OTHER): Payer: Medicare Other | Admitting: "Endocrinology

## 2020-10-18 ENCOUNTER — Other Ambulatory Visit: Payer: Self-pay

## 2020-10-18 VITALS — BP 130/79 | HR 72 | Ht 67.0 in | Wt 203.0 lb

## 2020-10-18 DIAGNOSIS — R7303 Prediabetes: Secondary | ICD-10-CM

## 2020-10-18 DIAGNOSIS — E049 Nontoxic goiter, unspecified: Secondary | ICD-10-CM

## 2020-10-18 DIAGNOSIS — E039 Hypothyroidism, unspecified: Secondary | ICD-10-CM | POA: Diagnosis not present

## 2020-10-18 MED ORDER — LEVOTHYROXINE SODIUM 100 MCG PO TABS: 100.0000 ug | ORAL_TABLET | Freq: Every day | ORAL | 1 refills | Status: DC

## 2020-10-18 NOTE — Progress Notes (Signed)
10/18/2020  Endocrinology follow-up note   Subjective:    Patient ID: [name], female    DOB: 01-Jan-1951, PCP Fayrene Helper, MD   Past Medical History:  Diagnosis Date  . Allergy   . Anxiety   . Arthritis   . Asthma   . Hyperlipidemia   . Hypertension   . Vision abnormalities    states close to blindness due to cataracts   Past Surgical History:  Procedure Laterality Date  . caesarean    . cataracts    . CESAREAN SECTION    . TOTAL ABDOMINAL HYSTERECTOMY     prolapse   Social History   Socioeconomic History  . Marital status: Single    Spouse name: Not on file  . Number of children: Not on file  . Years of education: 12th grade  . Highest education level: Not on file  Occupational History  . Occupation: disabled    Fish farm manager: NOT EMPLOYED  Tobacco Use  . Smoking status: Former Smoker    Packs/day: 0.50    Years: 18.00    Pack years: 9.00    Quit date: 1989    Years since quitting: 32.8  . Smokeless tobacco: Never Used  Vaping Use  . Vaping Use: Never used  Substance and Sexual Activity  . Alcohol use: No  . Drug use: No  . Sexual activity: Not Currently  Other Topics Concern  . Not on file  Social History Narrative  . Not on file   Social Determinants of Health   Financial Resource Strain:   . Difficulty of Paying Living Expenses: Not on file  Food Insecurity:   . Worried About Charity fundraiser in the Last Year: Not on file  . Ran Out of Food in the Last Year: Not on file  Transportation Needs:   . Lack of Transportation (Medical): Not on file  . Lack of Transportation (Non-Medical): Not on file  Physical Activity:   . Days of Exercise per Week: Not on file  . Minutes of Exercise per Session: Not on file  Stress:   . Feeling of Stress : Not on file  Social Connections:   . Frequency of Communication with Friends and Family: Not on file  . Frequency of Social Gatherings with Friends and Family: Not on file  . Attends  Religious Services: Not on file  . Active Member of Clubs or Organizations: Not on file  . Attends Archivist Meetings: Not on file  . Marital Status: Not on file   Outpatient Encounter Medications as of 10/18/2020  Medication Sig  . acetaminophen (TYLENOL) 650 MG CR tablet Take 650 mg by mouth as needed for pain.  Marland Kitchen alendronate (FOSAMAX) 70 MG tablet Take 1 tablet (70 mg total) by mouth every 7 (seven) days. Take with a full glass of water on an empty stomach.  Marland Kitchen amLODipine (NORVASC) 10 MG tablet TAKE 1 TABLET BY MOUTH AT BEDTIME.  . betamethasone dipropionate (DIPROLENE) 0.05 % cream Apply topically 2 (two) times daily. Apply twice daily to rash on left elbow for 7 days, then as needed  . calcium-vitamin D (OSCAL-500) 500-400 MG-UNIT tablet Take 1 tablet by mouth 2 (two) times daily.  . cetirizine (ZYRTEC) 10 MG tablet TAKE 1 TABLET BY MOUTH TWICE DAILY.  Marland Kitchen Cholecalciferol (VITAMIN D3) 50 MCG (2000 UT) capsule Take 2,000 Units by mouth daily.  . fluticasone (FLONASE) 50 MCG/ACT nasal spray USE 1 SPRAY IN EACH NOSTRIL DAILY.  Marland Kitchen gabapentin (  NEURONTIN) 100 MG capsule Take 1 capsule (100 mg total) by mouth at bedtime.  Marland Kitchen levothyroxine (SYNTHROID) 100 MCG tablet Take 1 tablet (100 mcg total) by mouth daily.  Marland Kitchen lovastatin (MEVACOR) 40 MG tablet TAKE (1) TABLET BY MOUTH AT BEDTIME.  . montelukast (SINGULAIR) 10 MG tablet TAKE (1) TABLET BY MOUTH AT BEDTIME.  . nabumetone (RELAFEN) 750 MG tablet TAKE ONE TABLET BY MOUTH DAILY.  Marland Kitchen PARoxetine (PAXIL) 20 MG tablet TAKE ONE TABLET BY MOUTH DAILY.  . SYMBICORT 80-4.5 MCG/ACT inhaler INHALE 2 PUFFS INTO THE LUNGS TWICE DAILY.  Marland Kitchen triamterene-hydrochlorothiazide (MAXZIDE-25) 37.5-25 MG tablet TAKE 1 TABLET BY MOUTH ONCE A DAY.  . VENTOLIN HFA 108 (90 Base) MCG/ACT inhaler INHALE 2 PUFFS INTO THE LUNGS EVERY 4 HOURS AS NEEDED FOR COUGHNG ANDWHEEZING.  . [DISCONTINUED] levothyroxine (SYNTHROID) 100 MCG tablet TAKE 1 TABLET BY MOUTH DAILY.   No  facility-administered encounter medications on file as of 10/18/2020.   ALLERGIES: Allergies  Allergen Reactions  . Lotensin Hct [Benazepril-Hydrochlorothiazide] Hives    VACCINATION STATUS: Immunization History  Administered Date(s) Administered  . Fluad Quad(high Dose 65+) 10/06/2019, 08/18/2020  . H1N1 11/15/2008  . Influenza Split 08/30/2011, 08/14/2012  . Influenza Whole 09/15/2009, 09/26/2010  . Influenza,inj,Quad PF,6+ Mos 10/18/2013, 08/25/2014, 12/13/2015, 08/05/2016, 09/22/2017, 07/28/2018  . Moderna SARS-COVID-2 Vaccination 01/16/2020, 02/16/2020  . Pneumococcal Conjugate-13 05/03/2015  . Pneumococcal Polysaccharide-23 08/05/2016  . Td 03/02/2004  . Tdap 12/12/2011  . Zoster Recombinat (Shingrix) 09/13/2019, 11/15/2019    HPI 69 year old female patient with medical history as above.  She is returning for follow-up with repeat thyroid function test for hypothyroidism, as well as surveillance thyroid ultrasound.  -She is currently on levothyroxine 100 mcg p.o. daily before breakfast.  She reports compliance with medication.  She has no new complaints today.   -She has a steady weight. - She is a poor historian accompanied by her sister.  - She denies dysphagia, shortness of breath, nor voice change. She had  a series of thyroid ultrasound studies, last one from 05/02/2017 showing 1 cm solitary nodule on the left lobe of the thyroid .  Her most recent surveillance thyroid ultrasound reveals the same finding, unchanged. - Ultrasound description of the thyroid summarized below. 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  Current Outpatient Medications:  .  acetaminophen (TYLENOL) 650 MG CR tablet, Take 650 mg by mouth as needed for pain., Disp: , Rfl:  .  alendronate (FOSAMAX) 70 MG tablet, Take 1 tablet (70 mg total) by mouth every 7 (seven) days. Take with a full glass of water on an empty stomach., Disp: 4  tablet, Rfl: 11 .  amLODipine (NORVASC) 10 MG tablet, TAKE 1 TABLET BY MOUTH AT BEDTIME., Disp: 90 tablet, Rfl: 0 .  betamethasone dipropionate (DIPROLENE) 0.05 % cream, Apply topically 2 (two) times daily. Apply twice daily to rash on left elbow for 7 days, then as needed, Disp: 45 g, Rfl: 0 .  calcium-vitamin D (OSCAL-500) 500-400 MG-UNIT tablet, Take 1 tablet by mouth 2 (two) times daily., Disp: 60 tablet, Rfl: 11 .  cetirizine (ZYRTEC) 10 MG tablet, TAKE 1 TABLET BY MOUTH TWICE DAILY., Disp: 60 tablet, Rfl: 0 .  Cholecalciferol (VITAMIN D3) 50 MCG (2000 UT) capsule, Take 2,000 Units by mouth daily., Disp: , Rfl:  .  fluticasone (FLONASE) 50 MCG/ACT nasal spray, USE 1 SPRAY IN EACH NOSTRIL DAILY., Disp: 16 g, Rfl: 0 .  gabapentin (NEURONTIN) 100 MG capsule,  Take 1 capsule (100 mg total) by mouth at bedtime., Disp: 30 capsule, Rfl: 3 .  levothyroxine (SYNTHROID) 100 MCG tablet, Take 1 tablet (100 mcg total) by mouth daily., Disp: 90 tablet, Rfl: 1 .  lovastatin (MEVACOR) 40 MG tablet, TAKE (1) TABLET BY MOUTH AT BEDTIME., Disp: 90 tablet, Rfl: 0 .  montelukast (SINGULAIR) 10 MG tablet, TAKE (1) TABLET BY MOUTH AT BEDTIME., Disp: 90 tablet, Rfl: 5 .  nabumetone (RELAFEN) 750 MG tablet, TAKE ONE TABLET BY MOUTH DAILY., Disp: 90 tablet, Rfl: 0 .  PARoxetine (PAXIL) 20 MG tablet, TAKE ONE TABLET BY MOUTH DAILY., Disp: 90 tablet, Rfl: 0 .  SYMBICORT 80-4.5 MCG/ACT inhaler, INHALE 2 PUFFS INTO THE LUNGS TWICE DAILY., Disp: 10.2 g, Rfl: 2 .  triamterene-hydrochlorothiazide (MAXZIDE-25) 37.5-25 MG tablet, TAKE 1 TABLET BY MOUTH ONCE A DAY., Disp: 90 tablet, Rfl: 0 .  VENTOLIN HFA 108 (90 Base) MCG/ACT inhaler, INHALE 2 PUFFS INTO THE LUNGS EVERY 4 HOURS AS NEEDED FOR COUGHNG ANDWHEEZING., Disp: 18 g, Rfl: 0  Past Medical History:  Diagnosis Date  . Allergy   . Anxiety   . Arthritis   . Asthma   . Hyperlipidemia   . Hypertension   . Vision abnormalities    states close to blindness due to  cataracts   Past Surgical History:  Procedure Laterality Date  . caesarean    . cataracts    . CESAREAN SECTION    . 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, no sore throat, no nodules palpated in throat, no dysphagia/odynophagia, no hoarseness Cardiovascular: no Chest Pain, no Shortness of Breath, no palpitations, no leg swelling Musculoskeletal: no muscle/joint aches Skin: no rashes Neurological: no tremors, no numbness, no tingling, no dizziness Psychiatric: no depression, no anxiety  Objective:    BP 130/79   Pulse 72   Ht 5\' 7"  (1.702 m)   Wt 203 lb (92.1 kg)   BMI 31.79 kg/m   Wt Readings from Last 3 Encounters:  10/18/20 203 lb (92.1 kg)  06/19/20 208 lb 1.9 oz (94.4 kg)  04/13/20 215 lb (97.5 kg)    Physical Exam  Constitutional:  + Obese, not in acute distress, normal state of mind.  uses her walking cane due to visual impairment. Eyes: PERRLA, EOMI, no exophthalmos ENT: moist mucous membranes, no thyromegaly, no cervical lymphadenopathy  Musculoskeletal: no gross deformities, strength intact in all four extremities Skin: moist, warm, no rashes Neurological: no tremor with outstretched hands, Deep tendon reflexes normal in all four extremities.  CMP ( most recent) CMP     Component Value Date/Time   NA 142 06/19/2020 1218   K 4.4 06/19/2020 1218   CL 104 06/19/2020 1218   CO2 24 06/19/2020 1218   GLUCOSE 94 06/19/2020 1218   BUN 21 06/19/2020 1218   CREATININE 0.96 (H) 06/19/2020 1218   CALCIUM 9.6 06/19/2020 1218   PROT 7.6 06/19/2020 1218   ALBUMIN 3.9 04/21/2017 0846   AST 34 06/19/2020 1218   ALT 35 (H) 06/19/2020 1218   ALKPHOS 49 04/21/2017 0846   BILITOT 0.5 06/19/2020 1218   GFRNONAA 60 06/19/2020 1218    GFRAA 69 06/19/2020 1218     Diabetic Labs (most recent): Lab Results  Component Value Date  HGBA1C 5.6 06/15/2019   HGBA1C 5.8 (H) 12/15/2018   HGBA1C 5.8 (H) 04/15/2018     Lipid Panel ( most recent) Lipid Panel     Component Value Date/Time   CHOL 194 06/19/2020 1218   TRIG 137 06/19/2020 1218   HDL 59 06/19/2020 1218   CHOLHDL 3.3 06/19/2020 1218   VLDL 13 04/21/2017 0846   LDLCALC 110 (H) 06/19/2020 1218     Last ultrasound of the thyroid from 05/02/2017 showed right lobe 4.2 cm previously 4.2 cm, left lobe 3.2 cm previously 4.3 cm. There is a 1 cm nodule on the lower part of the left lobe of the thyroid. This nodule was reported to be stable from prior studies.  Recent Results (from the past 2160 hour(s))  TSH     Status: None   Collection Time: 10/05/20  8:02 AM  Result Value Ref Range   TSH 2.61 0.40 - 4.50 mIU/L  T4, free     Status: None   Collection Time: 10/05/20  8:02 AM  Result Value Ref Range   Free T4 1.3 0.8 - 1.8 ng/dL    Thyroid ultrasound on October 11, 2020 No abnormal lymph nodes identified.  IMPRESSION: Stable thyroid ultrasound. Stable 1.0 cm left inferior thyroid nodule which does not meet criteria for biopsy or dedicated follow-up. This nodule appears stable since 2015 and is therefore likely benign.   Assessment & Plan:   1. Hypothyroidism due to Hashimoto's thyroiditis  -Her previsit thyroid function tests are consistent with appropriate replacement.  She is advised to continue levothyroxine 100 mcg p.o. daily before breakfast.    - We discussed about the correct intake of her thyroid hormone, on empty stomach at fasting, with water, separated by at least 30 minutes from breakfast and other medications,  and separated by more than 4 hours from calcium, iron, multivitamins, acid reflux medications (PPIs). -Patient is made aware of the fact that thyroid hormone replacement is needed for life, dose to be adjusted by periodic  monitoring of thyroid function tests.  2. Nodular goiter-  - In the EMR records, she has a series of thyroid/neck sonograms,  showing stable solitary left lobe nodule at 1 cm in maximum dimension with no suspicious features.  Her most recent surveillance thyroid ultrasound is showing the same findings with no change.  She has a documented stability with benign features since 2015.  She will not require intervention at this time.   3.  Prediabetes -Her most recent A1c was 5.6% improving from 5.8%.  She is not on any medication. - she  admits there is a room for improvement in her diet and drink choices. -  Suggestion is made for her to avoid simple carbohydrates  from her diet including Cakes, Sweet Desserts / Pastries, Ice Cream, Soda (diet and regular), Sweet Tea, Candies, Chips, Cookies, Sweet Pastries,  Store Bought Juices, Alcohol in Excess of  1-2 drinks a day, Artificial Sweeteners, Coffee Creamer, and "Sugar-free" Products. This will help patient to have stable blood glucose profile and potentially avoid unintended weight gain.   - I advised patient to maintain close follow up with Fayrene Helper, MD for primary care needs.      - Time spent on this patient care encounter:  20 minutes of which 50% was spent in  counseling and the rest reviewing  her current and  previous labs / studies and medications  doses and developing a plan for long term care. [name]  participated  in the discussions, expressed understanding, and voiced agreement with the above plans.  All questions were answered to her satisfaction. she is encouraged to contact clinic should she have any questions or concerns prior to her return visit.   Follow up plan: Return in about 6 months (around 04/17/2021) for F/U with Pre-visit Labs, A1c -NV.  Glade Lloyd, MD Phone: [phone]  Fax: [phone]  -  This note was partially dictated with voice recognition software. Similar sounding words can be  transcribed inadequately or may not  be corrected upon review.  10/18/2020, 9:20 AM

## 2020-10-18 NOTE — Patient Instructions (Signed)

## 2020-10-19 ENCOUNTER — Ambulatory Visit: Payer: Medicare Other | Admitting: "Endocrinology

## 2020-10-24 ENCOUNTER — Ambulatory Visit: Payer: Medicare Other | Admitting: Family Medicine

## 2020-10-25 ENCOUNTER — Other Ambulatory Visit: Payer: Self-pay | Admitting: Family Medicine

## 2020-11-08 ENCOUNTER — Other Ambulatory Visit: Payer: Self-pay

## 2020-11-08 ENCOUNTER — Ambulatory Visit (INDEPENDENT_AMBULATORY_CARE_PROVIDER_SITE_OTHER): Payer: Medicare Other | Admitting: Family Medicine

## 2020-11-08 ENCOUNTER — Encounter: Payer: Self-pay | Admitting: Family Medicine

## 2020-11-08 VITALS — BP 132/84 | HR 70 | Resp 16 | Ht 66.5 in | Wt 200.0 lb

## 2020-11-08 DIAGNOSIS — M549 Dorsalgia, unspecified: Secondary | ICD-10-CM | POA: Insufficient documentation

## 2020-11-08 DIAGNOSIS — E785 Hyperlipidemia, unspecified: Secondary | ICD-10-CM

## 2020-11-08 DIAGNOSIS — M5410 Radiculopathy, site unspecified: Secondary | ICD-10-CM

## 2020-11-08 DIAGNOSIS — I10 Essential (primary) hypertension: Secondary | ICD-10-CM | POA: Diagnosis not present

## 2020-11-08 DIAGNOSIS — E559 Vitamin D deficiency, unspecified: Secondary | ICD-10-CM

## 2020-11-08 DIAGNOSIS — H548 Legal blindness, as defined in USA: Secondary | ICD-10-CM

## 2020-11-08 DIAGNOSIS — J45991 Cough variant asthma: Secondary | ICD-10-CM

## 2020-11-08 DIAGNOSIS — E038 Other specified hypothyroidism: Secondary | ICD-10-CM | POA: Diagnosis not present

## 2020-11-08 DIAGNOSIS — R350 Frequency of micturition: Secondary | ICD-10-CM

## 2020-11-08 DIAGNOSIS — F324 Major depressive disorder, single episode, in partial remission: Secondary | ICD-10-CM

## 2020-11-08 LAB — POCT URINALYSIS DIP (CLINITEK)
Bilirubin, UA: NEGATIVE
Blood, UA: NEGATIVE
Glucose, UA: NEGATIVE mg/dL
Ketones, POC UA: NEGATIVE mg/dL
Leukocytes, UA: NEGATIVE
Nitrite, UA: NEGATIVE
POC PROTEIN,UA: NEGATIVE
Spec Grav, UA: 1.025 (ref 1.010–1.025)
Urobilinogen, UA: 0.2 E.U./dL
pH, UA: 7 (ref 5.0–8.0)

## 2020-11-08 MED ORDER — METHYLPREDNISOLONE ACETATE 80 MG/ML IJ SUSP
80.0000 mg | Freq: Once | INTRAMUSCULAR | Status: AC
  Administered 2020-11-08: 80 mg via INTRAMUSCULAR

## 2020-11-08 MED ORDER — FLUTICASONE PROPIONATE 50 MCG/ACT NA SUSP: 1.0000 | Freq: Every day | NASAL | 0 refills | Status: DC

## 2020-11-08 MED ORDER — BUDESONIDE-FORMOTEROL FUMARATE 80-4.5 MCG/ACT IN AERO: 2.0000 | INHALATION_SPRAY | Freq: Two times a day (BID) | RESPIRATORY_TRACT | 2 refills | Status: DC

## 2020-11-08 MED ORDER — KETOROLAC TROMETHAMINE 60 MG/2ML IM SOLN
60.0000 mg | Freq: Once | INTRAMUSCULAR | Status: AC
  Administered 2020-11-08: 60 mg via INTRAMUSCULAR

## 2020-11-08 NOTE — Patient Instructions (Addendum)
F/U In office with MD first week  in April, call if you need me before   Toradol  And depo medrol in office today for back pain.  No medication changes  Be careful not to fall  YOU DO NOT HAVE A URINARY TRACT INFECTION  Fasting lipid, cmp and EGFr , TSH and vit D end March  Thanks for choosing [facility], we consider it a privelige to serve you.

## 2020-11-08 NOTE — Progress Notes (Signed)
   [name]     MRN: [id number]      DOB: March 23, 1951   HPI [name] is here for follow up and re-evaluation of chronic medical conditions, medication management and review of any available recent lab and radiology data.  Preventive health is updated, specifically  Cancer screening and Immunization.   Questions or concerns regarding consultations or procedures which the PT has had in the interim are  addressed. The PT denies any adverse reactions to current medications since the last visit.  Needs eye appointment, less socializing due to pandemic  ROS Denies recent fever or chills. Denies sinus pressure, nasal congestion, ear pain or sore throat. Denies chest congestion, productive cough or wheezing. Denies chest pains, palpitations and leg swelling Denies abdominal pain, nausea, vomiting,diarrhea or constipation.   C/o  dysuria, frequency, no fever , chills or flank pain. Increased back pain radiating down right leg, requests injection in office. Denies headaches, seizures, numbness, or tingling. Denies uncontrolled depression, anxiety or insomnia. Denies skin break down or rash.   PE  BP (!) 147/83   Pulse 70   Resp 16   Ht 5' 6.5" (1.689 m)   Wt 200 lb (90.7 kg)   SpO2 98%   BMI 31.80 kg/m   Patient alert and oriented and in no cardiopulmonary distress.  HEENT: No facial asymmetry, EOMI,     Neck supple .  Chest: Clear to auscultation bilaterally.  CVS: S1, S2 no murmurs, no S3.Regular rate.  ABD: Soft non tender.   Ext: No edema  AX:[id number]  ROM spine, shoulders, hips and knees.  Skin: Intact, no ulcerations or rash noted.  Psych: Good eye contact, normal affect. Memory intact not anxious or depressed appearing.  CNS: CN 4-12 intact, power,  normal throughout.no focal deficits noted.   Assessment & Plan  Back pain with radiculopathy Uncontrolled.Toradol and depo medrol administered IM in the office   Urinary frequency CCUA normal . Pt  reassured  Legally blind transortation difficulty to Eye eppt , will reach out to social worker  Depression Controlled, no change in medication   Asthma, cough variant Controlled, no change in medication

## 2020-11-12 ENCOUNTER — Telehealth: Payer: Self-pay | Admitting: Family Medicine

## 2020-11-12 NOTE — Assessment & Plan Note (Signed)
Controlled, no change in medication  

## 2020-11-12 NOTE — Telephone Encounter (Signed)
At recent visit, pt reported difficulty getting to eye appt in [place], due to transportation and not having anyone to accompany her. Please get a clear understanding of her need through a phone call and reach ot s to social worker to assist as able please. Thanks ??/concerns , please let me know

## 2020-11-12 NOTE — Assessment & Plan Note (Signed)
CCUA normal . Pt reassured

## 2020-11-12 NOTE — Assessment & Plan Note (Signed)
transortation difficulty to Eye e[facility] , will reach out to Education officer, [employer]

## 2020-11-12 NOTE — Telephone Encounter (Signed)
Nabumetone  is not covered by her insurance. Need to switch to meloxicam when next due to fill, please verify if she got a 30 or 90 day supply let me know , thanks leter from ins in your box

## 2020-11-12 NOTE — Assessment & Plan Note (Signed)
Uncontrolled.Toradol and depo medrol administered IM in the office . 

## 2020-11-14 ENCOUNTER — Other Ambulatory Visit: Payer: Self-pay | Admitting: Family Medicine

## 2020-11-14 MED ORDER — MELOXICAM 7.5 MG PO TABS: 7.5000 mg | ORAL_TABLET | Freq: Every day | ORAL | 5 refills | Status: DC

## 2020-11-14 NOTE — Telephone Encounter (Signed)
Left generic message asking for call back

## 2020-11-14 NOTE — Telephone Encounter (Signed)
Patient states she never got a refill on the nabumetone. She says she was also supposed to get a cream but never got it.

## 2020-11-14 NOTE — Telephone Encounter (Signed)
Called patient to discuss. No answer, lvm 

## 2020-11-14 NOTE — Telephone Encounter (Signed)
Left generic message to call back to discuss medication.

## 2020-11-14 NOTE — Telephone Encounter (Signed)
Called patient to discuss. No answer, left vm

## 2020-11-14 NOTE — Telephone Encounter (Signed)
Spoke with patient. She stated she needs transportation to the eye doctor for a corneal transplant and she needs someone to go with her for that visit. She stated she doesn't know if she has to recertify for this or not. She will call the doctor and see what she needs to do.

## 2020-11-14 NOTE — Telephone Encounter (Signed)
Unaware of need for cream, please have her describe what is going on . I see no cream on historical review Meloxicam is sent in for arthritis pain instead of nabumatone

## 2020-11-15 ENCOUNTER — Other Ambulatory Visit: Payer: Self-pay

## 2020-11-15 DIAGNOSIS — H548 Legal blindness, as defined in USA: Secondary | ICD-10-CM

## 2020-11-15 MED ORDER — BETAMETHASONE DIPROPIONATE 0.05 % EX CREA: TOPICAL_CREAM | Freq: Two times a day (BID) | CUTANEOUS | 0 refills | Status: AC

## 2020-11-15 NOTE — Telephone Encounter (Signed)
Left vm asking patient to call back to discuss cream she is asking for

## 2020-11-17 NOTE — Telephone Encounter (Signed)
Patient needed bethamesone cream refilled. It was on her med list. Refill sent in.

## 2020-11-17 NOTE — Telephone Encounter (Signed)
Referred patient to [facility]. They sent a referral message back stating that they are unable to assist this patient.

## 2020-11-18 NOTE — Telephone Encounter (Signed)
Noted, thanks!

## 2020-11-18 NOTE — Telephone Encounter (Signed)
Noted, pls let her know we are unable to get additional help for her to get to [place] for eye exam, my understanding is that she needs someone to be physically present at the clinic, not just plain transportation isue. Encourage her to see if she can get anyone to help her with this, family or friend

## 2020-12-18 ENCOUNTER — Other Ambulatory Visit: Payer: Self-pay | Admitting: Family Medicine

## 2021-01-20 ENCOUNTER — Other Ambulatory Visit: Payer: Self-pay | Admitting: Family Medicine

## 2021-02-19 ENCOUNTER — Other Ambulatory Visit: Payer: Self-pay | Admitting: Family Medicine

## 2021-03-14 ENCOUNTER — Ambulatory Visit: Payer: Medicare Other | Admitting: Family Medicine

## 2021-03-23 ENCOUNTER — Other Ambulatory Visit: Payer: Self-pay | Admitting: Family Medicine

## 2021-04-11 LAB — T4, FREE: Free T4: 1.52 ng/dL (ref 0.82–1.77)

## 2021-04-11 LAB — TSH: TSH: 2.56 u[IU]/mL (ref 0.450–4.500)

## 2021-04-16 ENCOUNTER — Encounter: Payer: Self-pay | Admitting: Family Medicine

## 2021-04-16 ENCOUNTER — Ambulatory Visit (INDEPENDENT_AMBULATORY_CARE_PROVIDER_SITE_OTHER): Payer: Medicare Other | Admitting: Family Medicine

## 2021-04-16 ENCOUNTER — Other Ambulatory Visit: Payer: Self-pay

## 2021-04-16 VITALS — BP 144/84 | HR 64 | Resp 15 | Ht 67.0 in | Wt 198.0 lb

## 2021-04-16 DIAGNOSIS — J0110 Acute frontal sinusitis, unspecified: Secondary | ICD-10-CM

## 2021-04-16 DIAGNOSIS — E669 Obesity, unspecified: Secondary | ICD-10-CM

## 2021-04-16 DIAGNOSIS — Z1231 Encounter for screening mammogram for malignant neoplasm of breast: Secondary | ICD-10-CM

## 2021-04-16 DIAGNOSIS — E038 Other specified hypothyroidism: Secondary | ICD-10-CM | POA: Diagnosis not present

## 2021-04-16 DIAGNOSIS — E785 Hyperlipidemia, unspecified: Secondary | ICD-10-CM

## 2021-04-16 DIAGNOSIS — I10 Essential (primary) hypertension: Secondary | ICD-10-CM

## 2021-04-16 MED ORDER — PENICILLIN V POTASSIUM 500 MG PO TABS: 500.0000 mg | ORAL_TABLET | Freq: Three times a day (TID) | ORAL | 0 refills | Status: DC

## 2021-04-16 MED ORDER — TRIAMTERENE-HCTZ 37.5-25 MG PO TABS: ORAL_TABLET | ORAL | 5 refills | Status: DC

## 2021-04-16 MED ORDER — CETIRIZINE HCL 10 MG PO TBDP: 1.0000 | ORAL_TABLET | Freq: Every day | ORAL | 5 refills | Status: DC

## 2021-04-16 NOTE — Progress Notes (Signed)
   ANOUSHKA Jenkins     MRN: [id number]      DOB: 1951-05-31   HPI [name] is here for follow up and re-evaluation of chronic medical conditions, medication management and review of any available recent lab and radiology data.  Preventive health is updated, specifically  Cancer screening and Immunization.   Questions or concerns regarding consultations or procedures which the PT has had in the interim are  addressed. The PT denies any adverse reactions to current medications since the last visit.  2 week h/o head and chest congestion, black nasal bloody drainage Depression hits in waves, poor vision, family members who have passed including her daughter who passed 3 years  ago  ROS Denies recent fever or chills.  Denies chest pains, palpitations and leg swelling Denies abdominal pain, nausea, vomiting,diarrhea or constipation.   Denies dysuria, frequency, hesitancy or incontinence. C/o  joint pain, swelling and limitation in mobility. Denies headaches, seizures, numbness, or tingling. Denies depression, anxiety or insomnia. Denies skin break down or rash.   PE  BP (!) 162/75   Pulse 64   Resp 15   Ht 5\' 7"  (1.702 m)   Wt 198 lb (89.8 kg)   SpO2 96%   BMI 31.01 kg/m   Patient alert and oriented and in no cardiopulmonary distress.  HEENT: No facial asymmetry, EOMI,     Neck supple Positive frontal sinus tendernes  Chest: Clear to auscultation bilaterally.  CVS: S1, S2 no murmurs, no S3.Regular rate.  ABD: Soft non tender.   Ext: No edema  MS: decreased ROM spine , normal in shoulders and reduced in hips  Skin: Intact, no ulcerations or rash noted.  Psych: Good eye contact, normal affect. Memory intact not anxious or depressed appearing.  CNS: CN 2-12 intact, power,  normal throughout.no focal deficits noted.   Assessment & Plan  Acute sinusitis Pen v prescribed  Essential hypertension Uncontrolled and not at goal Increase triamterene to one and a half   Daily  Review in 6 weeks  Hyperlipidemia LDL goal <100 Hyperlipidemia:Low fat diet discussed and encouraged. Updated lab needed at/ before next visit.  Lipid Panel  Lab Results  Component Value Date   CHOL 194 06/19/2020   HDL 59 06/19/2020   LDLCALC 110 (H) 06/19/2020   TRIG 137 06/19/2020   CHOLHDL 3.3 06/19/2020       Hypothyroidism Updated lab needed at/ before next visit.   Obesity (BMI 30.0-34.9)  Patient re-educated about  the importance of commitment to a  minimum of 150 minutes of exercise per week as able.  The importance of healthy food choices with portion control discussed, as well as eating regularly and within a 12 hour window most days. The need to choose "clean , green" food 50 to 75% of the time is discussed, as well as to make water the primary drink and set a goal of 64 ounces water daily.    Weight /BMI 04/16/2021 11/08/2020 10/18/2020  WEIGHT 198 lb 200 lb 203 lb  HEIGHT 5\' 7"  5' 6.5" 5\' 7"   BMI 31.01 kg/m2 31.8 kg/m2 31.79 kg/m2

## 2021-04-16 NOTE — Assessment & Plan Note (Signed)
Uncontrolled and not at goal Increase triamterene to one and a half  Daily  Review in 6 weeks

## 2021-04-16 NOTE — Assessment & Plan Note (Signed)
  Patient re-educated about  the importance of commitment to a  minimum of 150 minutes of exercise per week as able.  The importance of healthy food choices with portion control discussed, as well as eating regularly and within a 12 hour window most days. The need to choose "clean , green" food 50 to 75% of the time is discussed, as well as to make water the primary drink and set a goal of 64 ounces water daily.    Weight /BMI 04/16/2021 11/08/2020 10/18/2020  WEIGHT 198 lb 200 lb 203 lb  HEIGHT 5\' 7"  5' 6.5" 5\' 7"   BMI 31.01 kg/m2 31.8 kg/m2 31.79 kg/m2

## 2021-04-16 NOTE — Assessment & Plan Note (Addendum)
Hyperlipidemia:Low fat diet discussed and encouraged. Updated lab needed at/ before next visit.  Lipid Panel  Lab Results  Component Value Date   CHOL 194 06/19/2020   HDL 59 06/19/2020   LDLCALC 110 (H) 06/19/2020   TRIG 137 06/19/2020   CHOLHDL 3.3 06/19/2020

## 2021-04-16 NOTE — Assessment & Plan Note (Signed)
Pen v prescribed 

## 2021-04-16 NOTE — Patient Instructions (Addendum)
F/U in 2 months, re evaluate blood pressure  Increase dose of triamterene to one and a half once daily  Certrizine , for allergies is reduced to once daily as you have been doing  Please get fasting labs ordered today but NOT TSH, this is already done by dr Dorris Fetch, nurse please correct th order  Penicillin is ordered for 1 week only for sinus.  Please schedule August mammogram at checkout  Think about what you will eat, plan ahead. Choose " clean, green, fresh or frozen" over canned, processed or packaged foods which are more sugary, salty and fatty. 70 to 75% of food eaten should be vegetables and fruit. Three meals at set times with snacks allowed between meals, but they must be fruit or vegetables. Aim to eat over a 12 hour period , example 7 am to 7 pm, and STOP after  your last meal of the day. Drink water,generally about 64 ounces per day, no other drink is as healthy. Fruit juice is best enjoyed in a healthy way, by EATING the fruit. It is important that you exercise regularly at least 30 minutes 5 times a week. If you develop chest pain, have severe difficulty breathing, or feel very tired, stop exercising immediately and seek medical attention  \  Thanks for choosing [facility], we consider it a privelige to serve you.

## 2021-04-16 NOTE — Assessment & Plan Note (Signed)
Updated lab needed at/ before next visit.   

## 2021-04-17 ENCOUNTER — Encounter: Payer: Self-pay | Admitting: "Endocrinology

## 2021-04-17 ENCOUNTER — Ambulatory Visit (INDEPENDENT_AMBULATORY_CARE_PROVIDER_SITE_OTHER): Payer: Medicare Other | Admitting: "Endocrinology

## 2021-04-17 VITALS — BP 134/82 | HR 80 | Ht 67.0 in | Wt 198.4 lb

## 2021-04-17 DIAGNOSIS — E039 Hypothyroidism, unspecified: Secondary | ICD-10-CM | POA: Diagnosis not present

## 2021-04-17 DIAGNOSIS — E049 Nontoxic goiter, unspecified: Secondary | ICD-10-CM

## 2021-04-17 DIAGNOSIS — R7303 Prediabetes: Secondary | ICD-10-CM | POA: Diagnosis not present

## 2021-04-17 LAB — CMP14+EGFR
ALT: 32 IU/L (ref 0–32)
AST: 28 IU/L (ref 0–40)
Albumin/Globulin Ratio: 1.4 (ref 1.2–2.2)
Albumin: 4.4 g/dL (ref 3.8–4.8)
Alkaline Phosphatase: 47 IU/L (ref 44–121)
BUN/Creatinine Ratio: 23 (ref 12–28)
BUN: 18 mg/dL (ref 8–27)
Bilirubin Total: 0.3 mg/dL (ref 0.0–1.2)
CO2: 22 mmol/L (ref 20–29)
Calcium: 9.2 mg/dL (ref 8.7–10.3)
Chloride: 107 mmol/L — ABNORMAL HIGH (ref 96–106)
Creatinine, Ser: 0.79 mg/dL (ref 0.57–1.00)
Globulin, Total: 3.2 g/dL (ref 1.5–4.5)
Glucose: 95 mg/dL (ref 65–99)
Potassium: 4.4 mmol/L (ref 3.5–5.2)
Sodium: 143 mmol/L (ref 134–144)
Total Protein: 7.6 g/dL (ref 6.0–8.5)
eGFR: 80 mL/min/{1.73_m2} (ref >59)

## 2021-04-17 LAB — POCT GLYCOSYLATED HEMOGLOBIN (HGB A1C): HbA1c, POC (controlled diabetic range): 5.7 % (ref 0.0–7.0)

## 2021-04-17 LAB — LIPID PANEL
Chol/HDL Ratio: 2.5 ratio (ref 0.0–4.4)
Cholesterol, Total: 186 mg/dL (ref 100–199)
HDL: 74 mg/dL (ref >39)
LDL Chol Calc (NIH): 95 mg/dL (ref 0–99)
Triglycerides: 93 mg/dL (ref 0–149)
VLDL Cholesterol Cal: 17 mg/dL (ref 5–40)

## 2021-04-17 LAB — VITAMIN D 25 HYDROXY (VIT D DEFICIENCY, FRACTURES): Vit D, 25-Hydroxy: 30.4 ng/mL (ref 30.0–100.0)

## 2021-04-17 MED ORDER — LEVOTHYROXINE SODIUM 100 MCG PO TABS: 100.0000 ug | ORAL_TABLET | Freq: Every day | ORAL | 3 refills | Status: DC

## 2021-04-17 NOTE — Patient Instructions (Signed)

## 2021-04-17 NOTE — Progress Notes (Signed)
04/17/2021  Endocrinology follow-up note   Subjective:    Patient ID: [name], female    DOB: [birth year], PCP Fayrene Helper, MD   Past Medical History:  Diagnosis Date  . Allergy   . Anxiety   . Arthritis   . Asthma   . Hyperlipidemia   . Hypertension   . Vision abnormalities    states close to blindness due to cataracts   Past Surgical History:  Procedure Laterality Date  . caesarean    . cataracts    . CESAREAN SECTION    . TOTAL ABDOMINAL HYSTERECTOMY     prolapse   Social History   Socioeconomic History  . Marital status: Single    Spouse name: Not on file  . Number of children: Not on file  . Years of education: 12th grade  . Highest education level: Not on file  Occupational History  . Occupation: disabled    Fish farm manager: NOT EMPLOYED  Tobacco Use  . Smoking status: Former Smoker    Packs/day: 0.50    Years: 18.00    Pack years: 9.00    Quit date: 1989    Years since quitting: 33.3  . Smokeless tobacco: Never Used  Vaping Use  . Vaping Use: Never used  Substance and Sexual Activity  . Alcohol use: No  . Drug use: No  . Sexual activity: Not Currently  Other Topics Concern  . Not on file  Social History Narrative  . Not on file   Social Determinants of Health   Financial Resource Strain: Not on file  Food Insecurity: Not on file  Transportation Needs: Not on file  Physical Activity: Not on file  Stress: Not on file  Social Connections: Not on file   Outpatient Encounter Medications as of 04/17/2021  Medication Sig  . acetaminophen (TYLENOL) 650 MG CR tablet Take 650 mg by mouth as needed for pain.  Marland Kitchen alendronate (FOSAMAX) 70 MG tablet Take 1 tablet (70 mg total) by mouth every 7 (seven) days. Take with a full glass of water on an empty stomach.  Marland Kitchen amLODipine (NORVASC) 10 MG tablet TAKE 1 TABLET BY MOUTH AT BEDTIME.  . betamethasone dipropionate 0.05 % cream Apply topically 2 (two) times daily. Apply twice daily to rash on left  elbow for 7 days, then as needed  . calcium-vitamin D (OSCAL-500) 500-400 MG-UNIT tablet Take 1 tablet by mouth 2 (two) times daily.  . Cetirizine HCl 10 MG TBDP Take 1 tablet by mouth daily.  . Cholecalciferol (VITAMIN D3) 50 MCG (2000 UT) capsule Take 2,000 Units by mouth daily.  . fluticasone (FLONASE) 50 MCG/ACT nasal spray USE 1 SPRAY IN EACH NOSTRIL DAILY.  Marland Kitchen levothyroxine (SYNTHROID) 100 MCG tablet Take 1 tablet (100 mcg total) by mouth daily.  Marland Kitchen lovastatin (MEVACOR) 40 MG tablet TAKE (1) TABLET BY MOUTH AT BEDTIME.  . meloxicam (MOBIC) 7.5 MG tablet Take 1 tablet (7.5 mg total) by mouth daily.  . montelukast (SINGULAIR) 10 MG tablet TAKE (1) TABLET BY MOUTH AT BEDTIME.  Marland Kitchen PARoxetine (PAXIL) 20 MG tablet TAKE ONE TABLET BY MOUTH DAILY.  Marland Kitchen penicillin v potassium (VEETID) 500 MG tablet Take 1 tablet (500 mg total) by mouth 3 (three) times daily.  . SYMBICORT 80-4.5 MCG/ACT inhaler INHALE 2 PUFFS INTO THE LUNGS TWICE DAILY.  Marland Kitchen triamterene-hydrochlorothiazide (MAXZIDE-25) 37.5-25 MG tablet Take one and  A half tablets once daily for blood pressur  . VENTOLIN HFA 108 (90 Base) MCG/ACT inhaler INHALE 2 PUFFS  INTO THE LUNGS EVERY 4 HOURS AS NEEDED FOR COUGHNG ANDWHEEZING.  . [DISCONTINUED] levothyroxine (SYNTHROID) 100 MCG tablet Take 1 tablet (100 mcg total) by mouth daily.   No facility-administered encounter medications on file as of 04/17/2021.   ALLERGIES: Allergies  Allergen Reactions  . Lotensin Hct [Benazepril-Hydrochlorothiazide] Hives    VACCINATION STATUS: Immunization History  Administered Date(s) Administered  . Fluad Quad(high Dose 65+) 10/06/2019, 08/18/2020  . H1N1 11/15/2008  . Influenza Split 08/30/2011, 08/14/2012  . Influenza Whole 09/15/2009, 09/26/2010  . Influenza,inj,Quad PF,6+ Mos 10/18/2013, 08/25/2014, 12/13/2015, 08/05/2016, 09/22/2017, 07/28/2018  . Moderna Sars-Covid-2 Vaccination 01/16/2020, 02/16/2020  . Pneumococcal Conjugate-13 05/03/2015  .  Pneumococcal Polysaccharide-23 08/05/2016  . Td 03/02/2004  . Tdap 12/12/2011  . Zoster Recombinat (Shingrix) 09/13/2019, 11/15/2019    HPI 70 year old female patient with medical history as above.  She is returning for follow-up with repeat thyroid function test for follow-up of hypothyroidism, prediabetes. She is currently on stable dose of levothyroxine 100 mcg p.o. daily before breakfast.  She reports compliance with medication.  She has no new complaints today.   -She has a steady weight. - She is a poor historian accompanied by her sister.  - She denies dysphagia, shortness of breath, nor voice change. She had  a series of thyroid ultrasound studies, last one from 05/02/2017 showing 1 cm solitary nodule on the left lobe of the thyroid .  Her most recent surveillance thyroid ultrasound in 2021 reveals the same finding, unchanged. - Ultrasound description of the thyroid summarized below. 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  Current Outpatient Medications:  .  acetaminophen (TYLENOL) 650 MG CR tablet, Take 650 mg by mouth as needed for pain., Disp: , Rfl:  .  alendronate (FOSAMAX) 70 MG tablet, Take 1 tablet (70 mg total) by mouth every 7 (seven) days. Take with a full glass of water on an empty stomach., Disp: 4 tablet, Rfl: 11 .  amLODipine (NORVASC) 10 MG tablet, TAKE 1 TABLET BY MOUTH AT BEDTIME., Disp: 30 tablet, Rfl: 0 .  betamethasone dipropionate 0.05 % cream, Apply topically 2 (two) times daily. Apply twice daily to rash on left elbow for 7 days, then as needed, Disp: 45 g, Rfl: 0 .  calcium-vitamin D (OSCAL-500) 500-400 MG-UNIT tablet, Take 1 tablet by mouth 2 (two) times daily., Disp: 60 tablet, Rfl: 11 .  Cetirizine HCl 10 MG TBDP, Take 1 tablet by mouth daily., Disp: 30 tablet, Rfl: 5 .  Cholecalciferol (VITAMIN D3) 50 MCG (2000 UT) capsule, Take 2,000 Units by mouth daily., Disp: , Rfl:  .  fluticasone  (FLONASE) 50 MCG/ACT nasal spray, USE 1 SPRAY IN EACH NOSTRIL DAILY., Disp: 16 g, Rfl: 0 .  levothyroxine (SYNTHROID) 100 MCG tablet, Take 1 tablet (100 mcg total) by mouth daily., Disp: 90 tablet, Rfl: 3 .  lovastatin (MEVACOR) 40 MG tablet, TAKE (1) TABLET BY MOUTH AT BEDTIME., Disp: 30 tablet, Rfl: 0 .  meloxicam (MOBIC) 7.5 MG tablet, Take 1 tablet (7.5 mg total) by mouth daily., Disp: 30 tablet, Rfl: 5 .  montelukast (SINGULAIR) 10 MG tablet, TAKE (1) TABLET BY MOUTH AT BEDTIME., Disp: 90 tablet, Rfl: 5 .  PARoxetine (PAXIL) 20 MG tablet, TAKE ONE TABLET BY MOUTH DAILY., Disp: 30 tablet, Rfl: 0 .  penicillin v potassium (VEETID) 500 MG tablet, Take 1 tablet (500 mg total) by mouth 3 (three) times daily., Disp: 21 tablet, Rfl: 0 .  SYMBICORT 80-4.5 MCG/ACT inhaler,  INHALE 2 PUFFS INTO THE LUNGS TWICE DAILY., Disp: 10.2 g, Rfl: 0 .  triamterene-hydrochlorothiazide (MAXZIDE-25) 37.5-25 MG tablet, Take one and  A half tablets once daily for blood pressur, Disp: 45 tablet, Rfl: 5 .  VENTOLIN HFA 108 (90 Base) MCG/ACT inhaler, INHALE 2 PUFFS INTO THE LUNGS EVERY 4 HOURS AS NEEDED FOR COUGHNG ANDWHEEZING., Disp: 18 g, Rfl: 0  Past Medical History:  Diagnosis Date  . Allergy   . Anxiety   . Arthritis   . Asthma   . Hyperlipidemia   . Hypertension   . Vision abnormalities    states close to blindness due to cataracts   Past Surgical History:  Procedure Laterality Date  . caesarean    . cataracts    . CESAREAN SECTION    . TOTAL ABDOMINAL HYSTERECTOMY     prolapse   Review of Systems  Constitutional:  + Steady weight since 2021    - fatigue, no subjective hyperthermia, no subjective hypothermia Eyes: no blurry vision, no xerophthalmia ENT: + Severe visual impairment due to corneal defect, no sore throat, no nodules palpated in throat, no dysphagia/odynophagia, no hoarseness   Objective:    BP 134/82   Pulse 80   Ht 5' 7" (1.702 m)   Wt 198 lb 6.4 oz (90 kg)   BMI 31.07 kg/m    Wt Readings from Last 3 Encounters:  04/17/21 198 lb 6.4 oz (90 kg)  04/16/21 198 lb (89.8 kg)  11/08/20 200 lb (90.7 kg)    Physical Exam  Constitutional:  + Obese, not in acute distress, normal state of mind.  uses her walking cane due to visual impairment.   CMP ( most recent) CMP     Component Value Date/Time   NA 143 04/16/2021 1058   K 4.4 04/16/2021 1058   CL 107 (H) 04/16/2021 1058   CO2 22 04/16/2021 1058   GLUCOSE 95 04/16/2021 1058   GLUCOSE 94 06/19/2020 1218   BUN 18 04/16/2021 1058   CREATININE 0.79 04/16/2021 1058   CREATININE 0.96 (H) 06/19/2020 1218   CALCIUM 9.2 04/16/2021 1058   PROT 7.6 04/16/2021 1058   ALBUMIN 4.4 04/16/2021 1058   AST 28 04/16/2021 1058   ALT 32 04/16/2021 1058   ALKPHOS 47 04/16/2021 1058   BILITOT 0.3 04/16/2021 1058   GFRNONAA 60 06/19/2020 1218   GFRAA 69 06/19/2020 1218     Diabetic Labs (most recent): Lab Results  Component Value Date   HGBA1C 5.7 04/17/2021   HGBA1C 5.6 06/15/2019   HGBA1C 5.8 (H) 12/15/2018     Lipid Panel ( most recent) Lipid Panel     Component Value Date/Time   CHOL 186 04/16/2021 1058   TRIG 93 04/16/2021 1058   HDL 74 04/16/2021 1058   CHOLHDL 2.5 04/16/2021 1058   CHOLHDL 3.3 06/19/2020 1218   VLDL 13 04/21/2017 0846   LDLCALC 95 04/16/2021 1058   LDLCALC 110 (H) 06/19/2020 1218     Last ultrasound of the thyroid from 05/02/2017 showed right lobe 4.2 cm previously 4.2 cm, left lobe 3.2 cm previously 4.3 cm. There is a 1 cm nodule on the lower part of the left lobe of the thyroid. This nodule was reported to be stable from prior studies.  Recent Results (from the past 2160 hour(s))  T4, free     Status: None   Collection Time: 04/10/21  8:05 AM  Result Value Ref Range   Free T4 1.52 0.82 - 1.77 ng/dL  TSH  Status: None   Collection Time: 04/10/21  8:05 AM  Result Value Ref Range   TSH 2.560 0.450 - 4.500 uIU/mL  Lipid panel     Status: None   Collection Time: 04/16/21 10:58  AM  Result Value Ref Range   Cholesterol, Total 186 100 - 199 mg/dL   Triglycerides 93 0 - 149 mg/dL   HDL 74 >39 mg/dL   VLDL Cholesterol Cal 17 5 - 40 mg/dL   LDL Chol Calc (NIH) 95 0 - 99 mg/dL   Chol/HDL Ratio 2.5 0.0 - 4.4 ratio    Comment:                                   T. Chol/HDL Ratio                                             Men  Women                               1/2 Avg.Risk  3.4    3.3                                   Avg.Risk  5.0    4.4                                2X Avg.Risk  9.6    7.1                                3X Avg.Risk 23.4   11.0   CMP14+EGFR     Status: Abnormal   Collection Time: 04/16/21 10:58 AM  Result Value Ref Range   Glucose 95 65 - 99 mg/dL   BUN 18 8 - 27 mg/dL   Creatinine, Ser 0.79 0.57 - 1.00 mg/dL   eGFR 80 >59 mL/min/1.73   BUN/Creatinine Ratio 23 12 - 28   Sodium 143 134 - 144 mmol/L   Potassium 4.4 3.5 - 5.2 mmol/L   Chloride 107 (H) 96 - 106 mmol/L   CO2 22 20 - 29 mmol/L   Calcium 9.2 8.7 - 10.3 mg/dL   Total Protein 7.6 6.0 - 8.5 g/dL   Albumin 4.4 3.8 - 4.8 g/dL   Globulin, Total 3.2 1.5 - 4.5 g/dL   Albumin/Globulin Ratio 1.4 1.2 - 2.2   Bilirubin Total 0.3 0.0 - 1.2 mg/dL   Alkaline Phosphatase 47 44 - 121 IU/L   AST 28 0 - 40 IU/L   ALT 32 0 - 32 IU/L  VITAMIN D 25 Hydroxy (Vit-D Deficiency, Fractures)     Status: None   Collection Time: 04/16/21 10:58 AM  Result Value Ref Range   Vit D, 25-Hydroxy 30.4 30.0 - 100.0 ng/mL    Comment: Vitamin D deficiency has been defined by the Institute of Medicine and an Endocrine Society practice guideline as a level of serum 25-OH vitamin D less than 20 ng/mL (1,2). The Endocrine Society went on to further define vitamin D insufficiency as a level between 21 and 29 ng/mL (2).  1. IOM (Institute of Medicine). 2010. Dietary reference    intakes for calcium and D. [place]: The    [employer]. 2. Holick MF, Binkley Grampian, Bischoff-Ferrari HA, et al.     Evaluation, treatment, and prevention of vitamin D    deficiency: an Endocrine Society clinical practice    guideline. JCEM. 2011 Jul; 96(7):1911-30.   HgB A1c     Status: None   Collection Time: 04/17/21  8:56 AM  Result Value Ref Range   Hemoglobin A1C     HbA1c POC (<> result, manual entry)     HbA1c, POC (prediabetic range)     HbA1c, POC (controlled diabetic range) 5.7 0.0 - 7.0 %    Thyroid ultrasound on October 11, 2020 No abnormal lymph nodes identified.  IMPRESSION: Stable thyroid ultrasound. Stable 1.0 cm left inferior thyroid nodule which does not meet criteria for biopsy or dedicated follow-up. This nodule appears stable since 2015 and is therefore likely benign.   Assessment & Plan:   1. Hypothyroidism due to Hashimoto's thyroiditis  -Her previsit thyroid function tests are consistent with appropriate replacement.  She is advised to continue levothyroxine 100 mcg p.o. daily before breakfast.    - We discussed about the correct intake of her thyroid hormone, on empty stomach at fasting, with water, separated by at least 30 minutes from breakfast and other medications,  and separated by more than 4 hours from calcium, iron, multivitamins, acid reflux medications (PPIs). -Patient is made aware of the fact that thyroid hormone replacement is needed for life, dose to be adjusted by periodic monitoring of thyroid function tests.   2. Nodular goiter-  - In the EMR records, she has a series of thyroid/neck sonograms,  showing stable solitary left lobe nodule at 1 cm in maximum dimension with no suspicious features.  Her most recent surveillance thyroid ultrasound is showing the same findings with no change.  She has a documented stability with benign features since 2015.  She will not require intervention at this time.    3.  Prediabetes -Her point-of-care A1c is 5.7%, remaining stable.  She is not on any medications.    - she acknowledges that there is a room for  improvement in her food and drink choices. - Suggestion is made for her to avoid simple carbohydrates  from her diet including Cakes, Sweet Desserts, Ice Cream, Soda (diet and regular), Sweet Tea, Candies, Chips, Cookies, Store Bought Juices, Alcohol in Excess of  1-2 drinks a day, Artificial Sweeteners,  Coffee Creamer, and "Sugar-free" Products, Lemonade. This will help patient to have more stable blood glucose profile and potentially avoid unintended weight gain.     - I advised patient to maintain close follow up with Fayrene Helper, MD for primary care needs.    I spent 25 minutes in the care of the patient today including review of labs from Thyroid Function, CMP, and other relevant labs ; imaging/biopsy records (current and previous including abstractions from other facilities); face-to-face time discussing  her lab results and symptoms, medications doses, her options of short and long term treatment based on the latest standards of care / guidelines;   and documenting the encounter.  Laterrica V Mcfarland  participated in the discussions, expressed understanding, and voiced agreement with the above plans.  All questions were answered to her satisfaction. she is encouraged to contact clinic should she have any questions or concerns prior to her return visit.    Follow up plan: Return  in about 1 year (around 04/17/2022) for F/U with Pre-visit Labs, A1c -NV.  Glade Lloyd, MD Phone: [phone]  Fax: [phone]  -  This note was partially dictated with voice recognition software. Similar sounding words can be transcribed inadequately or may not  be corrected upon review.  04/17/2021, 9:48 AM

## 2021-04-18 ENCOUNTER — Telehealth: Payer: Self-pay

## 2021-04-18 NOTE — Telephone Encounter (Signed)
Patient returning call about lab results. Cb# [phone].

## 2021-04-20 ENCOUNTER — Other Ambulatory Visit: Payer: Self-pay | Admitting: Family Medicine

## 2021-05-15 ENCOUNTER — Telehealth: Payer: Self-pay | Admitting: Family Medicine

## 2021-05-15 NOTE — Telephone Encounter (Signed)
error 

## 2021-05-19 ENCOUNTER — Other Ambulatory Visit: Payer: Self-pay | Admitting: Family Medicine

## 2021-05-22 ENCOUNTER — Other Ambulatory Visit: Payer: Self-pay | Admitting: Family Medicine

## 2021-05-23 ENCOUNTER — Ambulatory Visit: Payer: Medicare Other

## 2021-05-23 ENCOUNTER — Other Ambulatory Visit: Payer: Self-pay

## 2021-05-23 NOTE — Progress Notes (Deleted)
Subjective:   [name] is a 70 y.o. female who presents for Medicare Annual (Subsequent) preventive examination.  I connected with [name] today by telephone and verified that I am speaking with the correct person using two identifiers. Location patient: home Location provider: work Persons participating in the virtual visit: patient, provider.   I discussed the limitations, risks, security and privacy concerns of performing an evaluation and management service by telephone and the availability of in person appointments. I also discussed with the patient that there may be a patient responsible charge related to this service. The patient expressed understanding and verbally consented to this telephonic visit.    Interactive audio and video telecommunications were attempted between this provider and patient, however failed, due to patient having technical difficulties OR patient did not have access to video capability.  We continued and completed visit with audio only.     Review of Systems    N/A        Objective:    There were no vitals filed for this visit. There is no height or weight on file to calculate BMI.  Advanced Directives 04/20/2018 12/30/2016 03/26/2015  Does Patient Have a Medical Advance Directive? No No No  Would patient like information on creating a medical advance directive? Yes (ED - Information included in AVS) Yes (MAU/Ambulatory/Procedural Areas - Information given) No - patient declined information    Current Medications (verified) Outpatient Encounter Medications as of 05/23/2021  Medication Sig   acetaminophen (TYLENOL) 650 MG CR tablet Take 650 mg by mouth as needed for pain.   alendronate (FOSAMAX) 70 MG tablet TAKE ONE TABLET BY MOUTH ON AN EMPTY STOMACH WITH A GLASS FULL OF WATER ONCE A WEEK   amLODipine (NORVASC) 10 MG tablet TAKE 1 TABLET BY MOUTH AT BEDTIME.   betamethasone dipropionate 0.05 % cream Apply topically 2 (two) times  daily. Apply twice daily to rash on left elbow for 7 days, then as needed   calcium-vitamin D (OSCAL-500) 500-400 MG-UNIT tablet Take 1 tablet by mouth 2 (two) times daily.   Cetirizine HCl 10 MG TBDP Take 1 tablet by mouth daily.   Cholecalciferol (VITAMIN D3) 50 MCG (2000 UT) capsule Take 2,000 Units by mouth daily.   fluticasone (FLONASE) 50 MCG/ACT nasal spray USE 1 SPRAY IN EACH NOSTRIL DAILY.   levothyroxine (SYNTHROID) 100 MCG tablet Take 1 tablet (100 mcg total) by mouth daily.   lovastatin (MEVACOR) 40 MG tablet TAKE (1) TABLET BY MOUTH AT BEDTIME.   meloxicam (MOBIC) 7.5 MG tablet Take 1 tablet (7.5 mg total) by mouth daily.   montelukast (SINGULAIR) 10 MG tablet TAKE (1) TABLET BY MOUTH AT BEDTIME.   PARoxetine (PAXIL) 20 MG tablet TAKE ONE TABLET BY MOUTH DAILY.   penicillin v potassium (VEETID) 500 MG tablet Take 1 tablet (500 mg total) by mouth 3 (three) times daily.   SYMBICORT 80-4.5 MCG/ACT inhaler INHALE 2 PUFFS INTO THE LUNGS TWICE DAILY.   triamterene-hydrochlorothiazide (MAXZIDE-25) 37.5-25 MG tablet Take one and  A half tablets once daily for blood pressur   VENTOLIN HFA 108 (90 Base) MCG/ACT inhaler INHALE 2 PUFFS INTO THE LUNGS EVERY 4 HOURS AS NEEDED FOR COUGHNG ANDWHEEZING.   No facility-administered encounter medications on file as of 05/23/2021.    Allergies (verified) Lotensin hct [benazepril-hydrochlorothiazide]   History: Past Medical History:  Diagnosis Date   Allergy    Anxiety    Arthritis    Asthma    Hyperlipidemia  Hypertension    Vision abnormalities    states close to blindness due to cataracts   Past Surgical History:  Procedure Laterality Date   caesarean     cataracts     CESAREAN SECTION     TOTAL ABDOMINAL HYSTERECTOMY     prolapse   Family History  Problem Relation Age of Onset   Cancer Mother 36       stomach   Kidney disease Mother    Hypertension Mother    Diabetes Father    Cancer Father        lung    Diabetes Sister     Alcohol abuse Brother    Hyperlipidemia Sister    Diabetes Sister    Diabetes Sister    Diabetes Brother    Diabetes Brother    Diabetes Brother    Diabetes Brother    Cancer Daughter 35       cervix   Arthritis Other    Asthma Other    Social History   Socioeconomic History   Marital status: Single    Spouse name: Not on file   Number of children: Not on file   Years of education: 12th grade   Highest education level: Not on file  Occupational History   Occupation: disabled    Employer: NOT EMPLOYED  Tobacco Use   Smoking status: Former    Packs/day: 0.50    Years: 18.00    Pack years: 9.00    Types: Cigarettes    Quit date: 1989    Years since quitting: 33.4   Smokeless tobacco: Never  Vaping Use   Vaping Use: Never used  Substance and Sexual Activity   Alcohol use: No   Drug use: No   Sexual activity: Not Currently  Other Topics Concern   Not on file  Social History Narrative   Not on file   Social Determinants of Health   Financial Resource Strain: Not on file  Food Insecurity: Not on file  Transportation Needs: Not on file  Physical Activity: Not on file  Stress: Not on file  Social Connections: Not on file    Tobacco Counseling Counseling given: Not Answered   Clinical Intake:                 Diabetic? No         Activities of Daily Living In your present state of health, do you have any difficulty performing the following activities: 06/19/2020  Hearing? N  Vision? Y  Difficulty concentrating or making decisions? N  Walking or climbing stairs? Y  Dressing or bathing? N  Doing errands, shopping? Y  Some recent data might be hidden    Patient Care Team: Fayrene Helper, MD as PCP - Barbee Shropshire, MD as Consulting Physician (Otolaryngology) Carole Civil, MD as Consulting Physician (Orthopedic Surgery)  Indicate any recent Medical Services you may have received from other than [facility] providers in the past  year (date may be approximate).     Assessment:   This is a routine wellness examination for [name].  Hearing/Vision screen No results found.  Dietary issues and exercise activities discussed:     Goals Addressed   None    Depression Screen PHQ 2/9 Scores 04/16/2021 11/08/2020 06/19/2020 09/23/2019 06/15/2019 04/22/2019 12/15/2018  PHQ - 2 Score 1 1 0 0 1 0 2  PHQ- 9 Score - - 2 1 - - 3    Fall Risk Fall Risk  04/16/2021 06/19/2020  09/23/2019 06/15/2019 04/22/2019  Falls in the past year? 0 0 0 1 0  Number falls in past yr: 0 0 0 1 -  Injury with Fall? 0 0 0 1 0  Risk for fall due to : - No Fall Risks - - -  Follow up - Falls evaluation completed - - -    FALL RISK PREVENTION PERTAINING TO THE HOME:  Any stairs in or around the home? {YES/NO:21197} If so, are there any without handrails? No  Home free of loose throw rugs in walkways, pet beds, electrical cords, etc? Yes  Adequate lighting in your home to reduce risk of falls? Yes   ASSISTIVE DEVICES UTILIZED TO PREVENT FALLS:  Life alert? {YES/NO:21197} Use of a cane, walker or w/c? {YES/NO:21197} Grab bars in the bathroom? {YES/NO:21197} Shower chair or bench in shower? {YES/NO:21197} Elevated toilet seat or a handicapped toilet? {YES/NO:21197}    Cognitive Function:     6CIT Screen 04/22/2019 04/20/2018 12/30/2016  What Year? 0 points 0 points 0 points  What month? 0 points 0 points 0 points  What time? 0 points - 0 points  Count back from 20 0 points 0 points 0 points  Months in reverse 0 points 0 points 0 points  Repeat phrase 0 points 0 points 0 points  Total Score 0 - 0    Immunizations Immunization History  Administered Date(s) Administered   Fluad Quad(high Dose 65+) 10/06/2019, 08/18/2020   H1N1 11/15/2008   Influenza Split 08/30/2011, 08/14/2012   Influenza Whole 09/15/2009, 09/26/2010   Influenza,inj,Quad PF,6+ Mos 10/18/2013, 08/25/2014, 12/13/2015, 08/05/2016, 09/22/2017, 07/28/2018   Moderna Sars-Covid-2  Vaccination 01/16/2020, 02/16/2020   Pneumococcal Conjugate-13 05/03/2015   Pneumococcal Polysaccharide-23 08/05/2016   Td 03/02/2004   Tdap 12/12/2011   Zoster Recombinat (Shingrix) 09/13/2019, 11/15/2019    TDAP status: Up to date  Flu Vaccine status: Up to date  Pneumococcal vaccine status: Up to date  Covid-19 vaccine status: Completed vaccines  Qualifies for Shingles Vaccine? Yes   Zostavax completed No   Shingrix Completed?: Yes  Screening Tests Health Maintenance  Topic Date Due   COVID-19 Vaccine (3 - Booster for Moderna series) 07/18/2020   COLONOSCOPY (Pts 45-86yrs Insurance coverage will need to be confirmed)  05/12/2021   INFLUENZA VACCINE  07/09/2021   TETANUS/TDAP  12/11/2021   MAMMOGRAM  07/14/2022   DEXA SCAN  Completed   Hepatitis C Screening  Completed   PNA vac Low Risk Adult  Completed   Zoster Vaccines- Shingrix  Completed   HPV VACCINES  Aged Out    Health Maintenance  Health Maintenance Due  Topic Date Due   COVID-19 Vaccine (3 - Booster for Moderna series) 07/18/2020   COLONOSCOPY (Pts 45-88yrs Insurance coverage will need to be confirmed)  05/12/2021    Colorectal cancer screening: Referral to GI placed 05/23/2021. Pt aware the office will call re: appt.  Mammogram status: Completed 07/14/2020. Repeat every year  Bone Density status: Completed 07/04/2020. Results reflect: Bone density results: OSTEOPENIA. Repeat every 5 years.  Lung Cancer Screening: (Low Dose CT Chest recommended if Age 40-80 years, 30 pack-year currently smoking OR have quit w/in 15years.) does not qualify.   Lung Cancer Screening Referral: N/A   Additional Screening:  Hepatitis C Screening: does qualify; Completed 12/13/2015  Vision Screening: Recommended annual ophthalmology exams for early detection of glaucoma and other disorders of the eye. Is the patient up to date with their annual eye exam?  {YES/NO:21197} Who is the provider or what is  the name of the  office in which the patient attends annual eye exams? *** If pt is not established with a provider, would they like to be referred to a provider to establish care? {YES/NO:21197}.   Dental Screening: Recommended annual dental exams for proper oral hygiene  Community Resource Referral / Chronic Care Management: CRR required this visit?  No   CCM required this visit?  No      Plan:     I have personally reviewed and noted the following in the patient's chart:   Medical and social history Use of alcohol, tobacco or illicit drugs  Current medications and supplements including opioid prescriptions.  Functional ability and status Nutritional status Physical activity Advanced directives List of other physicians Hospitalizations, surgeries, and ER visits in previous 12 months Vitals Screenings to include cognitive, depression, and falls Referrals and appointments  In addition, I have reviewed and discussed with patient certain preventive protocols, quality metrics, and best practice recommendations. A written personalized care plan for preventive services as well as general preventive health recommendations were provided to patient.     Ofilia Neas, LPN   [id number]   Nurse Notes: None

## 2021-05-24 ENCOUNTER — Encounter: Payer: Self-pay | Admitting: Internal Medicine

## 2021-06-13 ENCOUNTER — Ambulatory Visit (INDEPENDENT_AMBULATORY_CARE_PROVIDER_SITE_OTHER): Payer: Medicare Other

## 2021-06-13 ENCOUNTER — Other Ambulatory Visit: Payer: Self-pay

## 2021-06-13 DIAGNOSIS — Z0001 Encounter for general adult medical examination with abnormal findings: Secondary | ICD-10-CM

## 2021-06-13 DIAGNOSIS — Z0000 Encounter for general adult medical examination without abnormal findings: Secondary | ICD-10-CM

## 2021-06-13 DIAGNOSIS — Z1211 Encounter for screening for malignant neoplasm of colon: Secondary | ICD-10-CM

## 2021-06-13 NOTE — Patient Instructions (Signed)
[name] , Thank you for taking time to come for your Medicare Wellness Visit. I appreciate your ongoing commitment to your health goals. Please review the following plan we discussed and let me know if I can assist you in the future.   Screening recommendations/referrals: Colonoscopy: Currently due orders placed this visit  Mammogram: Up to date, keep upcoming august appointment  Bone Density: Up to date, next due 07/04/2025 Recommended yearly ophthalmology/optometry visit for glaucoma screening and checkup Recommended yearly dental visit for hygiene and checkup  Vaccinations: Influenza vaccine: Up to date, next due fall 2022  Pneumococcal vaccine: Completed series  Tdap vaccine: Up to date, next due 12/11/2021 Shingles vaccine: Completed series     Advanced directives: Advance directive discussed with you today. Even though you declined this today please call our office should you change your mind and we can give you the proper paperwork for you to fill out.   Conditions/risks identified: None   Next appointment: 06/18/2021 with Dr. Moshe Cipro    Preventive Care 30 Years and Older, Female Preventive care refers to lifestyle choices and visits with your health care provider that can promote health and wellness. What does preventive care include? A yearly physical exam. This is also called an annual well check. Dental exams once or twice a year. Routine eye exams. Ask your health care provider how often you should have your eyes checked. Personal lifestyle choices, including: Daily care of your teeth and gums. Regular physical activity. Eating a healthy diet. Avoiding tobacco and drug use. Limiting alcohol use. Practicing safe sex. Taking low-dose aspirin every day. Taking vitamin and mineral supplements as recommended by your health care provider. What happens during an annual well check? The services and screenings done by your health care provider during your annual well  check will depend on your age, overall health, lifestyle risk factors, and family history of disease. Counseling  Your health care provider may ask you questions about your: Alcohol use. Tobacco use. Drug use. Emotional well-being. Home and relationship well-being. Sexual activity. Eating habits. History of falls. Memory and ability to understand (cognition). Work and work Statistician. Reproductive health. Screening  You may have the following tests or measurements: Height, weight, and BMI. Blood pressure. Lipid and cholesterol levels. These may be checked every 5 years, or more frequently if you are over 35 years old. Skin check. Lung cancer screening. You may have this screening every year starting at age 15 if you have a 30-pack-year history of smoking and currently smoke or have quit within the past 15 years. Fecal occult blood test (FOBT) of the stool. You may have this test every year starting at age 34. Flexible sigmoidoscopy or colonoscopy. You may have a sigmoidoscopy every 5 years or a colonoscopy every 10 years starting at age 23. Hepatitis C blood test. Hepatitis B blood test. Sexually transmitted disease (STD) testing. Diabetes screening. This is done by checking your blood sugar (glucose) after you have not eaten for a while (fasting). You may have this done every 1-3 years. Bone density scan. This is done to screen for osteoporosis. You may have this done starting at age 68. Mammogram. This may be done every 1-2 years. Talk to your health care provider about how often you should have regular mammograms. Talk with your health care provider about your test results, treatment options, and if necessary, the need for more tests. Vaccines  Your health care provider may recommend certain vaccines, such as: Influenza vaccine. This is recommended every  year. Tetanus, diphtheria, and acellular pertussis (Tdap, Td) vaccine. You may need a Td booster every 10 years. Zoster  vaccine. You may need this after age 110. Pneumococcal 13-valent conjugate (PCV13) vaccine. One dose is recommended after age 72. Pneumococcal polysaccharide (PPSV23) vaccine. One dose is recommended after age 52. Talk to your health care provider about which screenings and vaccines you need and how often you need them. This information is not intended to replace advice given to you by your health care provider. Make sure you discuss any questions you have with your health care provider. Document Released: 12/22/2015 Document Revised: 08/14/2016 Document Reviewed: 09/26/2015 Elsevier Interactive Patient Education  2017 [employer] Prevention in the Home Falls can cause injuries. They can happen to people of all ages. There are many things you can do to make your home safe and to help prevent falls. What can I do on the outside of my home? Regularly fix the edges of walkways and driveways and fix any cracks. Remove anything that might make you trip as you walk through a door, such as a raised step or threshold. Trim any bushes or trees on the path to your home. Use bright outdoor lighting. Clear any walking paths of anything that might make someone trip, such as rocks or tools. Regularly check to see if handrails are loose or broken. Make sure that both sides of any steps have handrails. Any raised decks and porches should have guardrails on the edges. Have any leaves, snow, or ice cleared regularly. Use sand or salt on walking paths during winter. Clean up any spills in your garage right away. This includes oil or grease spills. What can I do in the bathroom? Use night lights. Install grab bars by the toilet and in the tub and shower. Do not use towel bars as grab bars. Use non-skid mats or decals in the tub or shower. If you need to sit down in the shower, use a plastic, non-slip stool. Keep the floor dry. Clean up any water that spills on the floor as soon as it happens. Remove  soap buildup in the tub or shower regularly. Attach bath mats securely with double-sided non-slip rug tape. Do not have throw rugs and other things on the floor that can make you trip. What can I do in the bedroom? Use night lights. Make sure that you have a light by your bed that is easy to reach. Do not use any sheets or blankets that are too big for your bed. They should not hang down onto the floor. Have a firm chair that has side arms. You can use this for support while you get dressed. Do not have throw rugs and other things on the floor that can make you trip. What can I do in the kitchen? Clean up any spills right away. Avoid walking on wet floors. Keep items that you use a lot in easy-to-reach places. If you need to reach something above you, use a strong step stool that has a grab bar. Keep electrical cords out of the way. Do not use floor polish or wax that makes floors slippery. If you must use wax, use non-skid floor wax. Do not have throw rugs and other things on the floor that can make you trip. What can I do with my stairs? Do not leave any items on the stairs. Make sure that there are handrails on both sides of the stairs and use them. Fix handrails that are broken or  loose. Make sure that handrails are as long as the stairways. Check any carpeting to make sure that it is firmly attached to the stairs. Fix any carpet that is loose or worn. Avoid having throw rugs at the top or bottom of the stairs. If you do have throw rugs, attach them to the floor with carpet tape. Make sure that you have a light switch at the top of the stairs and the bottom of the stairs. If you do not have them, ask someone to add them for you. What else can I do to help prevent falls? Wear shoes that: Do not have high heels. Have rubber bottoms. Are comfortable and fit you well. Are closed at the toe. Do not wear sandals. If you use a stepladder: Make sure that it is fully opened. Do not climb a  closed stepladder. Make sure that both sides of the stepladder are locked into place. Ask someone to hold it for you, if possible. Clearly mark and make sure that you can see: Any grab bars or handrails. First and last steps. Where the edge of each step is. Use tools that help you move around (mobility aids) if they are needed. These include: Canes. Walkers. Scooters. Crutches. Turn on the lights when you go into a dark area. Replace any light bulbs as soon as they burn out. Set up your furniture so you have a clear path. Avoid moving your furniture around. If any of your floors are uneven, fix them. If there are any pets around you, be aware of where they are. Review your medicines with your doctor. Some medicines can make you feel dizzy. This can increase your chance of falling. Ask your doctor what other things that you can do to help prevent falls. This information is not intended to replace advice given to you by your health care provider. Make sure you discuss any questions you have with your health care provider. Document Released: 09/21/2009 Document Revised: 05/02/2016 Document Reviewed: 12/30/2014 Elsevier Interactive Patient Education  2017 [employer].

## 2021-06-13 NOTE — Progress Notes (Signed)
Subjective:   [name] is a 70 y.o. female who presents for Medicare Annual (Subsequent) preventive examination.  I connected with Douglas Rooks today by telephone and verified that I am speaking with the correct person using two identifiers. Location patient: home Location provider: work Persons participating in the virtual visit: patient, provider.   I discussed the limitations, risks, security and privacy concerns of performing an evaluation and management service by telephone and the availability of in person appointments. I also discussed with the patient that there may be a patient responsible charge related to this service. The patient expressed understanding and verbally consented to this telephonic visit.    Interactive audio and video telecommunications were attempted between this provider and patient, however failed, due to patient having technical difficulties OR patient did not have access to video capability.  We continued and completed visit with audio only.     Review of Systems    N/A  Cardiac Risk Factors include: advanced age (>39men, >57 women);hypertension;dyslipidemia     Objective:    Today's Vitals   06/13/21 1311  PainSc: 0-No pain   There is no height or weight on file to calculate BMI.  Advanced Directives 06/13/2021 04/20/2018 12/30/2016 03/26/2015  Does Patient Have a Medical Advance Directive? No No No No  Would patient like information on creating a medical advance directive? No - Patient declined Yes (ED - Information included in AVS) Yes (MAU/Ambulatory/Procedural Areas - Information given) No - patient declined information    Current Medications (verified) Outpatient Encounter Medications as of 06/13/2021  Medication Sig   acetaminophen (TYLENOL) 650 MG CR tablet Take 650 mg by mouth as needed for pain.   alendronate (FOSAMAX) 70 MG tablet TAKE ONE TABLET BY MOUTH ON AN EMPTY STOMACH WITH A GLASS FULL OF WATER ONCE A WEEK   amLODipine  (NORVASC) 10 MG tablet TAKE 1 TABLET BY MOUTH AT BEDTIME.   betamethasone dipropionate 0.05 % cream Apply topically 2 (two) times daily. Apply twice daily to rash on left elbow for 7 days, then as needed   calcium-vitamin D (OSCAL-500) 500-400 MG-UNIT tablet Take 1 tablet by mouth 2 (two) times daily.   Cetirizine HCl 10 MG TBDP Take 1 tablet by mouth daily.   Cholecalciferol (VITAMIN D3) 50 MCG (2000 UT) capsule Take 2,000 Units by mouth daily.   fluticasone (FLONASE) 50 MCG/ACT nasal spray USE 1 SPRAY IN EACH NOSTRIL DAILY.   levothyroxine (SYNTHROID) 100 MCG tablet Take 1 tablet (100 mcg total) by mouth daily.   lovastatin (MEVACOR) 40 MG tablet TAKE (1) TABLET BY MOUTH AT BEDTIME.   meloxicam (MOBIC) 7.5 MG tablet Take 1 tablet (7.5 mg total) by mouth daily.   montelukast (SINGULAIR) 10 MG tablet TAKE (1) TABLET BY MOUTH AT BEDTIME.   PARoxetine (PAXIL) 20 MG tablet TAKE ONE TABLET BY MOUTH DAILY.   SYMBICORT 80-4.5 MCG/ACT inhaler INHALE 2 PUFFS INTO THE LUNGS TWICE DAILY.   triamterene-hydrochlorothiazide (MAXZIDE-25) 37.5-25 MG tablet Take one and  A half tablets once daily for blood pressur   VENTOLIN HFA 108 (90 Base) MCG/ACT inhaler INHALE 2 PUFFS INTO THE LUNGS EVERY 4 HOURS AS NEEDED FOR COUGHNG ANDWHEEZING.   [DISCONTINUED] penicillin v potassium (VEETID) 500 MG tablet Take 1 tablet (500 mg total) by mouth 3 (three) times daily.   No facility-administered encounter medications on file as of 06/13/2021.    Allergies (verified) Lotensin hct [benazepril-hydrochlorothiazide]   History: Past Medical History:  Diagnosis Date   Allergy  Anxiety    Arthritis    Asthma    Hyperlipidemia    Hypertension    Vision abnormalities    states close to blindness due to cataracts   Past Surgical History:  Procedure Laterality Date   caesarean     cataracts     CESAREAN SECTION     TOTAL ABDOMINAL HYSTERECTOMY     prolapse   Family History  Problem Relation Age of Onset   Cancer  Mother 65       stomach   Kidney disease Mother    Hypertension Mother    Diabetes Father    Cancer Father        lung    Diabetes Sister    Alcohol abuse Brother    Hyperlipidemia Sister    Diabetes Sister    Diabetes Sister    Diabetes Brother    Diabetes Brother    Diabetes Brother    Diabetes Brother    Cancer Daughter 32       cervix   Arthritis Other    Asthma Other    Social History   Socioeconomic History   Marital status: Single    Spouse name: Not on file   Number of children: Not on file   Years of education: 12th grade   Highest education level: Not on file  Occupational History   Occupation: disabled    Employer: NOT EMPLOYED  Tobacco Use   Smoking status: Former    Packs/day: 0.50    Years: 18.00    Pack years: 9.00    Types: Cigarettes    Quit date: 1989    Years since quitting: 33.5   Smokeless tobacco: Never  Vaping Use   Vaping Use: Never used  Substance and Sexual Activity   Alcohol use: No   Drug use: No   Sexual activity: Not Currently  Other Topics Concern   Not on file  Social History Narrative   Not on file   Social Determinants of Health   Financial Resource Strain: Low Risk    Difficulty of Paying Living Expenses: Not hard at all  Food Insecurity: No Food Insecurity   Worried About Charity fundraiser in the Last Year: Never true   [place] in the Last Year: Never true  Transportation Needs: No Transportation Needs   Lack of Transportation (Medical): No   Lack of Transportation (Non-Medical): No  Physical Activity: Insufficiently Active   Days of Exercise per Week: 7 days   Minutes of Exercise per Session: 10 min  Stress: No Stress Concern Present   Feeling of Stress : Not at all  Social Connections: Moderately Isolated   Frequency of Communication with Friends and Family: More than three times a week   Frequency of Social Gatherings with Friends and Family: More than three times a week   Attends Religious  Services: Never   Marine scientist or Organizations: Yes   Attends Music therapist: More than 4 times per year   Marital Status: Never married    Tobacco Counseling Counseling given: Not Answered   Clinical Intake:  Pre-visit preparation completed: Yes  Pain : No/denies pain Pain Score: 0-No pain     Diabetes: No  How often do you need to have someone help you when you read instructions, pamphlets, or other written materials from your doctor or pharmacy?: 4 - Often (Due to vision)  Diabetic?No  Interpreter Needed?: No  Information entered by ::  SCrews,LPN   Activities of Daily Living In your present state of health, do you have any difficulty performing the following activities: 06/13/2021 06/19/2020  Hearing? Y N  Comment has some hearing difficulties in right ear -  Vision? Y Y  Difficulty concentrating or making decisions? N N  Walking or climbing stairs? N Y  Dressing or bathing? N N  Doing errands, shopping? Tempie Donning  Preparing Food and eating ? N -  Using the Toilet? N -  In the past six months, have you accidently leaked urine? Y -  Do you have problems with loss of bowel control? N -  Managing your Medications? N -  Managing your Finances? N -  Housekeeping or managing your Housekeeping? N -  Some recent data might be hidden    Patient Care Team: Fayrene Helper, MD as PCP - General Leta Baptist, MD as Consulting Physician (Otolaryngology) Carole Civil, MD as Consulting Physician (Orthopedic Surgery)  Indicate any recent Medical Services you may have received from other than [facility] providers in the past year (date may be approximate).     Assessment:   This is a routine wellness examination for [name].  Hearing/Vision screen Vision Screening - Comments:: Patient states has not eye doctor appointment in several years. Has been pending a cornea transplant but has missed appointments.   Dietary issues and exercise activities  discussed: Current Exercise Habits: Home exercise routine, Type of exercise: strength training/weights, Time (Minutes): 10, Frequency (Times/Week): 7, Weekly Exercise (Minutes/Week): 70, Intensity: Mild, Exercise limited by: None identified   Goals Addressed             This Visit's Progress    DIET - INCREASE WATER INTAKE       Exercise 3x per week (30 min per time)         Depression Screen PHQ 2/9 Scores 06/13/2021 04/16/2021 11/08/2020 06/19/2020 09/23/2019 06/15/2019 04/22/2019  PHQ - 2 Score 0 1 1 0 0 1 0  PHQ- 9 Score - - - 2 1 - -    Fall Risk Fall Risk  06/13/2021 04/16/2021 06/19/2020 09/23/2019 06/15/2019  Falls in the past year? 0 0 0 0 1  Number falls in past yr: 0 0 0 0 1  Injury with Fall? 0 0 0 0 1  Risk for fall due to : Impaired vision - No Fall Risks - -  Follow up Falls evaluation completed;Falls prevention discussed - Falls evaluation completed - -    FALL RISK PREVENTION PERTAINING TO THE HOME:  Any stairs in or around the home? No  If so, are there any without handrails? No  Home free of loose throw rugs in walkways, pet beds, electrical cords, etc? Yes  Adequate lighting in your home to reduce risk of falls? Yes   ASSISTIVE DEVICES UTILIZED TO PREVENT FALLS:  Life alert? No  Use of a cane, walker or w/c? Yes  Grab bars in the bathroom? No  Shower chair or bench in shower? No  Elevated toilet seat or a handicapped toilet? No     Cognitive Function:   Normal cognitive status assessed by direct observation by this Nurse Health Advisor. No abnormalities found.     6CIT Screen 04/22/2019 04/20/2018 12/30/2016  What Year? 0 points 0 points 0 points  What month? 0 points 0 points 0 points  What time? 0 points - 0 points  Count back from 20 0 points 0 points 0 points  Months in reverse 0 points 0  points 0 points  Repeat phrase 0 points 0 points 0 points  Total Score 0 - 0    Immunizations Immunization History  Administered Date(s) Administered   Fluad  Quad(high Dose 65+) 10/06/2019, 08/18/2020   H1N1 11/15/2008   Influenza Split 08/30/2011, 08/14/2012   Influenza Whole 09/15/2009, 09/26/2010   Influenza,inj,Quad PF,6+ Mos 10/18/2013, 08/25/2014, 12/13/2015, 08/05/2016, 09/22/2017, 07/28/2018   Moderna Sars-Covid-2 Vaccination 01/16/2020, 02/16/2020   Pneumococcal Conjugate-13 05/03/2015   Pneumococcal Polysaccharide-23 08/05/2016   Td 03/02/2004   Tdap 12/12/2011   Zoster Recombinat (Shingrix) 09/13/2019, 11/15/2019    TDAP status: Up to date  Flu Vaccine status: Up to date  Pneumococcal vaccine status: Up to date  Covid-19 vaccine status: Completed vaccines  Qualifies for Shingles Vaccine? Yes   Zostavax completed No   Shingrix Completed?: Yes  Screening Tests Health Maintenance  Topic Date Due   COVID-19 Vaccine (3 - Booster for Moderna series) 07/18/2020   COLONOSCOPY (Pts 45-59yrs Insurance coverage will need to be confirmed)  05/12/2021   INFLUENZA VACCINE  07/09/2021   TETANUS/TDAP  12/11/2021   MAMMOGRAM  07/14/2022   DEXA SCAN  Completed   Hepatitis C Screening  Completed   PNA vac Low Risk Adult  Completed   Zoster Vaccines- Shingrix  Completed   HPV VACCINES  Aged Out    Health Maintenance  Health Maintenance Due  Topic Date Due   COVID-19 Vaccine (3 - Booster for Moderna series) 07/18/2020   COLONOSCOPY (Pts 45-30yrs Insurance coverage will need to be confirmed)  05/12/2021    Colorectal cancer screening: Referral to GI placed 06/13/2021. Pt aware the office will call re: appt.  Mammogram status: Ordered 07/14/2020. Pt provided with contact info and advised to call to schedule appt.   Bone Density status: Completed 07/04/2020. Results reflect: Bone density results: OSTEOPENIA. Repeat every 5 years.  Lung Cancer Screening: (Low Dose CT Chest recommended if Age 66-80 years, 30 pack-year currently smoking OR have quit w/in 15years.) does not qualify.   Lung Cancer Screening Referral: N/A    Additional Screening:  Hepatitis C Screening: does qualify; Completed 12/13/2015  Vision Screening: Recommended annual ophthalmology exams for early detection of glaucoma and other disorders of the eye. Is the patient up to date with their annual eye exam?  No  Who is the provider or what is the name of the office in which the patient attends annual eye exams? [facility]  If pt is not established with a provider, would they like to be referred to a provider to establish care? No .   Dental Screening: Recommended annual dental exams for proper oral hygiene  Community Resource Referral / Chronic Care Management: CRR required this visit?  No   CCM required this visit?  No      Plan:     I have personally reviewed and noted the following in the patient's chart:   Medical and social history Use of alcohol, tobacco or illicit drugs  Current medications and supplements including opioid prescriptions.  Functional ability and status Nutritional status Physical activity Advanced directives List of other physicians Hospitalizations, surgeries, and ER visits in previous 12 months Vitals Screenings to include cognitive, depression, and falls Referrals and appointments  In addition, I have reviewed and discussed with patient certain preventive protocols, quality metrics, and best practice recommendations. A written personalized care plan for preventive services as well as general preventive health recommendations were provided to patient.     Ofilia Neas, LPN   05/13/7845  Nurse Notes: None

## 2021-06-18 ENCOUNTER — Ambulatory Visit: Payer: Medicare Other | Admitting: Family Medicine

## 2021-06-19 ENCOUNTER — Other Ambulatory Visit: Payer: Self-pay | Admitting: Family Medicine

## 2021-07-05 ENCOUNTER — Ambulatory Visit: Payer: Medicare Other | Admitting: Family Medicine

## 2021-07-10 ENCOUNTER — Other Ambulatory Visit: Payer: Self-pay

## 2021-07-10 ENCOUNTER — Ambulatory Visit (INDEPENDENT_AMBULATORY_CARE_PROVIDER_SITE_OTHER): Payer: Medicare Other | Admitting: Family Medicine

## 2021-07-10 ENCOUNTER — Encounter: Payer: Self-pay | Admitting: Family Medicine

## 2021-07-10 VITALS — BP 134/76 | HR 77 | Resp 16 | Ht 67.0 in | Wt 196.1 lb

## 2021-07-10 DIAGNOSIS — E669 Obesity, unspecified: Secondary | ICD-10-CM | POA: Diagnosis not present

## 2021-07-10 DIAGNOSIS — I10 Essential (primary) hypertension: Secondary | ICD-10-CM | POA: Diagnosis not present

## 2021-07-10 DIAGNOSIS — M542 Cervicalgia: Secondary | ICD-10-CM

## 2021-07-10 DIAGNOSIS — G44201 Tension-type headache, unspecified, intractable: Secondary | ICD-10-CM | POA: Diagnosis not present

## 2021-07-10 DIAGNOSIS — M25551 Pain in right hip: Secondary | ICD-10-CM | POA: Diagnosis not present

## 2021-07-10 DIAGNOSIS — R7303 Prediabetes: Secondary | ICD-10-CM

## 2021-07-10 DIAGNOSIS — M5410 Radiculopathy, site unspecified: Secondary | ICD-10-CM

## 2021-07-10 DIAGNOSIS — E038 Other specified hypothyroidism: Secondary | ICD-10-CM

## 2021-07-10 DIAGNOSIS — E785 Hyperlipidemia, unspecified: Secondary | ICD-10-CM

## 2021-07-10 MED ORDER — METHYLPREDNISOLONE ACETATE 80 MG/ML IJ SUSP
80.0000 mg | Freq: Once | INTRAMUSCULAR | Status: AC
  Administered 2021-07-10: 80 mg via INTRAMUSCULAR

## 2021-07-10 MED ORDER — KETOROLAC TROMETHAMINE 60 MG/2ML IM SOLN
60.0000 mg | Freq: Once | INTRAMUSCULAR | Status: AC
  Administered 2021-07-10: 60 mg via INTRAMUSCULAR

## 2021-07-10 MED ORDER — POLYETHYLENE GLYCOL 3350 17 GM/SCOOP PO POWD: 17.0000 g | Freq: Two times a day (BID) | ORAL | 1 refills | Status: AC | PRN

## 2021-07-10 NOTE — Patient Instructions (Addendum)
F/U inm office in 4 months, call if you need em sooner  Toradol 60 mg IM and depo medrol 80 mg IM in office for hip pain  Start daily miralax, and commit to 2 stool softeners every day also for constipation, these are prescribed Please be careful not to fall  Thanks for choosing [facility], we consider it a privelige to serve you.

## 2021-07-16 ENCOUNTER — Encounter: Payer: Self-pay | Admitting: Family Medicine

## 2021-07-16 DIAGNOSIS — R519 Headache, unspecified: Secondary | ICD-10-CM | POA: Insufficient documentation

## 2021-07-16 NOTE — Assessment & Plan Note (Signed)
Patient educated about the importance of limiting  Carbohydrate intake , the need to commit to daily physical activity for a minimum of 30 minutes , and to commit weight loss. The fact that changes in all these areas will reduce or eliminate all together the development of diabetes is stressed.  improved  Diabetic Labs Latest Ref Rng & Units 04/17/2021 04/16/2021 06/19/2020 06/15/2019 12/15/2018  HbA1c 0.0 - 7.0 % 5.7 - - 5.6 5.8(H)  Chol 100 - 199 mg/dL - 186 194 188 220(H)  HDL >39 mg/dL - 74 59 59 75  Calc LDL 0 - 99 mg/dL - 95 110(H) 105(H) 124(H)  Triglycerides 0 - 149 mg/dL - 93 137 143 107  Creatinine 0.57 - 1.00 mg/dL - 0.79 0.96(H) 0.79 0.72   BP/Weight 07/10/2021 06/13/2021 04/17/2021 04/16/2021 11/08/2020 10/18/2020 0000000  Systolic BP Q000111Q - Q000111Q 123456 Q000111Q [id number]  Diastolic BP 76 - 82 84 84 79 88  Wt. (Lbs) 196.12 - 198.4 198 200 203 208.12  BMI 30.72 - 31.07 31.01 31.8 31.79 32.6   No flowsheet data found.

## 2021-07-16 NOTE — Assessment & Plan Note (Signed)
Uncontrolled.Toradol and depo medrol administered IM in the office , to be followed by a short course of oral prednisone   

## 2021-07-16 NOTE — Assessment & Plan Note (Signed)
Hyperlipidemia:Low fat diet discussed and encouraged.   Lipid Panel  Lab Results  Component Value Date   CHOL 186 04/16/2021   HDL 74 04/16/2021   LDLCALC 95 04/16/2021   TRIG 93 04/16/2021   CHOLHDL 2.5 04/16/2021     Controlled, no change in medication

## 2021-07-16 NOTE — Assessment & Plan Note (Signed)
Uncontrolled.Toradol and depo medrol administered IM in the office , to be followed by a short course of oral prednisone , no new  focal deficits on exam.

## 2021-07-16 NOTE — Progress Notes (Signed)
[name]     MRN: [id number]      DOB: August 09, 1951   HPI [name] is here for follow up and re-evaluation of chronic medical conditions, medication management and review of any available recent lab and radiology data.  Preventive health is updated, specifically  Cancer screening and Immunization.   Questions or concerns regarding consultations or procedures which the PT has had in the interim are  addressed. The PT denies any adverse reactions to current medications since the last visit.  Increased and uncontrolled right hip pain x 1 month, no inciting trauma C/o constipation   ROS Denies recent fever or chills. Denies sinus pressure, nasal congestion, ear pain or sore throat. Denies chest congestion, productive cough or wheezing. Denies chest pains, palpitations and leg swelling Denies abdominal pain, nausea, vomiting,diarrhea    Denies dysuria, frequency, hesitancy or incontinence.  Denies headaches, seizures, numbness, or tingling. Denies uncontrolled depression, anxiety or insomnia. Denies skin break down or rash.   PE  BP 134/76   Pulse 77   Resp 16   Ht '5\' 7"'$  (1.702 m)   Wt 196 lb 1.9 oz (89 kg)   SpO2 94%   BMI 30.72 kg/m   Patient alert and oriented and in no cardiopulmonary distress.  HEENT: No facial asymmetry, EOMI,     Neck decreased ROM .  Chest: Clear to auscultation bilaterally.  CVS: S1, S2 no murmurs, no S3.Regular rate.  ABD: Soft non tender.   Ext: No edema  MS: decreased  ROM spine, , hips and knees.Marked reduction in ROM right hip  Skin: Intact, no ulcerations or rash noted.  Psych:  normal affect. Memory intact not anxious or depressed appearing.  CNS: CN 4 to 2 intact, power,  normal throughout.legally blind   Assessment & Plan  HIP PAIN, RIGHT Uncontrolled.Toradol and depo medrol administered IM in the office , to be followed by a short course of oral prednisone .   Essential hypertension Controlled, no change in  medication DASH diet and commitment to daily physical activity for a minimum of 30 minutes discussed and encouraged, as a part of hypertension management. The importance of attaining a healthy weight is also discussed.  BP/Weight 07/10/2021 06/13/2021 04/17/2021 04/16/2021 11/08/2020 10/18/2020 0000000  Systolic BP Q000111Q - Q000111Q 123456 Q000111Q [id number]  Diastolic BP 76 - 82 84 84 79 88  Wt. (Lbs) 196.12 - 198.4 198 200 203 208.12  BMI 30.72 - 31.07 31.01 31.8 31.79 32.6       Obesity (BMI 30.0-34.9) Improved. Pt applauded on succesful weight loss through lifestyle change, and encouraged to continue same. Weight loss goal set for the next several months.   Prediabetes Patient educated about the importance of limiting  Carbohydrate intake , the need to commit to daily physical activity for a minimum of 30 minutes , and to commit weight loss. The fact that changes in all these areas will reduce or eliminate all together the development of diabetes is stressed.  improved  Diabetic Labs Latest Ref Rng & Units 04/17/2021 04/16/2021 06/19/2020 06/15/2019 12/15/2018  HbA1c 0.0 - 7.0 % 5.7 - - 5.6 5.8(H)  Chol 100 - 199 mg/dL - 186 194 188 220(H)  HDL >39 mg/dL - 74 59 59 75  Calc LDL 0 - 99 mg/dL - 95 110(H) 105(H) 124(H)  Triglycerides 0 - 149 mg/dL - 93 137 143 107  Creatinine 0.57 - 1.00 mg/dL - 0.79 0.96(H) 0.79 0.72   BP/Weight 07/10/2021 06/13/2021 04/17/2021 04/16/2021  11/08/2020 10/18/2020 0000000  Systolic BP Q000111Q - Q000111Q 123456 Q000111Q [id number]  Diastolic BP 76 - 82 84 84 79 88  Wt. (Lbs) 196.12 - 198.4 198 200 203 208.12  BMI 30.72 - 31.07 31.01 31.8 31.79 32.6   No flowsheet data found.    Hypothyroidism Controlled, no change in medication   Hyperlipidemia LDL goal <100 Hyperlipidemia:Low fat diet discussed and encouraged.   Lipid Panel  Lab Results  Component Value Date   CHOL 186 04/16/2021   HDL 74 04/16/2021   LDLCALC 95 04/16/2021   TRIG 93 04/16/2021   CHOLHDL 2.5 04/16/2021      Controlled, no change in medication   Headache Uncontrolled.Toradol and depo medrol administered IM in the office , to be followed by a short course of oral prednisone , no new  focal deficits on exam.

## 2021-07-16 NOTE — Assessment & Plan Note (Signed)
Controlled, no change in medication DASH diet and commitment to daily physical activity for a minimum of 30 minutes discussed and encouraged, as a part of hypertension management. The importance of attaining a healthy weight is also discussed.  BP/Weight 07/10/2021 06/13/2021 04/17/2021 04/16/2021 11/08/2020 10/18/2020 0000000  Systolic BP Q000111Q - Q000111Q 123456 Q000111Q [id number]  Diastolic BP 76 - 82 84 84 79 88  Wt. (Lbs) 196.12 - 198.4 198 200 203 208.12  BMI 30.72 - 31.07 31.01 31.8 31.79 32.6

## 2021-07-16 NOTE — Assessment & Plan Note (Signed)
Controlled, no change in medication  

## 2021-07-16 NOTE — Assessment & Plan Note (Signed)
Improved. Pt applauded on succesful weight loss through lifestyle change, and encouraged to continue same. Weight loss goal set for the next several months.  

## 2021-07-19 ENCOUNTER — Encounter: Payer: Self-pay | Admitting: *Deleted

## 2021-07-19 ENCOUNTER — Ambulatory Visit (HOSPITAL_COMMUNITY)
Admission: RE | Admit: 2021-07-19 | Discharge: 2021-07-19 | Disposition: A | Discharge status: Home / Self Care | Payer: Medicare Other | Source: Ambulatory Visit | Attending: Family Medicine | Admitting: Family Medicine

## 2021-07-19 ENCOUNTER — Other Ambulatory Visit: Payer: Self-pay | Admitting: Family Medicine

## 2021-07-19 ENCOUNTER — Other Ambulatory Visit: Payer: Self-pay

## 2021-07-19 DIAGNOSIS — Z1231 Encounter for screening mammogram for malignant neoplasm of breast: Secondary | ICD-10-CM | POA: Insufficient documentation

## 2021-07-21 ENCOUNTER — Other Ambulatory Visit: Payer: Self-pay | Admitting: Family Medicine

## 2021-07-24 ENCOUNTER — Other Ambulatory Visit: Payer: Self-pay | Admitting: Family Medicine

## 2021-07-26 ENCOUNTER — Other Ambulatory Visit: Payer: Self-pay | Admitting: Family Medicine

## 2021-07-30 ENCOUNTER — Other Ambulatory Visit: Payer: Self-pay | Admitting: Family Medicine

## 2021-08-01 ENCOUNTER — Other Ambulatory Visit: Payer: Self-pay

## 2021-08-01 ENCOUNTER — Encounter: Payer: Self-pay | Admitting: Internal Medicine

## 2021-08-01 ENCOUNTER — Ambulatory Visit (INDEPENDENT_AMBULATORY_CARE_PROVIDER_SITE_OTHER): Payer: Medicare Other | Admitting: Internal Medicine

## 2021-08-01 VITALS — BP 154/92 | HR 78 | Ht 66.5 in | Wt 148.0 lb

## 2021-08-01 DIAGNOSIS — J309 Allergic rhinitis, unspecified: Secondary | ICD-10-CM

## 2021-08-01 DIAGNOSIS — H6122 Impacted cerumen, left ear: Secondary | ICD-10-CM | POA: Diagnosis not present

## 2021-08-01 MED ORDER — FLUTICASONE PROPIONATE 50 MCG/ACT NA SUSP: 1.0000 | Freq: Every day | NASAL | 0 refills | Status: DC

## 2021-08-01 MED ORDER — CARBAMIDE PEROXIDE 6.5 % OT SOLN: 5.0000 [drp] | Freq: Two times a day (BID) | OTIC | 0 refills | Status: DC

## 2021-08-01 NOTE — Progress Notes (Signed)
Acute Office Visit  Subjective:    Patient ID: [name], female    DOB: 1951/11/28, 70 y.o.   MRN: [id number]  Chief Complaint  Patient presents with   Otalgia    L ear, feels a bug moving around since around 12am this morning.    HPI Patient is in today for evaluation of left ear ringing since last night. She feels that an insect went into her left ear and has been buzzing. She denies any ear pain or discharge. Of note, she has history of excess cerumen/impaction in the past and has seen ENT specialist for it. She has tried using some OTC ear drop for it as well.  She also c/o chronic nasal congestion and irritation. Denies any fever, chills, sore throat.  Past Medical History:  Diagnosis Date   Allergy    Anxiety    Arthritis    Asthma    Hyperlipidemia    Hypertension    Vision abnormalities    states close to blindness due to cataracts    Past Surgical History:  Procedure Laterality Date   caesarean     cataracts     CESAREAN SECTION     TOTAL ABDOMINAL HYSTERECTOMY     prolapse    Family History  Problem Relation Age of Onset   Cancer Mother 37       stomach   Kidney disease Mother    Hypertension Mother    Diabetes Father    Cancer Father        lung    Diabetes Sister    Alcohol abuse Brother    Hyperlipidemia Sister    Diabetes Sister    Diabetes Sister    Diabetes Brother    Diabetes Brother    Diabetes Brother    Diabetes Brother    Cancer Daughter 27       cervix   Arthritis Other    Asthma Other     Social History   Socioeconomic History   Marital status: Single    Spouse name: Not on file   Number of children: Not on file   Years of education: 12th grade   Highest education level: Not on file  Occupational History   Occupation: disabled    Employer: NOT EMPLOYED  Tobacco Use   Smoking status: Former    Packs/day: 0.50    Years: 18.00    Pack years: 9.00    Types: Cigarettes    Quit date: 1989    Years since  quitting: 33.6   Smokeless tobacco: Never  Vaping Use   Vaping Use: Never used  Substance and Sexual Activity   Alcohol use: No   Drug use: No   Sexual activity: Not Currently  Other Topics Concern   Not on file  Social History Narrative   Not on file   Social Determinants of Health   Financial Resource Strain: Low Risk    Difficulty of Paying Living Expenses: Not hard at all  Food Insecurity: No Food Insecurity   Worried About Charity fundraiser in the Last Year: Never true   [place] in the Last Year: Never true  Transportation Needs: No Transportation Needs   Lack of Transportation (Medical): No   Lack of Transportation (Non-Medical): No  Physical Activity: Insufficiently Active   Days of Exercise per Week: 7 days   Minutes of Exercise per Session: 10 min  Stress: No Stress Concern Present   Feeling of Stress :  Not at all  Social Connections: Moderately Isolated   Frequency of Communication with Friends and Family: More than three times a week   Frequency of Social Gatherings with Friends and Family: More than three times a week   Attends Religious Services: Never   Marine scientist or Organizations: Yes   Attends Music therapist: More than 4 times per year   Marital Status: Never married  Human resources officer Violence: Not At Risk   Fear of Current or Ex-Partner: No   Emotionally Abused: No   Physically Abused: No   Sexually Abused: No    Outpatient Medications Prior to Visit  Medication Sig Dispense Refill   acetaminophen (TYLENOL) 650 MG CR tablet Take 650 mg by mouth as needed for pain.     alendronate (FOSAMAX) 70 MG tablet TAKE ONE TABLET BY MOUTH ON AN EMPTY STOMACH WITH A GLASS FULL OF WATER ONCE A WEEK 4 tablet 0   amLODipine (NORVASC) 10 MG tablet TAKE 1 TABLET BY MOUTH AT BEDTIME. 30 tablet 0   betamethasone dipropionate 0.05 % cream Apply topically 2 (two) times daily. Apply twice daily to rash on left elbow for 7 days, then as  needed 45 g 0   calcium-vitamin D (OSCAL-500) 500-400 MG-UNIT tablet Take 1 tablet by mouth 2 (two) times daily. 60 tablet 11   Cetirizine HCl 10 MG TBDP Take 1 tablet by mouth daily. 30 tablet 5   Cholecalciferol (VITAMIN D3) 50 MCG (2000 UT) capsule Take 2,000 Units by mouth daily.     levothyroxine (SYNTHROID) 100 MCG tablet Take 1 tablet (100 mcg total) by mouth daily. 90 tablet 3   lovastatin (MEVACOR) 40 MG tablet TAKE (1) TABLET BY MOUTH AT BEDTIME. 30 tablet 0   montelukast (SINGULAIR) 10 MG tablet TAKE (1) TABLET BY MOUTH AT BEDTIME. 90 tablet 0   PARoxetine (PAXIL) 20 MG tablet TAKE ONE TABLET BY MOUTH DAILY. 30 tablet 0   polyethylene glycol powder (GLYCOLAX/MIRALAX) 17 GM/SCOOP powder Take 17 g by mouth 2 (two) times daily as needed. 3350 g 1   SYMBICORT 80-4.5 MCG/ACT inhaler INHALE 2 PUFFS INTO THE LUNGS TWICE DAILY. 10.2 g 0   triamterene-hydrochlorothiazide (MAXZIDE-25) 37.5-25 MG tablet Take one and  A half tablets once daily for blood pressur 45 tablet 5   VENTOLIN HFA 108 (90 Base) MCG/ACT inhaler INHALE 2 PUFFS INTO THE LUNGS EVERY 4 HOURS AS NEEDED FOR COUGHNG ANDWHEEZING. 18 g 0   fluticasone (FLONASE) 50 MCG/ACT nasal spray USE 1 SPRAY IN EACH NOSTRIL DAILY. 16 g 0   No facility-administered medications prior to visit.    Allergies  Allergen Reactions   Lotensin Hct [Benazepril-Hydrochlorothiazide] Hives    Review of Systems  Constitutional:  Negative for chills and fever.  HENT:  Positive for congestion and sinus pain. Negative for ear discharge, ear pain and postnasal drip.   Eyes:  Positive for visual disturbance. Negative for pain.  Respiratory:  Negative for cough and shortness of breath.   Genitourinary:  Negative for dysuria and hematuria.  Musculoskeletal:  Negative for neck pain and neck stiffness.  Allergic/Immunologic: Positive for environmental allergies.  Neurological:  Negative for dizziness and weakness.      Objective:    Physical Exam Vitals  reviewed.  Constitutional:      General: She is not in acute distress.    Appearance: She is not diaphoretic.  HENT:     Head: Normocephalic and atraumatic.     Left Ear: There  is impacted cerumen.     Nose: Nose normal.     Mouth/Throat:     Mouth: Mucous membranes are moist.  Eyes:     General: No scleral icterus. Cardiovascular:     Rate and Rhythm: Normal rate and regular rhythm.     Pulses: Normal pulses.     Heart sounds: Normal heart sounds. No murmur heard. Pulmonary:     Breath sounds: Normal breath sounds. No wheezing or rales.  Musculoskeletal:     Cervical back: Neck supple. No tenderness.  Neurological:     General: No focal deficit present.     Mental Status: She is alert and oriented to person, place, and time.  Psychiatric:        Mood and Affect: Mood normal.        Behavior: Behavior normal.    BP (!) 154/92 (BP Location: Right Arm, Patient Position: Sitting, Cuff Size: Large)   Pulse 78   Ht 5' 6.5" (1.689 m)   Wt 148 lb (67.1 kg)   SpO2 96%   BMI 23.53 kg/m  Wt Readings from Last 3 Encounters:  08/01/21 148 lb (67.1 kg)  07/10/21 196 lb 1.9 oz (89 kg)  04/17/21 198 lb 6.4 oz (90 kg)    Health Maintenance Due  Topic Date Due   COLONOSCOPY (Pts 45-19yr Insurance coverage will need to be confirmed)  05/12/2021   INFLUENZA VACCINE  07/09/2021    There are no preventive care reminders to display for this patient.   Lab Results  Component Value Date   TSH 2.560 04/10/2021   Lab Results  Component Value Date   WBC 7.9 06/19/2020   HGB 13.1 06/19/2020   HCT 39.4 06/19/2020   MCV 95.9 06/19/2020   PLT 243 06/19/2020   Lab Results  Component Value Date   NA 143 04/16/2021   K 4.4 04/16/2021   CO2 22 04/16/2021   GLUCOSE 95 04/16/2021   BUN 18 04/16/2021   CREATININE 0.79 04/16/2021   BILITOT 0.3 04/16/2021   ALKPHOS 47 04/16/2021   AST 28 04/16/2021   ALT 32 04/16/2021   PROT 7.6 04/16/2021   ALBUMIN 4.4 04/16/2021   CALCIUM 9.2  04/16/2021   ANIONGAP 8 03/26/2015   EGFR 80 04/16/2021   Lab Results  Component Value Date   CHOL 186 04/16/2021   Lab Results  Component Value Date   HDL 74 04/16/2021   Lab Results  Component Value Date   LDLCALC 95 04/16/2021   Lab Results  Component Value Date   TRIG 93 04/16/2021   Lab Results  Component Value Date   CHOLHDL 2.5 04/16/2021   Lab Results  Component Value Date   HGBA1C 5.7 04/17/2021       Assessment & Plan:   Problem List Items Addressed This Visit       Respiratory   Allergic sinusitis    Continue Flonase Nasal saline spray PRN Advised to use humidifier at nighttime       Relevant Medications   fluticasone (FLONASE) 50 MCG/ACT nasal spray     Nervous and Auditory   Impacted cerumen of left ear - Primary    Doubt any foreign object/insect in the ear canal Poor visibility due to impacted cerumen - tried ear irrigation, but patient started having dizziness and had to stop after 2 trials Likely impacted cerumen causing discomfort Debrox ear drops for now Will refer to ENT specialist if persistent or worsening symptoms  Relevant Medications   carbamide peroxide (DEBROX) 6.5 % OTIC solution     Meds ordered this encounter  Medications   carbamide peroxide (DEBROX) 6.5 % OTIC solution    Sig: Place 5 drops into the left ear 2 (two) times daily.    Dispense:  15 mL    Refill:  0   fluticasone (FLONASE) 50 MCG/ACT nasal spray    Sig: Place 1 spray into both nostrils daily.    Dispense:  16 g    Refill:  0     Zakaiya Lares Keith Rake, MD

## 2021-08-01 NOTE — Assessment & Plan Note (Signed)
Doubt any foreign object/insect in the ear canal Poor visibility due to impacted cerumen - tried ear irrigation, but patient started having dizziness and had to stop after 2 trials Likely impacted cerumen causing discomfort Debrox ear drops for now Will refer to ENT specialist if persistent or worsening symptoms

## 2021-08-01 NOTE — Patient Instructions (Signed)
Please use debrox ear drops to help with ear wax.  Avoid using sharp objects for cleaning purposes.  Please use Flonase for allergic sinusitis. Use humidifier to avoid dry air related irritation of nostrils and throat.  Continue taking other medications as prescribed.

## 2021-08-01 NOTE — Assessment & Plan Note (Signed)
Continue Flonase Nasal saline spray PRN Advised to use humidifier at nighttime

## 2021-08-14 ENCOUNTER — Other Ambulatory Visit: Payer: Self-pay

## 2021-08-14 ENCOUNTER — Ambulatory Visit (INDEPENDENT_AMBULATORY_CARE_PROVIDER_SITE_OTHER): Payer: Medicare Other

## 2021-08-14 DIAGNOSIS — Z23 Encounter for immunization: Secondary | ICD-10-CM

## 2021-08-20 ENCOUNTER — Other Ambulatory Visit: Payer: Self-pay

## 2021-08-20 ENCOUNTER — Ambulatory Visit (INDEPENDENT_AMBULATORY_CARE_PROVIDER_SITE_OTHER): Payer: Self-pay | Admitting: *Deleted

## 2021-08-20 VITALS — Ht 67.0 in | Wt 197.0 lb

## 2021-08-20 DIAGNOSIS — Z1211 Encounter for screening for malignant neoplasm of colon: Secondary | ICD-10-CM

## 2021-08-20 MED ORDER — NA SULFATE-K SULFATE-MG SULF 17.5-3.13-1.6 GM/177ML PO SOLN: 1.0000 | Freq: Once | ORAL | 0 refills | Status: AC

## 2021-08-20 NOTE — Progress Notes (Signed)
Appropriate. ASA 2.  

## 2021-08-20 NOTE — Progress Notes (Signed)
Gastroenterology Pre-Procedure Review  Request Date: 08/20/2021 Requesting Physician: 10 year recall, Last TCS done 05/13/2011 by Dr. Oneida Alar, polypoid lesion  PATIENT REVIEW QUESTIONS: The patient responded to the following health history questions as indicated:    1. Diabetes Melitis: no 2. Joint replacements in the past 12 months: no 3. Major health problems in the past 3 months: no 4. Has an artificial valve or MVP: no 5. Has a defibrillator: no 6. Has been advised in past to take antibiotics in advance of a procedure like teeth cleaning: no 7. Family history of colon cancer: no  8. Alcohol Use: no 9. Illicit drug Use: no 10. History of sleep apnea: no  11. History of coronary artery or other vascular stents placed within the last 12 months: no 12. History of any prior anesthesia complications: no 13. Body mass index is 30.85 kg/m.    MEDICATIONS & ALLERGIES:    Patient reports the following regarding taking any blood thinners:   Plavix? no Aspirin? no Coumadin? no Brilinta? no Xarelto? no Eliquis? no Pradaxa? no Savaysa? no Effient? no  Patient confirms/reports the following medications:  Current Outpatient Medications  Medication Sig Dispense Refill   acetaminophen (TYLENOL) 650 MG CR tablet Take 650 mg by mouth as needed for pain.     alendronate (FOSAMAX) 70 MG tablet TAKE ONE TABLET BY MOUTH ON AN EMPTY STOMACH WITH A GLASS FULL OF WATER ONCE A WEEK 4 tablet 0   amLODipine (NORVASC) 10 MG tablet TAKE 1 TABLET BY MOUTH AT BEDTIME. (Patient taking differently: daily.) 30 tablet 0   betamethasone dipropionate 0.05 % cream Apply topically 2 (two) times daily. Apply twice daily to rash on left elbow for 7 days, then as needed (Patient taking differently: Apply topically as needed. Apply twice daily to rash on left elbow for 7 days, then as needed) 45 g 0   calcium-vitamin D (OSCAL-500) 500-400 MG-UNIT tablet Take 1 tablet by mouth 2 (two) times daily. 60 tablet 11    Cetirizine HCl 10 MG TBDP Take 1 tablet by mouth daily. 30 tablet 5   Cholecalciferol (VITAMIN D3) 50 MCG (2000 UT) capsule Take 2,000 Units by mouth daily.     fluticasone (FLONASE) 50 MCG/ACT nasal spray Place 1 spray into both nostrils daily. 16 g 0   levothyroxine (SYNTHROID) 100 MCG tablet Take 1 tablet (100 mcg total) by mouth daily. 90 tablet 3   lovastatin (MEVACOR) 40 MG tablet TAKE (1) TABLET BY MOUTH AT BEDTIME. 30 tablet 0   montelukast (SINGULAIR) 10 MG tablet TAKE (1) TABLET BY MOUTH AT BEDTIME. 90 tablet 0   PARoxetine (PAXIL) 20 MG tablet TAKE ONE TABLET BY MOUTH DAILY. (Patient taking differently: at bedtime.) 30 tablet 0   polyethylene glycol powder (GLYCOLAX/MIRALAX) 17 GM/SCOOP powder Take 17 g by mouth 2 (two) times daily as needed. 3350 g 1   SYMBICORT 80-4.5 MCG/ACT inhaler INHALE 2 PUFFS INTO THE LUNGS TWICE DAILY. 10.2 g 0   triamterene-hydrochlorothiazide (MAXZIDE-25) 37.5-25 MG tablet Take one and  A half tablets once daily for blood pressur (Patient taking differently: daily at 6 (six) AM. Takes 1/2 tablet daily.) 45 tablet 5   VENTOLIN HFA 108 (90 Base) MCG/ACT inhaler INHALE 2 PUFFS INTO THE LUNGS EVERY 4 HOURS AS NEEDED FOR COUGHNG ANDWHEEZING. 18 g 0   No current facility-administered medications for this visit.    Patient confirms/reports the following allergies:  Allergies  Allergen Reactions   Lotensin Hct [Benazepril-Hydrochlorothiazide] Hives    No orders  of the defined types were placed in this encounter.   AUTHORIZATION INFORMATION Primary Insurance: Medicare,  ID #: [id number] Pre-Cert / Auth required: No, not required  Secondary Insurance: Medicaid [place] Access,  ID #:  [id number] Pre-Cert / Josem Kaufmann required: No, not required  SCHEDULE INFORMATION: Procedure has been scheduled as follows:  Date: 09/11/2021, Time: 10:30  Location: [facility] with Dr. Abbey Chatters  This Gastroenterology Pre-Precedure Review Form is being routed to the following  provider(s): Roseanne Kaufman, NP

## 2021-08-20 NOTE — Patient Instructions (Signed)
[name]  11-18-1951 MRN: [id number]    Remember labs 09/07/2021 at [facility].   Procedure Date: 09/11/2021 Time to register: 9:00 AM Place to register: [place] [facility] Scheduled provider: Dr. Abbey Chatters    PREPARATION FOR COLONOSCOPY WITH SUPREP BOWEL PREP KIT  Note: Suprep Bowel Prep Kit is a split-dose (2day) regimen. Consumption of BOTH 6-ounce bottles is required for a complete prep.  Please notify us immediately if you are diabetic, take iron supplements, or if you are on Coumadin or any other blood thinners.  Weight loss medications must be held 7 days prior to your procedure.   Please notify us of any medication changes at least 7 days prior to your procedure.   Please hold the following medications: n/a                                                                                                                                                  2 DAYS BEFORE PROCEDURE:  DATE: 09/09/2021   DAY: Sunday Begin clear liquid diet AFTER your lunch meal. NO SOLID FOODS after this point.  1 DAY BEFORE PROCEDURE:  DATE: 09/10/2021   DAY: Monday Continue clear liquids the entire day - NO SOLID FOOD.   Diabetic medications adjustments for today: n/a  At 6:00pm: Complete steps 1 through 4 below, using ONE (1) 6-ounce bottle, before going to bed. Step 1:  Pour ONE (1) 6-ounce bottle of SUPREP liquid into the mixing container.  Step 2:  Add cool drinking water to the 16 ounce line on the container and mix.  Note: Dilute the solution concentrate as directed prior to use. Step 3:  DRINK ALL the liquid in the container. Step 4:  You MUST drink an additional two (2) or more 16 ounce containers of water over the next one (1) hour.   Continue clear liquids.  DAY OF PROCEDURE:   DATE: 09/11/2021   DAY: Tuesday If you take medications for your heart, blood pressure, or breathing, you may take these medications.  Diabetic medications adjustments for today: n/a  5 hours before your  procedure at 5:30 am : Step 1:  Pour ONE (1) 6-ounce bottle of SUPREP liquid into the mixing container.  Step 2:  Add cool drinking water to the 16 ounce line on the container and mix.  Note: Dilute the solution concentrate as directed prior to use. Step 3:  DRINK ALL the liquid in the container. Step 4:  You MUST drink an additional two (2) or more 16 ounce containers of water over the next one (1) hour. You MUST complete the final glass of water at least 3 hours before your colonoscopy. Nothing by mouth past 7:30 am.  You may take your morning medications with sip of water unless we have instructed otherwise.    Please see below for Dietary Information.  CLEAR LIQUIDS INCLUDE:  Water Jello (NOT red in color)   Ice Popsicles (NOT red in color)   Tea (sugar ok, no milk/cream) Powdered fruit flavored drinks  Coffee (sugar ok, no milk/cream) Gatorade/ Lemonade/ Kool-Aid  (NOT red in color)   Juice: apple, white grape, white cranberry Soft drinks  Clear bullion, consomme, broth (fat free beef/chicken/vegetable)  Carbonated beverages (any kind)  Strained chicken noodle soup Hard Candy   Remember: Clear liquids are liquids that will allow you to see your fingers on the other side of a clear glass. Be sure liquids are NOT red in color, and not cloudy, but CLEAR.  DO NOT EAT OR DRINK ANY OF THE FOLLOWING:  Dairy products of any kind   Cranberry juice Tomato juice / V8 juice   Grapefruit juice Orange juice     Red grape juice  Do not eat any solid foods, including such foods as: cereal, oatmeal, yogurt, fruits, vegetables, creamed soups, eggs, bread, crackers, pureed foods in a blender, etc.   HELPFUL HINTS FOR DRINKING PREP SOLUTION:  Make sure prep is extremely cold. Mix and refrigerate the the morning of the prep. You may also put in the freezer.  You may try mixing some Crystal Light or Country Time Lemonade if you prefer. Mix in small amounts; add more if necessary. Try drinking  through a straw Rinse mouth with water or a mouthwash between glasses, to remove after-taste. Try sipping on a cold beverage /ice/ popsicles between glasses of prep. Place a piece of sugar-free hard candy in mouth between glasses. If you become nauseated, try consuming smaller amounts, or stretch out the time between glasses. Stop for 30-60 minutes, then slowly start back drinking.     OTHER INSTRUCTIONS  You will need to have a responsible adult immediately available after your procedure to receive discharge instructions then drive you home. We strongly encourage your responsible adult to remain in the hospital during your procedure.  Your procedure will be canceled if a responsible adult is not available.  Wear loose fitting clothing that is easily removed. Leave jewelry and other valuables at home.  Remove all body piercing jewelry and leave at home. Total time from sign-in until discharge is approximately 2-3 hours. You should go home directly after your procedure and rest. You can resume normal activities the day after your procedure. The day of your procedure you should not: Drive Make legal decisions Operate machinery Drink alcohol Return to work   You may call the office (Dept: [phone]) before 5:00pm, or page the doctor on call [phone]) after 5:00pm, for further instructions, if necessary.   Insurance Information YOU WILL NEED TO CHECK WITH YOUR INSURANCE COMPANY FOR THE BENEFITS OF COVERAGE YOU HAVE FOR THIS PROCEDURE.  UNFORTUNATELY, NOT ALL INSURANCE COMPANIES HAVE BENEFITS TO COVER ALL OR PART OF THESE TYPES OF PROCEDURES.  IT IS YOUR RESPONSIBILITY TO CHECK YOUR BENEFITS, HOWEVER, WE WILL BE GLAD TO ASSIST YOU WITH ANY CODES YOUR INSURANCE COMPANY MAY NEED.    PLEASE NOTE THAT MOST INSURANCE COMPANIES WILL NOT COVER A SCREENING COLONOSCOPY FOR PEOPLE UNDER THE AGE OF 50  IF YOU HAVE BCBS INSURANCE, YOU MAY HAVE BENEFITS FOR A SCREENING COLONOSCOPY BUT IF  POLYPS ARE FOUND THE DIAGNOSIS WILL CHANGE AND THEN YOU MAY HAVE A DEDUCTIBLE THAT WILL NEED TO BE MET. SO PLEASE MAKE SURE YOU CHECK YOUR BENEFITS FOR A SCREENING COLONOSCOPY AS WELL AS A DIAGNOSTIC COLONOSCOPY.

## 2021-08-20 NOTE — Progress Notes (Signed)
Pt made aware to have labs drawn on 09/07/2021.

## 2021-08-22 ENCOUNTER — Other Ambulatory Visit: Payer: Self-pay | Admitting: *Deleted

## 2021-08-22 ENCOUNTER — Other Ambulatory Visit: Payer: Self-pay | Admitting: Family Medicine

## 2021-08-22 DIAGNOSIS — Z1211 Encounter for screening for malignant neoplasm of colon: Secondary | ICD-10-CM

## 2021-09-07 ENCOUNTER — Other Ambulatory Visit (HOSPITAL_COMMUNITY)
Admission: RE | Admit: 2021-09-07 | Discharge: 2021-09-07 | Disposition: A | Discharge status: Home / Self Care | Payer: Medicare Other | Source: Ambulatory Visit | Attending: Internal Medicine | Admitting: Internal Medicine

## 2021-09-07 DIAGNOSIS — Z1211 Encounter for screening for malignant neoplasm of colon: Secondary | ICD-10-CM | POA: Diagnosis present

## 2021-09-07 LAB — BASIC METABOLIC PANEL
Anion gap: 8 (ref 5–15)
BUN: 20 mg/dL (ref 8–23)
CO2: 27 mmol/L (ref 22–32)
Calcium: 9.2 mg/dL (ref 8.9–10.3)
Chloride: 103 mmol/L (ref 98–111)
Creatinine, Ser: 0.85 mg/dL (ref 0.44–1.00)
GFR, Estimated: >60 mL/min (ref >60)
Glucose, Bld: 97 mg/dL (ref 70–99)
Potassium: 4.2 mmol/L (ref 3.5–5.1)
Sodium: 138 mmol/L (ref 135–145)

## 2021-09-11 ENCOUNTER — Ambulatory Visit (HOSPITAL_COMMUNITY): Payer: Medicare Other | Admitting: Anesthesiology

## 2021-09-11 ENCOUNTER — Encounter (HOSPITAL_COMMUNITY): Payer: Self-pay

## 2021-09-11 ENCOUNTER — Ambulatory Visit (HOSPITAL_COMMUNITY)
Admission: RE | Admit: 2021-09-11 | Discharge: 2021-09-11 | Disposition: A | Discharge status: Home / Self Care | Payer: Medicare Other | Attending: Internal Medicine | Admitting: Internal Medicine

## 2021-09-11 ENCOUNTER — Other Ambulatory Visit: Payer: Self-pay

## 2021-09-11 ENCOUNTER — Encounter (HOSPITAL_COMMUNITY)
Admission: RE | Disposition: A | Discharge status: Home / Self Care | Payer: Self-pay | Source: Home / Self Care | Attending: Internal Medicine

## 2021-09-11 DIAGNOSIS — Z87891 Personal history of nicotine dependence: Secondary | ICD-10-CM | POA: Diagnosis not present

## 2021-09-11 DIAGNOSIS — Z7951 Long term (current) use of inhaled steroids: Secondary | ICD-10-CM | POA: Insufficient documentation

## 2021-09-11 DIAGNOSIS — K648 Other hemorrhoids: Secondary | ICD-10-CM | POA: Diagnosis not present

## 2021-09-11 DIAGNOSIS — Z79899 Other long term (current) drug therapy: Secondary | ICD-10-CM | POA: Diagnosis not present

## 2021-09-11 DIAGNOSIS — K635 Polyp of colon: Secondary | ICD-10-CM

## 2021-09-11 DIAGNOSIS — Z7983 Long term (current) use of bisphosphonates: Secondary | ICD-10-CM | POA: Diagnosis not present

## 2021-09-11 DIAGNOSIS — Z1211 Encounter for screening for malignant neoplasm of colon: Secondary | ICD-10-CM | POA: Diagnosis present

## 2021-09-11 DIAGNOSIS — D120 Benign neoplasm of cecum: Secondary | ICD-10-CM | POA: Insufficient documentation

## 2021-09-11 DIAGNOSIS — Z888 Allergy status to other drugs, medicaments and biological substances status: Secondary | ICD-10-CM | POA: Diagnosis not present

## 2021-09-11 DIAGNOSIS — K5730 Diverticulosis of large intestine without perforation or abscess without bleeding: Secondary | ICD-10-CM | POA: Diagnosis not present

## 2021-09-11 DIAGNOSIS — D123 Benign neoplasm of transverse colon: Secondary | ICD-10-CM | POA: Insufficient documentation

## 2021-09-11 DIAGNOSIS — Z79890 Hormone replacement therapy (postmenopausal): Secondary | ICD-10-CM | POA: Insufficient documentation

## 2021-09-11 HISTORY — PX: POLYPECTOMY: SHX5525

## 2021-09-11 HISTORY — PX: COLONOSCOPY WITH PROPOFOL: SHX5780

## 2021-09-11 SURGERY — COLONOSCOPY WITH PROPOFOL
Anesthesia: General

## 2021-09-11 MED ORDER — PROPOFOL 10 MG/ML IV BOLUS
INTRAVENOUS | Status: DC | PRN
  Administered 2021-09-11 (×3): 50 mg via INTRAVENOUS
  Administered 2021-09-11: 100 mg via INTRAVENOUS
  Administered 2021-09-11: 40 mg via INTRAVENOUS
  Administered 2021-09-11: 50 mg via INTRAVENOUS
  Administered 2021-09-11 (×3): 40 mg via INTRAVENOUS
  Administered 2021-09-11: 50 mg via INTRAVENOUS
  Administered 2021-09-11: 40 mg via INTRAVENOUS

## 2021-09-11 MED ORDER — STERILE WATER FOR IRRIGATION IR SOLN
Status: DC | PRN
  Administered 2021-09-11: 200 mL

## 2021-09-11 MED ORDER — GLYCOPYRROLATE 0.2 MG/ML IJ SOLN
INTRAMUSCULAR | Status: DC | PRN
  Administered 2021-09-11: .2 mg via INTRAVENOUS

## 2021-09-11 MED ORDER — GLYCOPYRROLATE PF 0.2 MG/ML IJ SOSY
PREFILLED_SYRINGE | INTRAMUSCULAR | Status: AC
  Filled 2021-09-11: qty 1

## 2021-09-11 MED ORDER — LACTATED RINGERS IV SOLN: INTRAVENOUS | Status: DC

## 2021-09-11 MED ORDER — LIDOCAINE HCL (CARDIAC) PF 100 MG/5ML IV SOSY
PREFILLED_SYRINGE | INTRAVENOUS | Status: DC | PRN
  Administered 2021-09-11: 50 mg via INTRAVENOUS

## 2021-09-11 NOTE — Anesthesia Postprocedure Evaluation (Signed)
Anesthesia Post Note  Patient: [name]  Procedure(s) Performed: COLONOSCOPY WITH PROPOFOL POLYPECTOMY  Patient location during evaluation: Phase II Anesthesia Type: General Level of consciousness: awake Pain management: pain level controlled Vital Signs Assessment: post-procedure vital signs reviewed and stable Respiratory status: spontaneous breathing and respiratory function stable Cardiovascular status: blood pressure returned to baseline and stable Postop Assessment: no headache and no apparent nausea or vomiting Anesthetic complications: no Comments: Late entry   No notable events documented.   Last Vitals:  Vitals:   09/11/21 0915 09/11/21 1134  BP: 124/74 114/71  Resp: 20 (!) 22  Temp: 36.9 C 36.6 C  SpO2: 97% 100%    Last Pain:  Vitals:   09/11/21 1134  TempSrc: Axillary  PainSc: 0-No pain                 Louann Sjogren

## 2021-09-11 NOTE — Anesthesia Preprocedure Evaluation (Signed)
Anesthesia Evaluation  Patient identified by MRN, date of birth, ID band Patient awake    Reviewed: Allergy & Precautions, H&P , NPO status , Patient's Chart, lab work & pertinent test results, reviewed documented beta blocker date and time   Airway Mallampati: II  TM Distance: >3 FB Neck ROM: full    Dental no notable dental hx.    Pulmonary asthma , former smoker,    Pulmonary exam normal breath sounds clear to auscultation       Cardiovascular Exercise Tolerance: Good hypertension, negative cardio ROS   Rhythm:regular Rate:Normal     Neuro/Psych  Headaches, PSYCHIATRIC DISORDERS Anxiety Depression  Neuromuscular disease    GI/Hepatic Neg liver ROS, GERD  Medicated,  Endo/Other  Hypothyroidism   Renal/GU negative Renal ROS  negative genitourinary   Musculoskeletal   Abdominal   Peds  Hematology negative hematology ROS (+)   Anesthesia Other Findings   Reproductive/Obstetrics negative OB ROS                             Anesthesia Physical Anesthesia Plan  ASA: 2  Anesthesia Plan: General   Post-op Pain Management:    Induction:   PONV Risk Score and Plan: Propofol infusion  Airway Management Planned:   Additional Equipment:   Intra-op Plan:   Post-operative Plan:   Informed Consent: I have reviewed the patients History and Physical, chart, labs and discussed the procedure including the risks, benefits and alternatives for the proposed anesthesia with the patient or authorized representative who has indicated his/her understanding and acceptance.     Dental Advisory Given  Plan Discussed with: CRNA  Anesthesia Plan Comments:         Anesthesia Quick Evaluation

## 2021-09-11 NOTE — Discharge Instructions (Addendum)
  Colonoscopy Discharge Instructions  Read the instructions outlined below and refer to this sheet in the next few weeks. These discharge instructions provide you with general information on caring for yourself after you leave the hospital. Your doctor may also give you specific instructions. While your treatment has been planned according to the most current medical practices available, unavoidable complications occasionally occur.   ACTIVITY You may resume your regular activity, but move at a slower pace for the next 24 hours.  Take frequent rest periods for the next 24 hours.  Walking will help get rid of the air and reduce the bloated feeling in your belly (abdomen).  No driving for 24 hours (because of the medicine (anesthesia) used during the test).   Do not sign any important legal documents or operate any machinery for 24 hours (because of the anesthesia used during the test).  NUTRITION Drink plenty of fluids.  You may resume your normal diet as instructed by your doctor.  Begin with a light meal and progress to your normal diet. Heavy or fried foods are harder to digest and may make you feel sick to your stomach (nauseated).  Avoid alcoholic beverages for 24 hours or as instructed.  MEDICATIONS You may resume your normal medications unless your doctor tells you otherwise.  WHAT YOU CAN EXPECT TODAY Some feelings of bloating in the abdomen.  Passage of more gas than usual.  Spotting of blood in your stool or on the toilet paper.  IF YOU HAD POLYPS REMOVED DURING THE COLONOSCOPY: No aspirin products for 7 days or as instructed.  No alcohol for 7 days or as instructed.  Eat a soft diet for the next 24 hours.  FINDING OUT THE RESULTS OF YOUR TEST Not all test results are available during your visit. If your test results are not back during the visit, make an appointment with your caregiver to find out the results. Do not assume everything is normal if you have not heard from your  caregiver or the medical facility. It is important for you to follow up on all of your test results.  SEEK IMMEDIATE MEDICAL ATTENTION IF: You have more than a spotting of blood in your stool.  Your belly is swollen (abdominal distention).  You are nauseated or vomiting.  You have a temperature over 101.  You have abdominal pain or discomfort that is severe or gets worse throughout the day.   Your colonoscopy revealed 2 polyp(s) which I removed successfully. Await pathology results, my office will contact you. I recommend repeating colonoscopy in 3 years for surveillance purposes. You also have diverticulosis and internal hemorrhoids. I would recommend increasing fiber in your diet or adding OTC Benefiber/Metamucil. Be sure to drink at least 4 to 6 glasses of water daily. Follow-up with GI as needed.  Message left with primary care doctor to schedule patient a follow-up appt tomorrow per Dr. Briant Cedar for irregular heart rate.    I hope you have a great rest of your week!  Elon Alas. Abbey Chatters, D.O. Gastroenterology and Hepatology [facility]

## 2021-09-11 NOTE — Progress Notes (Signed)
EKG obtained in recovery for possible Afib during procedure.  Monitor showing NSR post-op.  No signs of distress noted at present and pt denies SOB or chest discomfort. Dr. Briant Cedar in to review EKG and discuss with pt.  Advised pt to f/u with primary care tomorrow for evaluation.  Copy of EKG sent with pt.  Voicemail message left with Dr. Moshe Cipro (PCP) to call pt with appointment for tomorrow per Dr. Briant Cedar.

## 2021-09-11 NOTE — Anesthesia Procedure Notes (Signed)
Date/Time: 09/11/2021 10:40 AM Performed by: Orlie Dakin, CRNA Pre-anesthesia Checklist: Patient identified, Emergency Drugs available, Suction available and Patient being monitored Patient Re-evaluated:Patient Re-evaluated prior to induction Oxygen Delivery Method: Nasal cannula Induction Type: IV induction Placement Confirmation: positive ETCO2

## 2021-09-11 NOTE — Transfer of Care (Signed)
Immediate Anesthesia Transfer of Care Note  Patient: [name]  Procedure(s) Performed: COLONOSCOPY WITH PROPOFOL POLYPECTOMY  Patient Location: Endoscopy Unit  Anesthesia Type:General  Level of Consciousness: awake  Airway & Oxygen Therapy: Patient Spontanous Breathing  Post-op Assessment: Report given to RN and Post -op Vital signs reviewed and stable  Post vital signs: Reviewed and stable  Last Vitals:  Vitals Value Taken Time  BP 114/71 09/11/21 1134  Temp 36.6 C 09/11/21 1134  Pulse    Resp 22 09/11/21 1134  SpO2 100 % 09/11/21 1134    Last Pain:  Vitals:   09/11/21 1134  TempSrc: Axillary  PainSc: 0-No pain      Patients Stated Pain Goal: 5 (35/45/62 [id number])  Complications: No notable events documented.

## 2021-09-11 NOTE — H&P (Signed)
Primary Care Physician:  Fayrene Helper, MD Primary Gastroenterologist:  Dr. Abbey Chatters  Pre-Procedure History & Physical: HPI:  [name] is a 70 y.o. female is here for a colonoscopy for colon cancer screening purposes.  Patient denies any family history of colorectal cancer.  No melena or hematochezia.  No abdominal pain or unintentional weight loss.  No change in bowel habits.  Overall feels well from a GI standpoint.  Past Medical History:  Diagnosis Date   Allergy    Anxiety    Arthritis    Asthma    Hyperlipidemia    Hypertension    Vision abnormalities    states close to blindness due to cataracts    Past Surgical History:  Procedure Laterality Date   caesarean     cataracts     CESAREAN SECTION     TOTAL ABDOMINAL HYSTERECTOMY     prolapse    Prior to Admission medications   Medication Sig Start Date End Date Taking? Authorizing Provider  acetaminophen (TYLENOL) 650 MG CR tablet Take 650 mg by mouth as needed for pain.   Yes [provider]  alendronate (FOSAMAX) 70 MG tablet TAKE ONE TABLET BY MOUTH ON AN EMPTY STOMACH WITH A GLASS FULL OF WATER ONCE A WEEK 08/22/21  Yes Fayrene Helper, MD  amLODipine (NORVASC) 10 MG tablet TAKE 1 TABLET BY MOUTH AT BEDTIME. 08/22/21  Yes Fayrene Helper, MD  calcium-vitamin D (OSCAL-500) 500-400 MG-UNIT tablet Take 1 tablet by mouth 2 (two) times daily. 07/07/20  Yes Fayrene Helper, MD  Cetirizine HCl 10 MG TBDP Take 1 tablet by mouth daily. 04/16/21  Yes Fayrene Helper, MD  Cholecalciferol (VITAMIN D3) 50 MCG (2000 UT) capsule Take 2,000 Units by mouth daily.   Yes [provider]  fluticasone (FLONASE) 50 MCG/ACT nasal spray Place 1 spray into both nostrils daily. 08/01/21  Yes Lindell Spar, MD  levothyroxine (SYNTHROID) 100 MCG tablet Take 1 tablet (100 mcg total) by mouth daily. 04/17/21  Yes Cassandria Anger, MD  lovastatin (MEVACOR) 40 MG tablet TAKE (1) TABLET BY MOUTH AT BEDTIME.  08/22/21  Yes Fayrene Helper, MD  montelukast (SINGULAIR) 10 MG tablet TAKE (1) TABLET BY MOUTH AT BEDTIME. 05/22/21  Yes Fayrene Helper, MD  PARoxetine (PAXIL) 20 MG tablet TAKE ONE TABLET BY MOUTH DAILY. 08/22/21  Yes Fayrene Helper, MD  polyethylene glycol powder (GLYCOLAX/MIRALAX) 17 GM/SCOOP powder Take 17 g by mouth 2 (two) times daily as needed. 07/10/21  Yes Fayrene Helper, MD  SYMBICORT 80-4.5 MCG/ACT inhaler INHALE 2 PUFFS INTO THE LUNGS TWICE DAILY. 08/22/21  Yes Fayrene Helper, MD  triamterene-hydrochlorothiazide (MAXZIDE-25) 37.5-25 MG tablet Take one and  A half tablets once daily for blood pressur Patient taking differently: daily at 6 (six) AM. Takes 1/2 tablet daily. 04/16/21  Yes Fayrene Helper, MD  VENTOLIN HFA 108 [phone] Base) MCG/ACT inhaler INHALE 2 PUFFS INTO THE LUNGS EVERY 4 HOURS AS NEEDED FOR COUGHNG ANDWHEEZING. 06/10/19  Yes Perlie Mayo, NP  betamethasone dipropionate 0.05 % cream Apply topically 2 (two) times daily. Apply twice daily to rash on left elbow for 7 days, then as needed Patient taking differently: Apply topically as needed. Apply twice daily to rash on left elbow for 7 days, then as needed 11/15/20   Fayrene Helper, MD    Allergies as of 08/22/2021 - Review Complete 08/20/2021  Allergen Reaction Noted   Lotensin hct [benazepril-hydrochlorothiazide] Hives 05/01/2011  Family History  Problem Relation Age of Onset   Cancer Mother 42       stomach   Kidney disease Mother    Hypertension Mother    Diabetes Father    Cancer Father        lung    Diabetes Sister    Alcohol abuse Brother    Hyperlipidemia Sister    Diabetes Sister    Diabetes Sister    Diabetes Brother    Diabetes Brother    Diabetes Brother    Diabetes Brother    Cancer Daughter 42       cervix   Arthritis Other    Asthma Other     Social History   Socioeconomic History   Marital status: Single    Spouse name: Not on file   Number of children:  Not on file   Years of education: 12th grade   Highest education level: Not on file  Occupational History   Occupation: disabled    Employer: NOT EMPLOYED  Tobacco Use   Smoking status: Former    Packs/day: 0.50    Years: 18.00    Pack years: 9.00    Types: Cigarettes    Quit date: 1989    Years since quitting: 33.7   Smokeless tobacco: Never  Vaping Use   Vaping Use: Never used  Substance and Sexual Activity   Alcohol use: No   Drug use: No   Sexual activity: Not Currently  Other Topics Concern   Not on file  Social History Narrative   Not on file   Social Determinants of Health   Financial Resource Strain: Low Risk    Difficulty of Paying Living Expenses: Not hard at all  Food Insecurity: No Food Insecurity   Worried About Charity fundraiser in the Last Year: Never true   [place] in the Last Year: Never true  Transportation Needs: No Transportation Needs   Lack of Transportation (Medical): No   Lack of Transportation (Non-Medical): No  Physical Activity: Insufficiently Active   Days of Exercise per Week: 7 days   Minutes of Exercise per Session: 10 min  Stress: No Stress Concern Present   Feeling of Stress : Not at all  Social Connections: Moderately Isolated   Frequency of Communication with Friends and Family: More than three times a week   Frequency of Social Gatherings with Friends and Family: More than three times a week   Attends Religious Services: Never   Marine scientist or Organizations: Yes   Attends Music therapist: More than 4 times per year   Marital Status: Never married  Human resources officer Violence: Not At Risk   Fear of Current or Ex-Partner: No   Emotionally Abused: No   Physically Abused: No   Sexually Abused: No    Review of Systems: See HPI, otherwise negative ROS  Physical Exam: Vital signs in last 24 hours: Temp:  [98.4 F (36.9 C)] 98.4 F (36.9 C) (10/04 0915) Resp:  [20] 20 (10/04 0915) BP:  (124)/(74) 124/74 (10/04 0915) SpO2:  [97 %] 97 % (10/04 0915) Weight:  [88 kg] 88 kg (10/04 0905)   General:   Alert,  Well-developed, well-nourished, pleasant and cooperative in NAD Head:  Normocephalic and atraumatic. Eyes:  Sclera clear, no icterus.   Conjunctiva pink. Ears:  Normal auditory acuity. Nose:  No deformity, discharge,  or lesions. Mouth:  No deformity or lesions, dentition normal. Neck:  Supple; no  masses or thyromegaly. Lungs:  Clear throughout to auscultation.   No wheezes, crackles, or rhonchi. No acute distress. Heart:  Regular rate and rhythm; no murmurs, clicks, rubs,  or gallops. Abdomen:  Soft, nontender and nondistended. No masses, hepatosplenomegaly or hernias noted. Normal bowel sounds, without guarding, and without rebound.   Msk:  Symmetrical without gross deformities. Normal posture. Extremities:  Without clubbing or edema. Neurologic:  Alert and  oriented x4;  grossly normal neurologically. Skin:  Intact without significant lesions or rashes. Cervical Nodes:  No significant cervical adenopathy. Psych:  Alert and cooperative. Normal mood and affect.  Impression/Plan: [name] is here for a colonoscopy to be performed for colon cancer screening purposes.  The risks of the procedure including infection, bleed, or perforation as well as benefits, limitations, alternatives and imponderables have been reviewed with the patient. Questions have been answered. All parties agreeable.

## 2021-09-11 NOTE — Op Note (Signed)
[facility] Patient Name: [name] Procedure Date: 09/11/2021 10:24 AM MRN: [id number] Date of Birth: 11-Mar-1951 Attending MD: Elon Alas. Abbey Chatters DO CSN: [id number] Age: 70 Admit Type: Outpatient Procedure:                Colonoscopy Indications:              Screening for colorectal malignant neoplasm Providers:                Elon Alas. Abbey Chatters, DO, Lambert Mody, Dereck Leep, Technician Referring MD:              Medicines:                See the Anesthesia note for documentation of the                            administered medications Complications:            No immediate complications. Estimated Blood Loss:     Estimated blood loss was minimal. Procedure:                Pre-Anesthesia Assessment:                           - The anesthesia plan was to use monitored                            anesthesia care (MAC).                           After obtaining informed consent, the colonoscope                            was passed under direct vision. Throughout the                            procedure, the patient's blood pressure, pulse, and                            oxygen saturations were monitored continuously. The                            [id number]-HQ190L ([id number]) scope was introduced through                            the anus and advanced to the the cecum, identified                            by appendiceal orifice and ileocecal valve. The                            colonoscopy was technically difficult and complex                            due to a redundant colon  and significant looping.                            The patient tolerated the procedure well. The                            quality of the bowel preparation was evaluated                            using the BBPS [facility] Bowel Preparation Scale)                            with scores of: Right Colon = 3, Transverse Colon =                            3 and  Left Colon = 3 (entire mucosa seen well with                            no residual staining, small fragments of stool or                            opaque liquid). The total BBPS score equals 9. Scope In: 10:39:26 AM Scope Out: 11:28:03 AM Scope Withdrawal Time: 0 hours 42 minutes 49 seconds  Total Procedure Duration: 0 hours 48 minutes 37 seconds  Findings:      The perianal and digital rectal examinations were normal.      Non-bleeding internal hemorrhoids were found during endoscopy.      Multiple small and large-mouthed diverticula were found in the sigmoid       colon.      A 12 mm polyp was found in the cecum. The polyp was flat. The polyp was       removed with a cold snare. Resection and retrieval were complete.      A 10 mm polyp was found in the transverse colon. The polyp was sessile.       The polyp was removed with a cold snare. Resection and retrieval were       complete. Impression:               - Non-bleeding internal hemorrhoids.                           - Diverticulosis in the sigmoid colon.                           - One 12 mm polyp in the cecum, removed with a cold                            snare. Resected and retrieved.                           - One 10 mm polyp in the transverse colon, removed                            with a cold snare. Resected and retrieved. Moderate Sedation:  Per Anesthesia Care Recommendation:           - Patient has a contact number available for                            emergencies. The signs and symptoms of potential                            delayed complications were discussed with the                            patient. Return to normal activities tomorrow.                            Written discharge instructions were provided to the                            patient.                           - Resume previous diet.                           - Continue present medications.                           - Await pathology  results.                           - Repeat colonoscopy in 3 years for surveillance.                           - Return to GI clinic PRN. Procedure Code(s):        --- Professional ---                           [phone], Colonoscopy, flexible; with removal of                            tumor(s), polyp(s), or other lesion(s) by snare                            technique Diagnosis Code(s):        --- Professional ---                           Z12.11, Encounter for screening for malignant                            neoplasm of colon                           K64.8, Other hemorrhoids                           K63.5, Polyp of colon  K57.30, Diverticulosis of large intestine without                            perforation or abscess without bleeding CPT copyright 2019 American Medical Association. All rights reserved. The codes documented in this report are preliminary and upon coder review may  be revised to meet current compliance requirements. Elon Alas. Abbey Chatters, DO [name], DO 09/11/2021 11:33:26 AM This report has been signed electronically. Number of Addenda: 0

## 2021-09-12 ENCOUNTER — Encounter: Payer: Self-pay | Admitting: Family Medicine

## 2021-09-12 ENCOUNTER — Ambulatory Visit (INDEPENDENT_AMBULATORY_CARE_PROVIDER_SITE_OTHER): Payer: Medicare Other | Admitting: Family Medicine

## 2021-09-12 VITALS — BP 132/75 | HR 67 | Resp 16 | Ht 67.0 in | Wt 193.0 lb

## 2021-09-12 DIAGNOSIS — Z8249 Family history of ischemic heart disease and other diseases of the circulatory system: Secondary | ICD-10-CM

## 2021-09-12 DIAGNOSIS — E669 Obesity, unspecified: Secondary | ICD-10-CM

## 2021-09-12 DIAGNOSIS — R9431 Abnormal electrocardiogram [ECG] [EKG]: Secondary | ICD-10-CM

## 2021-09-12 DIAGNOSIS — I10 Essential (primary) hypertension: Secondary | ICD-10-CM

## 2021-09-12 DIAGNOSIS — E785 Hyperlipidemia, unspecified: Secondary | ICD-10-CM

## 2021-09-12 DIAGNOSIS — R7303 Prediabetes: Secondary | ICD-10-CM

## 2021-09-12 LAB — SURGICAL PATHOLOGY

## 2021-09-12 NOTE — Assessment & Plan Note (Signed)
Hyperlipidemia:Low fat diet discussed and encouraged.   Lipid Panel  Lab Results  Component Value Date   CHOL 186 04/16/2021   HDL 74 04/16/2021   LDLCALC 95 04/16/2021   TRIG 93 04/16/2021   CHOLHDL 2.5 04/16/2021     Controlled, no change in medication

## 2021-09-12 NOTE — Assessment & Plan Note (Signed)
Patient educated about the importance of limiting  Carbohydrate intake , the need to commit to daily physical activity for a minimum of 30 minutes , and to commit weight loss. The fact that changes in all these areas will reduce or eliminate all together the development of diabetes is stressed.   Diabetic Labs Latest Ref Rng & Units 09/07/2021 04/17/2021 04/16/2021 06/19/2020 06/15/2019  HbA1c 0.0 - 7.0 % - 5.7 - - 5.6  Chol 100 - 199 mg/dL - - 186 194 188  HDL >39 mg/dL - - 74 59 59  Calc LDL 0 - 99 mg/dL - - 95 110(H) 105(H)  Triglycerides 0 - 149 mg/dL - - 93 137 143  Creatinine 0.44 - 1.00 mg/dL 0.85 - 0.79 0.96(H) 0.79   BP/Weight 09/12/2021 09/11/2021 08/20/2021 08/01/2021 07/10/2021 06/13/2021 1/00/7121  Systolic BP 975 883 - 254 982 - 641  Diastolic BP 75 71 - 92 76 - 82  Wt. (Lbs) 193 194 197 148 196.12 - 198.4  BMI 30.23 30.38 30.85 23.53 30.72 - 31.07   No flowsheet data found.

## 2021-09-12 NOTE — Assessment & Plan Note (Signed)
Controlled, no change in medication  

## 2021-09-12 NOTE — Assessment & Plan Note (Signed)
Improved. Pt applauded on succesful weight loss through lifestyle change, and encouraged to continue same. Weight loss goal set for the next several months.  

## 2021-09-12 NOTE — Patient Instructions (Signed)
F/u as before, call if you neeed me sooner  You are referred to Cardiology, regarding abnormal eKG with f/h of coronary artery disease  Thanks for choosing [facility], we consider it a privelige to serve you.

## 2021-09-12 NOTE — Assessment & Plan Note (Signed)
Abn ekg,non specific st elevation, 4 siblings have CAD, youngest at age 71, refer card, pt with risk factors also

## 2021-09-12 NOTE — Progress Notes (Signed)
ZAKARIAH DEJARNETTE     MRN: [id number]      DOB: 02/12/1951   HPI Ms. Elks is here for follow up from colonscopy yesterday, when patient states she was advised that her eKG was not totally normal and that she should  f/u with her pCP. On review, there is no change since last EKG in 2017 She denies chest pain,palpitations, pND , orthopnea or leg swelling. Has positive f/h of CAD in 4 siblings  and she has sevral personal risk factors for CAD Awaiting results of 2 polyps ROS Denies recent fever or chills. Denies sinus pressure, nasal congestion, ear pain or sore throat. Denies chest congestion, productive cough or wheezing. Denies chest pains, palpitations and leg swelling Denies abdominal pain, nausea, vomiting,diarrhea or constipation.   Denies dysuria, frequency, hesitancy or incontinence. Chronic joint pain, swelling and limitation in mobility. Denies headaches, seizures, numbness, or tingling. Denies depression, anxiety or insomnia. Denies skin break down or rash.   PE  BP 132/75   Pulse 67   Resp 16   Ht 5\' 7"  (1.702 m)   Wt 193 lb (87.5 kg)   SpO2 95%   BMI 30.23 kg/m   Patient alert and oriented and in no cardiopulmonary distress.  HEENT: No facial asymmetry, EOMI,     Neck supple .  Chest: Clear to auscultation bilaterally.  CVS: S1, S2 no murmurs, no S3.Regular rate. EKG: non specific st elevation MS: decreased  ROM spine, shoulders, hips and knees.  Skin: Intact, no ulcerations or rash noted.  Psych: Good eye contact, normal affect. Memory intact not anxious or depressed appearing.  CNS: CN 2-12 intact, power,  normal throughout.no focal deficits noted.   Assessment & Plan  Abnormal EKG Abn ekg,non specific st elevation, 4 siblings have CAD, youngest at age 71, refer card, pt with risk factors also  Essential hypertension Controlled, no change in medication   Obesity (BMI 30.0-34.9) Improved. Pt applauded on succesful weight loss through  lifestyle change, and encouraged to continue same. Weight loss goal set for the next several months.   Prediabetes Patient educated about the importance of limiting  Carbohydrate intake , the need to commit to daily physical activity for a minimum of 30 minutes , and to commit weight loss. The fact that changes in all these areas will reduce or eliminate all together the development of diabetes is stressed.   Diabetic Labs Latest Ref Rng & Units 09/07/2021 04/17/2021 04/16/2021 06/19/2020 06/15/2019  HbA1c 0.0 - 7.0 % - 5.7 - - 5.6  Chol 100 - 199 mg/dL - - 186 194 188  HDL >39 mg/dL - - 74 59 59  Calc LDL 0 - 99 mg/dL - - 95 110(H) 105(H)  Triglycerides 0 - 149 mg/dL - - 93 137 143  Creatinine 0.44 - 1.00 mg/dL 0.85 - 0.79 0.96(H) 0.79   BP/Weight 09/12/2021 09/11/2021 08/20/2021 08/01/2021 07/10/2021 06/13/2021 07/26/2992  Systolic BP 716 967 - 893 810 - 175  Diastolic BP 75 71 - 92 76 - 82  Wt. (Lbs) 193 194 197 148 196.12 - 198.4  BMI 30.23 30.38 30.85 23.53 30.72 - 31.07   No flowsheet data found.    Hyperlipidemia LDL goal <100 Hyperlipidemia:Low fat diet discussed and encouraged.   Lipid Panel  Lab Results  Component Value Date   CHOL 186 04/16/2021   HDL 74 04/16/2021   LDLCALC 95 04/16/2021   TRIG 93 04/16/2021   CHOLHDL 2.5 04/16/2021     Controlled, no change  in medication

## 2021-09-15 ENCOUNTER — Other Ambulatory Visit: Payer: Self-pay | Admitting: Family Medicine

## 2021-09-17 ENCOUNTER — Other Ambulatory Visit: Payer: Self-pay | Admitting: Family Medicine

## 2021-09-18 ENCOUNTER — Encounter (HOSPITAL_COMMUNITY): Payer: Self-pay | Admitting: Internal Medicine

## 2021-09-24 ENCOUNTER — Telehealth: Payer: Self-pay | Admitting: Internal Medicine

## 2021-09-24 NOTE — Telephone Encounter (Signed)
Pt returning call regarding results [phone]

## 2021-09-24 NOTE — Telephone Encounter (Signed)
Returned the pt's call and advised of result note.

## 2021-10-19 ENCOUNTER — Other Ambulatory Visit: Payer: Self-pay | Admitting: Family Medicine

## 2021-10-31 ENCOUNTER — Other Ambulatory Visit: Payer: Self-pay

## 2021-10-31 ENCOUNTER — Ambulatory Visit (INDEPENDENT_AMBULATORY_CARE_PROVIDER_SITE_OTHER): Payer: Medicare Other | Admitting: Internal Medicine

## 2021-10-31 ENCOUNTER — Encounter: Payer: Self-pay | Admitting: Internal Medicine

## 2021-10-31 VITALS — BP 144/82 | HR 84 | Ht 67.0 in | Wt 195.0 lb

## 2021-10-31 DIAGNOSIS — I10 Essential (primary) hypertension: Secondary | ICD-10-CM

## 2021-10-31 DIAGNOSIS — E782 Mixed hyperlipidemia: Secondary | ICD-10-CM | POA: Diagnosis not present

## 2021-10-31 DIAGNOSIS — R9431 Abnormal electrocardiogram [ECG] [EKG]: Secondary | ICD-10-CM | POA: Diagnosis not present

## 2021-10-31 NOTE — Patient Instructions (Signed)
Medication Instructions:  No Changes In Medications at this time.  *If you need a refill on your cardiac medications before your next appointment, please call your pharmacy*  Follow-Up: At [facility], you and your health needs are our priority.  As part of our continuing mission to provide you with exceptional heart care, we have created designated Provider Care Teams.  These Care Teams include your primary Cardiologist (physician) and Advanced Practice Providers (APPs -  Physician Assistants and Nurse Practitioners) who all work together to provide you with the care you need, when you need it.  We recommend signing up for the patient portal called "MyChart".  Sign up information is provided on this After Visit Summary.  MyChart is used to connect with patients for Virtual Visits (Telemedicine).  Patients are able to view lab/test results, encounter notes, upcoming appointments, etc.  Non-urgent messages can be sent to your provider as well.   To learn more about what you can do with MyChart, go to [email or web address].    Your next appointment:   AS NEEDED   The format for your next appointment:   In Person  Provider:   Dr. Debara Pickett

## 2021-10-31 NOTE — Progress Notes (Signed)
OFFICE CONSULT NOTE  Chief Complaint:  Possible A. fib  Primary Care Physician: Fayrene Helper, MD  HPI:  [name] is a 70 y.o. female who is being seen today for the evaluation of possible A. fib at the request of Fayrene Helper, MD. this is a pleasant 70 year old female who was recently seen by Dr. Moshe Cipro and follow-up after undergoing elective colonoscopy.  She had periprocedural EKG performed which was irregular concerning for possible A. fib.  2 EKGs, in fact were performed on September 11, 2021.  I personally reviewed both studies including the earlier study at 11:54 AM which shows significant baseline tremor artifact.  The rhythm was however regular and is a sinus rhythm.  The subsequent EKG at 12:16 PM demonstrated sinus rhythm with sinus arrhythmia.  There was additionally baseline artifact however clear P waves are noted in lead II with what are likely blocked PACs.  This is not atrial fibrillation.  Anesthesia however had this concern and Dr. Moshe Cipro had referred her to [place] for further evaluation.  There is a strong family history of heart disease including multiple family members with coronary disease.  Her risk factors include age, hypertension, and dyslipidemia he is completely asymptomatic, however denying any chest pain or significant shortness of breath.  She does have seasonal allergies and asthma/reactive airway disease.  She also has hypothyroidism managed by Dr. Dorris Fetch.  Blood pressure was reasonably well controlled.  Her last lipid profile showed total cholesterol of 186, triglycerides 93, HDL 74 and LDL 95.  Overall this is favorable on lovastatin 40 mg daily.  PMHx:  Past Medical History:  Diagnosis Date   Allergy    Anxiety    Arthritis    Asthma    Hyperlipidemia    Hypertension    Vision abnormalities    states close to blindness due to cataracts    Past Surgical History:  Procedure Laterality Date   caesarean     cataracts     CESAREAN SECTION      COLONOSCOPY WITH PROPOFOL N/A 09/11/2021   Procedure: COLONOSCOPY WITH PROPOFOL;  Surgeon: Eloise Harman, DO;  [facility];  Service: Endoscopy;  Laterality: N/A;  10:30 / ASA II   POLYPECTOMY  09/11/2021   Procedure: POLYPECTOMY;  Surgeon: Eloise Harman, DO;  Location: [facility];  Service: Endoscopy;;   TOTAL ABDOMINAL HYSTERECTOMY     prolapse    FAMHx:  Family History  Problem Relation Age of Onset   Cancer Mother 16       stomach   Kidney disease Mother    Hypertension Mother    Diabetes Father    Cancer Father        lung    Diabetes Sister    Alcohol abuse Brother    Hyperlipidemia Sister    Diabetes Sister    Diabetes Sister    Diabetes Brother    Diabetes Brother    Diabetes Brother    Diabetes Brother    Cancer Daughter 71       cervix   Arthritis Other    Asthma Other     SOCHx:   reports that she quit smoking about 33 years ago. Her smoking use included cigarettes. She has a 9.00 pack-year smoking history. She has never used smokeless tobacco. She reports that she does not drink alcohol and does not use drugs.  ALLERGIES:  Allergies  Allergen Reactions   Lotensin Hct [Benazepril-Hydrochlorothiazide] Hives  ROS: Pertinent items noted in HPI and remainder of comprehensive ROS otherwise negative.  HOME MEDS: Current Outpatient Medications on File Prior to Visit  Medication Sig Dispense Refill   acetaminophen (TYLENOL) 650 MG CR tablet Take 650 mg by mouth as needed for pain.     alendronate (FOSAMAX) 70 MG tablet TAKE ONE TABLET BY MOUTH ON AN EMPTY STOMACH WITH A GLASS FULL OF WATER ONCE A WEEK 4 tablet 0   amLODipine (NORVASC) 10 MG tablet TAKE 1 TABLET BY MOUTH AT BEDTIME. 30 tablet 0   betamethasone dipropionate 0.05 % cream Apply topically 2 (two) times daily. Apply twice daily to rash on left elbow for 7 days, then as needed (Patient taking differently: Apply topically as needed. Apply twice daily to rash on left elbow for  7 days, then as needed) 45 g 0   calcium-vitamin D (OSCAL-500) 500-400 MG-UNIT tablet Take 1 tablet by mouth 2 (two) times daily. 60 tablet 11   cetirizine (ZYRTEC) 10 MG tablet TAKE (1) TABLET BY MOUTH DAILY. 30 tablet 0   Cholecalciferol (VITAMIN D3) 50 MCG (2000 UT) capsule Take 2,000 Units by mouth daily.     fluticasone (FLONASE) 50 MCG/ACT nasal spray Place 1 spray into both nostrils daily. 16 g 0   levothyroxine (SYNTHROID) 100 MCG tablet Take 1 tablet (100 mcg total) by mouth daily. 90 tablet 3   lovastatin (MEVACOR) 40 MG tablet TAKE (1) TABLET BY MOUTH AT BEDTIME. 30 tablet 0   meloxicam (MOBIC) 7.5 MG tablet TAKE ONE TABLET BY MOUTH ONCE DAILY. 30 tablet 0   montelukast (SINGULAIR) 10 MG tablet TAKE (1) TABLET BY MOUTH AT BEDTIME. 90 tablet 0   PARoxetine (PAXIL) 20 MG tablet TAKE ONE TABLET BY MOUTH DAILY. 30 tablet 0   polyethylene glycol powder (GLYCOLAX/MIRALAX) 17 GM/SCOOP powder Take 17 g by mouth 2 (two) times daily as needed. 3350 g 1   SYMBICORT 80-4.5 MCG/ACT inhaler INHALE 2 PUFFS INTO THE LUNGS TWICE DAILY. 10.2 g 0   triamterene-hydrochlorothiazide (MAXZIDE-25) 37.5-25 MG tablet TAKE 1 1/2 TABLET BY MOUTH ONCEDAILY. 45 tablet 0   VENTOLIN HFA 108 (90 Base) MCG/ACT inhaler INHALE 2 PUFFS INTO THE LUNGS EVERY 4 HOURS AS NEEDED FOR COUGHNG ANDWHEEZING. 18 g 0   No current facility-administered medications on file prior to visit.    LABS/IMAGING: No results found for this or any previous visit (from the past 48 hour(s)). No results found.  LIPID PANEL:    Component Value Date/Time   CHOL 186 04/16/2021 1058   TRIG 93 04/16/2021 1058   HDL 74 04/16/2021 1058   CHOLHDL 2.5 04/16/2021 1058   CHOLHDL 3.3 06/19/2020 1218   VLDL 13 04/21/2017 0846   LDLCALC 95 04/16/2021 1058   LDLCALC 110 (H) 06/19/2020 1218    WEIGHTS: Wt Readings from Last 3 Encounters:  10/31/21 195 lb (88.5 kg)  09/12/21 193 lb (87.5 kg)  09/11/21 194 lb (88 kg)    VITALS: BP (!) 144/82    Pulse 84   Ht 5\' 7"  (1.702 m)   Wt 195 lb (88.5 kg)   SpO2 99%   BMI 30.54 kg/m   EXAM: General appearance: alert, no distress, and visually impaired Neck: no carotid bruit, no JVD, and thyroid not enlarged, symmetric, no tenderness/mass/nodules Lungs: clear to auscultation bilaterally Heart: regular rate and rhythm Abdomen: soft, non-tender; bowel sounds normal; no masses,  no organomegaly Extremities: extremities normal, atraumatic, no cyanosis or edema Pulses: 2+ and symmetric Skin: Skin color, texture,  turgor normal. No rashes or lesions Neurologic: Grossly normal Psych: Pleasant  EKG: Normal sinus rhythm at 70, poor R wave progression anterior- personally reviewed  ASSESSMENT: Abnormal perioperative EKG with blocked PACs Hypertension Dyslipidemia Family history of coronary disease  PLAN: 1.   Ms. Goldsmith had an abnormal EKG demonstrating blocked PACs but both EKGs demonstrate sinus rhythm.  This could have been related to anesthesia.  EKG today also shows sinus rhythm.  I do not note any atrial fibrillation and she has no complaints of palpitations, chest pain or worsening shortness of breath.  Although she has family history of heart disease and risk factors including age, hypertension and dyslipidemia, they appear to be well treated with LDL less than 100, fairly high HDL cholesterol and treated hypertension.  I do not believe she needs any further testing at this time.  Would continue good preventative medical therapy.  Happy to see her back as needed should she develop any symptomatic palpitations.  Thanks again for the kind referral.  Pixie Casino, MD, FACC, [employer] Director of the [facility]  [facility] Diplomate of the American Board of Clinical Lipidology Attending Cardiologist  Direct Dial: [phone]  Fax: [phone]  Website:  [email or web address]  Kosisochukwu Goldberg 10/31/2021, 10:36 AM

## 2021-11-09 ENCOUNTER — Ambulatory Visit: Payer: Medicare Other | Admitting: Family Medicine

## 2021-11-20 ENCOUNTER — Other Ambulatory Visit: Payer: Self-pay

## 2021-11-20 ENCOUNTER — Encounter: Payer: Self-pay | Admitting: Family Medicine

## 2021-11-20 ENCOUNTER — Ambulatory Visit (INDEPENDENT_AMBULATORY_CARE_PROVIDER_SITE_OTHER): Payer: Medicare Other | Admitting: Family Medicine

## 2021-11-20 ENCOUNTER — Other Ambulatory Visit: Payer: Self-pay | Admitting: Family Medicine

## 2021-11-20 VITALS — BP 132/83 | HR 82 | Resp 17 | Ht 67.0 in | Wt 197.0 lb

## 2021-11-20 DIAGNOSIS — F324 Major depressive disorder, single episode, in partial remission: Secondary | ICD-10-CM

## 2021-11-20 DIAGNOSIS — I10 Essential (primary) hypertension: Secondary | ICD-10-CM

## 2021-11-20 DIAGNOSIS — E785 Hyperlipidemia, unspecified: Secondary | ICD-10-CM

## 2021-11-20 DIAGNOSIS — H548 Legal blindness, as defined in USA: Secondary | ICD-10-CM

## 2021-11-20 DIAGNOSIS — E038 Other specified hypothyroidism: Secondary | ICD-10-CM | POA: Diagnosis not present

## 2021-11-20 MED ORDER — TRIAMTERENE-HCTZ 37.5-25 MG PO TABS: ORAL_TABLET | ORAL | 11 refills | Status: DC

## 2021-11-20 MED ORDER — PAROXETINE HCL 20 MG PO TABS: 20.0000 mg | ORAL_TABLET | Freq: Every day | ORAL | 11 refills | Status: DC

## 2021-11-20 MED ORDER — TRIAMTERENE-HCTZ 37.5-25 MG PO TABS: ORAL_TABLET | ORAL | 5 refills | Status: DC

## 2021-11-20 MED ORDER — LOVASTATIN 40 MG PO TABS: ORAL_TABLET | ORAL | 11 refills | Status: DC

## 2021-11-20 MED ORDER — ALENDRONATE SODIUM 70 MG PO TABS: ORAL_TABLET | ORAL | 11 refills | Status: DC

## 2021-11-20 MED ORDER — AMLODIPINE BESYLATE 10 MG PO TABS: 10.0000 mg | ORAL_TABLET | Freq: Every day | ORAL | 11 refills | Status: DC

## 2021-11-20 MED ORDER — CETIRIZINE HCL 10 MG PO TABS: ORAL_TABLET | ORAL | 11 refills | Status: DC

## 2021-11-20 MED ORDER — MONTELUKAST SODIUM 10 MG PO TABS: ORAL_TABLET | ORAL | 11 refills | Status: DC

## 2021-11-20 NOTE — Patient Instructions (Addendum)
F/U in 5 months, call if you need me sooner  Nurse please call and arrange pill packing of her medication  Lipid, cmp and EGFr and CBC today  Careful not to fall  For blood pressure, amlodipine 10 mg one daily and maxzide half daily  Thanks for choosing [facility], we consider it a privelige to serve you.

## 2021-11-21 LAB — CMP14+EGFR
ALT: 31 IU/L (ref 0–32)
AST: 27 IU/L (ref 0–40)
Albumin/Globulin Ratio: 1.2 (ref 1.2–2.2)
Albumin: 4 g/dL (ref 3.7–4.7)
Alkaline Phosphatase: 47 IU/L (ref 44–121)
BUN/Creatinine Ratio: 22 (ref 12–28)
BUN: 17 mg/dL (ref 8–27)
Bilirubin Total: 0.2 mg/dL (ref 0.0–1.2)
CO2: 21 mmol/L (ref 20–29)
Calcium: 9.2 mg/dL (ref 8.7–10.3)
Chloride: 100 mmol/L (ref 96–106)
Creatinine, Ser: 0.77 mg/dL (ref 0.57–1.00)
Globulin, Total: 3.4 g/dL (ref 1.5–4.5)
Glucose: 79 mg/dL (ref 70–99)
Potassium: 4.3 mmol/L (ref 3.5–5.2)
Sodium: 136 mmol/L (ref 134–144)
Total Protein: 7.4 g/dL (ref 6.0–8.5)
eGFR: 82 mL/min/{1.73_m2} (ref >59)

## 2021-11-21 LAB — LIPID PANEL
Chol/HDL Ratio: 3 ratio (ref 0.0–4.4)
Cholesterol, Total: 189 mg/dL (ref 100–199)
HDL: 64 mg/dL (ref >39)
LDL Chol Calc (NIH): 108 mg/dL — ABNORMAL HIGH (ref 0–99)
Triglycerides: 94 mg/dL (ref 0–149)
VLDL Cholesterol Cal: 17 mg/dL (ref 5–40)

## 2021-11-21 LAB — CBC
Hematocrit: 40 % (ref 34.0–46.6)
Hemoglobin: 13.4 g/dL (ref 11.1–15.9)
MCH: 31.8 pg (ref 26.6–33.0)
MCHC: 33.5 g/dL (ref 31.5–35.7)
MCV: 95 fL (ref 79–97)
Platelets: 260 10*3/uL (ref 150–450)
RBC: 4.21 x10E6/uL (ref 3.77–5.28)
RDW: 12.8 % (ref 11.7–15.4)
WBC: 7.6 10*3/uL (ref 3.4–10.8)

## 2021-11-21 NOTE — Progress Notes (Signed)
Tried to call pt with no answer

## 2021-11-28 ENCOUNTER — Encounter: Payer: Self-pay | Admitting: Family Medicine

## 2021-12-02 NOTE — Assessment & Plan Note (Signed)
Home safety and fall risk reduction reviewed

## 2021-12-02 NOTE — Progress Notes (Signed)
° °  [name]     MRN: [id number]      DOB: 08-Dec-1951   HPI [name] is here for follow up and re-evaluation of chronic medical conditions, medication management and review of any available recent lab and radiology data.  Preventive health is updated, specifically  Cancer screening and Immunization.   Questions or concerns regarding consultations or procedures which the PT has had in the interim are  addressed. The PT denies any adverse reactions to current medications since the last visit.  There are no new concerns.  There are no specific complaints   ROS Denies recent fever or chills. Denies sinus pressure, nasal congestion, ear pain or sore throat. Denies chest congestion, productive cough or wheezing. Denies chest pains, palpitations and leg swelling Denies abdominal pain, nausea, vomiting,diarrhea or constipation.   Denies dysuria, frequency, hesitancy or incontinence.  Denies headaches, seizures, numbness, or tingling. Denies depression, anxiety or insomnia. Denies skin break down or rash.   PE  BP 132/83    Pulse 82    Resp 17    Ht 5\' 7"  (1.702 m)    Wt 197 lb 0.6 oz (89.4 kg)    SpO2 93%    BMI 30.86 kg/m   Patient alert and oriented and in no cardiopulmonary distress.  HEENT: No facial asymmetry, EOMI,     Neck supple .  Chest: Clear to auscultation bilaterally.  CVS: S1, S2 no murmurs, no S3.Regular rate.  ABD: Soft non tender.   Ext: No edema  MS: decreased  ROM spine, shoulders, hips and knees.  Skin: Intact, no ulcerations or rash noted.  Psych: Good eye contact, normal affect. Memory intact not anxious or depressed appearing.  CNS: CN 2-12 intact, power,  normal throughout.no focal deficits noted.   Assessment & Plan  Essential hypertension Controlled, no change in medication DASH diet and commitment to daily physical activity for a minimum of 30 minutes discussed and encouraged, as a part of hypertension management. The importance of  attaining a healthy weight is also discussed.  BP/Weight 11/20/2021 10/31/2021 09/12/2021 09/11/2021 08/20/2021 5/64/3329 04/08/8840  Systolic BP 660 630 160 109 - 323 557  Diastolic BP 83 82 75 71 - 92 76  Wt. (Lbs) 197.04 195 193 194 197 148 196.12  BMI 30.86 30.54 30.23 30.38 30.85 23.53 30.72       Hyperlipidemia LDL goal <100 Hyperlipidemia:Low fat diet discussed and encouraged.   Lipid Panel  Lab Results  Component Value Date   CHOL 189 11/20/2021   HDL 64 11/20/2021   LDLCALC 108 (H) 11/20/2021   TRIG 94 11/20/2021   CHOLHDL 3.0 11/20/2021   Needs to reduce fat intake , no med change    Hypothyroidism Controlled, no change in medication   Depression Controlled, no change in medication   Legally blind Home safety and fall risk reduction reviewed

## 2021-12-02 NOTE — Assessment & Plan Note (Signed)
Controlled, no change in medication  

## 2021-12-02 NOTE — Assessment & Plan Note (Signed)
Controlled, no change in medication DASH diet and commitment to daily physical activity for a minimum of 30 minutes discussed and encouraged, as a part of hypertension management. The importance of attaining a healthy weight is also discussed.  BP/Weight 11/20/2021 10/31/2021 09/12/2021 09/11/2021 08/20/2021 1/50/5697 08/13/8015  Systolic BP 553 748 270 786 - 754 492  Diastolic BP 83 82 75 71 - 92 76  Wt. (Lbs) 197.04 195 193 194 197 148 196.12  BMI 30.86 30.54 30.23 30.38 30.85 23.53 30.72

## 2021-12-02 NOTE — Assessment & Plan Note (Signed)
Hyperlipidemia:Low fat diet discussed and encouraged.   Lipid Panel  Lab Results  Component Value Date   CHOL 189 11/20/2021   HDL 64 11/20/2021   LDLCALC 108 (H) 11/20/2021   TRIG 94 11/20/2021   CHOLHDL 3.0 11/20/2021   Needs to reduce fat intake , no med change

## 2021-12-18 ENCOUNTER — Other Ambulatory Visit: Payer: Self-pay | Admitting: Family Medicine

## 2022-01-14 ENCOUNTER — Other Ambulatory Visit: Payer: Self-pay | Admitting: "Endocrinology

## 2022-01-16 ENCOUNTER — Other Ambulatory Visit: Payer: Self-pay

## 2022-01-16 ENCOUNTER — Other Ambulatory Visit: Payer: Self-pay | Admitting: Family Medicine

## 2022-02-14 ENCOUNTER — Other Ambulatory Visit: Payer: Self-pay | Admitting: Family Medicine

## 2022-02-15 ENCOUNTER — Other Ambulatory Visit: Payer: Self-pay

## 2022-02-15 MED ORDER — ALBUTEROL SULFATE HFA 108 (90 BASE) MCG/ACT IN AERS: INHALATION_SPRAY | RESPIRATORY_TRACT | 0 refills | Status: DC

## 2022-03-05 ENCOUNTER — Other Ambulatory Visit: Payer: Self-pay | Admitting: Family Medicine

## 2022-03-05 ENCOUNTER — Other Ambulatory Visit: Payer: Self-pay | Admitting: Internal Medicine

## 2022-03-05 DIAGNOSIS — J309 Allergic rhinitis, unspecified: Secondary | ICD-10-CM

## 2022-03-18 ENCOUNTER — Other Ambulatory Visit: Payer: Self-pay | Admitting: Family Medicine

## 2022-04-11 LAB — TSH: TSH: 4.16 u[IU]/mL (ref 0.450–4.500)

## 2022-04-11 LAB — T4, FREE: Free T4: 1.26 ng/dL (ref 0.82–1.77)

## 2022-04-16 ENCOUNTER — Other Ambulatory Visit: Payer: Self-pay | Admitting: Family Medicine

## 2022-04-17 ENCOUNTER — Ambulatory Visit (INDEPENDENT_AMBULATORY_CARE_PROVIDER_SITE_OTHER): Payer: Medicare Other | Admitting: "Endocrinology

## 2022-04-17 ENCOUNTER — Encounter: Payer: Self-pay | Admitting: "Endocrinology

## 2022-04-17 VITALS — BP 130/78 | HR 76 | Ht 67.0 in | Wt 199.0 lb

## 2022-04-17 DIAGNOSIS — E782 Mixed hyperlipidemia: Secondary | ICD-10-CM | POA: Diagnosis not present

## 2022-04-17 DIAGNOSIS — E049 Nontoxic goiter, unspecified: Secondary | ICD-10-CM | POA: Diagnosis not present

## 2022-04-17 DIAGNOSIS — E039 Hypothyroidism, unspecified: Secondary | ICD-10-CM

## 2022-04-17 DIAGNOSIS — R7303 Prediabetes: Secondary | ICD-10-CM | POA: Diagnosis not present

## 2022-04-17 LAB — POCT GLYCOSYLATED HEMOGLOBIN (HGB A1C): HbA1c, POC (controlled diabetic range): 5.8 % (ref 0.0–7.0)

## 2022-04-17 NOTE — Patient Instructions (Signed)

## 2022-04-17 NOTE — Progress Notes (Signed)
?04/17/2022 ? ?Endocrinology follow-up note ? ? ?Subjective:  ? ? Patient ID: [name], female    DOB: 12-Feb-1951, PCP Fayrene Helper, MD ? ? ?Past Medical History:  ?Diagnosis Date  ? Allergy   ? Anxiety   ? Arthritis   ? Asthma   ? Hyperlipidemia   ? Hypertension   ? Vision abnormalities   ? states close to blindness due to cataracts  ? ?Past Surgical History:  ?Procedure Laterality Date  ? caesarean    ? cataracts    ? CESAREAN SECTION    ? COLONOSCOPY WITH PROPOFOL N/A 09/11/2021  ? Procedure: COLONOSCOPY WITH PROPOFOL;  Surgeon: Eloise Harman, DO;  [facility];  Service: Endoscopy;  Laterality: N/A;  10:30 / ASA II  ? POLYPECTOMY  09/11/2021  ? Procedure: POLYPECTOMY;  Surgeon: Eloise Harman, DO;  [facility]: [facility];  Service: Endoscopy;;  ? TOTAL ABDOMINAL HYSTERECTOMY    ? prolapse  ? ?Social History  ? ?Socioeconomic History  ? Marital status: Single  ?  Spouse name: Not on file  ? Number of children: Not on file  ? Years of education: 12th grade  ? Highest education level: Not on file  ?Occupational History  ? Occupation: disabled  ?  Employer: NOT EMPLOYED  ?Tobacco Use  ? Smoking status: Former  ?  Packs/day: 0.50  ?  Years: 18.00  ?  Pack years: 9.00  ?  Types: Cigarettes  ?  Quit date: 1989  ?  Years since quitting: 34.3  ? Smokeless tobacco: Never  ?Vaping Use  ? Vaping Use: Never used  ?Substance and Sexual Activity  ? Alcohol use: No  ? Drug use: No  ? Sexual activity: Not Currently  ?Other Topics Concern  ? Not on file  ?Social History Narrative  ? Not on file  ? ?Social Determinants of Health  ? ?Financial Resource Strain: Low Risk   ? Difficulty of Paying Living Expenses: Not hard at all  ?Food Insecurity: No Food Insecurity  ? Worried About Charity fundraiser in the Last Year: Never true  ? Ran Out of Food in the Last Year: Never true  ?Transportation Needs: No Transportation Needs  ? Lack of Transportation (Medical): No  ? Lack of Transportation  (Non-Medical): No  ?Physical Activity: Insufficiently Active  ? Days of Exercise per Week: 7 days  ? Minutes of Exercise per Session: 10 min  ?Stress: No Stress Concern Present  ? Feeling of Stress : Not at all  ?Social Connections: Moderately Isolated  ? Frequency of Communication with Friends and Family: More than three times a week  ? Frequency of Social Gatherings with Friends and Family: More than three times a week  ? Attends Religious Services: Never  ? Active Member of Clubs or Organizations: Yes  ? Attends Archivist Meetings: More than 4 times per year  ? Marital Status: Never married  ? ?Outpatient Encounter Medications as of 04/17/2022  ?Medication Sig  ? acetaminophen (TYLENOL) 650 MG CR tablet Take 650 mg by mouth as needed for pain.  ? albuterol (VENTOLIN HFA) 108 (90 Base) MCG/ACT inhaler INHALE 2 PUFFS INTO THE LUNGS EVERY 4 HOURS AS NEEDED FOR COUGHNG ANDWHEEZING.  ? alendronate (FOSAMAX) 70 MG tablet TAKE ONE TABLET BY MOUTH ON AN EMPTY STOMACH WITH A GLASS FULL OF WATER ONCE A WEEK  ? amLODipine (NORVASC) 10 MG tablet Take 1 tablet (10 mg total) by mouth at bedtime.  ?  betamethasone dipropionate 0.05 % cream Apply topically 2 (two) times daily. Apply twice daily to rash on left elbow for 7 days, then as needed (Patient taking differently: Apply topically as needed. Apply twice daily to rash on left elbow for 7 days, then as needed)  ? calcium-vitamin D (OSCAL-500) 500-400 MG-UNIT tablet Take 1 tablet by mouth 2 (two) times daily.  ? cetirizine (ZYRTEC) 10 MG tablet TAKE (1) TABLET BY MOUTH DAILY.  ? Cholecalciferol (VITAMIN D3) 50 MCG (2000 UT) capsule Take 2,000 Units by mouth daily.  ? fluticasone (FLONASE) 50 MCG/ACT nasal spray USE 1 SPRAY IN EACH NOSTRIL DAILY.  ? levothyroxine (SYNTHROID) 100 MCG tablet TAKE 1 TABLET BY MOUTH DAILY.  ? lovastatin (MEVACOR) 40 MG tablet TAKE (1) TABLET BY MOUTH AT BEDTIME.  ? montelukast (SINGULAIR) 10 MG tablet TAKE (1) TABLET BY MOUTH AT BEDTIME.   ? PARoxetine (PAXIL) 20 MG tablet Take 1 tablet (20 mg total) by mouth daily.  ? polyethylene glycol powder (GLYCOLAX/MIRALAX) 17 GM/SCOOP powder Take 17 g by mouth 2 (two) times daily as needed.  ? SYMBICORT 80-4.5 MCG/ACT inhaler INHALE 2 PUFFS INTO THE LUNGS TWICE DAILY.  ? triamterene-hydrochlorothiazide (MAXZIDE-25) 37.5-25 MG tablet Take half tablet by mouth once daily  ? [DISCONTINUED] meloxicam (MOBIC) 7.5 MG tablet TAKE ONE TABLET BY MOUTH ONCE DAILY.  ? ?No facility-administered encounter medications on file as of 04/17/2022.  ? ?ALLERGIES: ?Allergies  ?Allergen Reactions  ? Lotensin Hct [Benazepril-Hydrochlorothiazide] Hives  ? ? ?VACCINATION STATUS: ?Immunization History  ?Administered Date(s) Administered  ? Fluad Quad(high Dose 65+) 10/06/2019, 08/18/2020, 08/14/2021  ? H1N1 11/15/2008  ? Influenza Split 08/30/2011, 08/14/2012  ? Influenza Whole 09/15/2009, 09/26/2010  ? Influenza,inj,Quad PF,6+ Mos 10/18/2013, 08/25/2014, 12/13/2015, 08/05/2016, 09/22/2017, 07/28/2018  ? Moderna SARS-COV2 Booster Vaccination 08/25/2021  ? Moderna Sars-Covid-2 Vaccination 01/16/2020, 02/16/2020, 11/10/2020, 04/16/2021  ? Pneumococcal Conjugate-13 05/03/2015  ? Pneumococcal Polysaccharide-23 08/05/2016  ? Td 03/02/2004  ? Tdap 12/12/2011  ? Zoster Recombinat (Shingrix) 09/13/2019, 11/15/2019  ? ? ?HPI ?71 year old female patient with medical history as above.  She is returning for follow-up with repeat thyroid function test for follow-up of hypothyroidism, hyperlipidemia, prediabetes. ?She is currently on stable dose of levothyroxine 100 mcg p.o. daily before breakfast.   She reports compliance with medication.  She has no new complaints today.   ?-She has a steady weight. ?- She is a poor historian accompanied by her sister.  ?- She denies dysphagia, shortness of breath, nor voice change. She had  a series of thyroid ultrasound studies, last one from 05/02/2017 showing 1 cm solitary nodule on the left lobe of the  thyroid .  Her most recent surveillance thyroid ultrasound in 2021 reveals the same finding, unchanged. ?- Ultrasound description of the thyroid summarized below. 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 ? ?Her point-of-care A1c is 5.8%, still consistent with prediabetes.  She is on pravastatin for hyperlipidemia. ? ?Current Outpatient Medications:  ?  acetaminophen (TYLENOL) 650 MG CR tablet, Take 650 mg by mouth as needed for pain., Disp: , Rfl:  ?  albuterol (VENTOLIN HFA) 108 (90 Base) MCG/ACT inhaler, INHALE 2 PUFFS INTO THE LUNGS EVERY 4 HOURS AS NEEDED FOR COUGHNG ANDWHEEZING., Disp: 18 g, Rfl: 0 ?  alendronate (FOSAMAX) 70 MG tablet, TAKE ONE TABLET BY MOUTH ON AN EMPTY STOMACH WITH A GLASS FULL OF WATER ONCE A WEEK, Disp: 4 tablet, Rfl: 11 ?  amLODipine (NORVASC) 10 MG tablet, Take 1  tablet (10 mg total) by mouth at bedtime., Disp: 30 tablet, Rfl: 11 ?  betamethasone dipropionate 0.05 % cream, Apply topically 2 (two) times daily. Apply twice daily to rash on left elbow for 7 days, then as needed (Patient taking differently: Apply topically as needed. Apply twice daily to rash on left elbow for 7 days, then as needed), Disp: 45 g, Rfl: 0 ?  calcium-vitamin D (OSCAL-500) 500-400 MG-UNIT tablet, Take 1 tablet by mouth 2 (two) times daily., Disp: 60 tablet, Rfl: 11 ?  cetirizine (ZYRTEC) 10 MG tablet, TAKE (1) TABLET BY MOUTH DAILY., Disp: 30 tablet, Rfl: 11 ?  Cholecalciferol (VITAMIN D3) 50 MCG (2000 UT) capsule, Take 2,000 Units by mouth daily., Disp: , Rfl:  ?  fluticasone (FLONASE) 50 MCG/ACT nasal spray, USE 1 SPRAY IN EACH NOSTRIL DAILY., Disp: 16 g, Rfl: 0 ?  levothyroxine (SYNTHROID) 100 MCG tablet, TAKE 1 TABLET BY MOUTH DAILY., Disp: 90 tablet, Rfl: 0 ?  lovastatin (MEVACOR) 40 MG tablet, TAKE (1) TABLET BY MOUTH AT BEDTIME., Disp: 30 tablet, Rfl: 11 ?  montelukast (SINGULAIR) 10 MG tablet, TAKE (1) TABLET BY MOUTH AT BEDTIME., Disp: 30  tablet, Rfl: 11 ?  PARoxetine (PAXIL) 20 MG tablet, Take 1 tablet (20 mg total) by mouth daily., Disp: 30 tablet, Rfl: 11 ?  polyethylene glycol powder (GLYCOLAX/MIRALAX) 17 GM/SCOOP powder, Take 17 g by mouth 2 (two) t

## 2022-04-23 ENCOUNTER — Encounter: Payer: Self-pay | Admitting: Family Medicine

## 2022-04-23 ENCOUNTER — Ambulatory Visit (INDEPENDENT_AMBULATORY_CARE_PROVIDER_SITE_OTHER): Payer: Medicare Other | Admitting: Family Medicine

## 2022-04-23 VITALS — BP 144/84 | HR 75 | Ht 67.0 in | Wt 197.1 lb

## 2022-04-23 DIAGNOSIS — B369 Superficial mycosis, unspecified: Secondary | ICD-10-CM | POA: Diagnosis not present

## 2022-04-23 DIAGNOSIS — E669 Obesity, unspecified: Secondary | ICD-10-CM

## 2022-04-23 DIAGNOSIS — R7303 Prediabetes: Secondary | ICD-10-CM

## 2022-04-23 DIAGNOSIS — I10 Essential (primary) hypertension: Secondary | ICD-10-CM

## 2022-04-23 DIAGNOSIS — Z1231 Encounter for screening mammogram for malignant neoplasm of breast: Secondary | ICD-10-CM | POA: Diagnosis not present

## 2022-04-23 DIAGNOSIS — F411 Generalized anxiety disorder: Secondary | ICD-10-CM

## 2022-04-23 DIAGNOSIS — M5410 Radiculopathy, site unspecified: Secondary | ICD-10-CM | POA: Diagnosis not present

## 2022-04-23 DIAGNOSIS — F324 Major depressive disorder, single episode, in partial remission: Secondary | ICD-10-CM

## 2022-04-23 DIAGNOSIS — E782 Mixed hyperlipidemia: Secondary | ICD-10-CM

## 2022-04-23 MED ORDER — CLOTRIMAZOLE-BETAMETHASONE 1-0.05 % EX CREA: TOPICAL_CREAM | CUTANEOUS | 1 refills | Status: AC

## 2022-04-23 MED ORDER — PREDNISONE 10 MG PO TABS
10.0000 mg | ORAL_TABLET | Freq: Two times a day (BID) | ORAL | 0 refills | Status: DC
Start: 2022-04-23 — End: 2022-10-01

## 2022-04-23 MED ORDER — KETOROLAC TROMETHAMINE 60 MG/2ML IM SOLN
30.0000 mg | Freq: Once | INTRAMUSCULAR | Status: AC
  Administered 2022-04-23: 30 mg via INTRAMUSCULAR

## 2022-04-23 MED ORDER — METHYLPREDNISOLONE ACETATE 80 MG/ML IJ SUSP
40.0000 mg | Freq: Once | INTRAMUSCULAR | Status: AC
  Administered 2022-04-23: 40 mg via INTRAMUSCULAR

## 2022-04-23 NOTE — Assessment & Plan Note (Signed)
Controlled, no change in medication  

## 2022-04-23 NOTE — Assessment & Plan Note (Signed)
?  Patient re-educated about  the importance of commitment to a  minimum of 150 minutes of exercise per week as able. ? ?The importance of healthy food choices with portion control discussed, as well as eating regularly and within a 12 hour window most days. ?The need to choose "clean , green" food 50 to 75% of the time is discussed, as well as to make water the primary drink and set a goal of 64 ounces water daily. ? ?  ? ?  04/23/2022  ? 10:10 AM 04/17/2022  ?  8:54 AM 11/20/2021  ? 10:29 AM  ?Weight /BMI  ?Weight 197 lb 1.9 oz 199 lb 197 lb 0.6 oz  ?Height '5\' 7"'$  (1.702 m) '5\' 7"'$  (1.702 m) '5\' 7"'$  (1.702 m)  ?BMI 30.87 kg/m2 31.17 kg/m2 30.86 kg/m2  ? ? ? ?

## 2022-04-23 NOTE — Assessment & Plan Note (Signed)
Patient educated about the importance of limiting  Carbohydrate intake , the need to commit to daily physical activity for a minimum of 30 minutes , and to commit weight loss. ?The fact that changes in all these areas will reduce or eliminate all together the development of diabetes is stressed.  ? ? ?  Latest Ref Rng & Units 04/17/2022  ?  9:04 AM 11/20/2021  ? 11:50 AM 09/07/2021  ?  9:18 AM 04/17/2021  ?  8:56 AM 04/16/2021  ? 10:58 AM  ?Diabetic Labs  ?HbA1c 0.0 - 7.0 % 5.8     5.7     ?Chol 100 - 199 mg/dL  189     186    ?HDL >39 mg/dL  64     74    ?Calc LDL 0 - 99 mg/dL  108     95    ?Triglycerides 0 - 149 mg/dL  94     93    ?Creatinine 0.57 - 1.00 mg/dL  0.77   0.85    0.79    ? ? ?  04/23/2022  ? 10:35 AM 04/23/2022  ? 10:10 AM 04/17/2022  ?  8:54 AM 11/20/2021  ? 10:29 AM 10/31/2021  ? 10:13 AM 09/12/2021  ?  9:36 AM 09/11/2021  ? 11:34 AM  ?BP/Weight  ?Systolic BP 889 169 450 388 144 132 114  ?Diastolic BP 84 85 78 83 82 75 71  ?Wt. (Lbs)  197.12 199 197.04 195 193   ?BMI  30.87 kg/m2 31.17 kg/m2 30.86 kg/m2 30.54 kg/m2 30.23 kg/m2   ? ?   ? View : No data to display.  ?  ?  ?  ? ? ? ?

## 2022-04-23 NOTE — Patient Instructions (Addendum)
F/U in 6 months, call if you need me sooner ? ?Please schedule mammogram at checkout ? ?Nurse pt reports that she is still receiving pill packed a tablet for arthritis which was discontinued, please verify if this is so with the pharmacy, and discontinue, the only med she takes daily for arthritis is tylenol ? ?Toradol 30 mg iM and depo Medrol 40 mg IM in office today for sciatic pain and 5 day course of prednisone is also prescribed ? ? ? ?Please cut back on icecream, cookies and cake, so cholesterol will decrease ? ?Thanks for choosing [facility], we consider it a privelige to serve you. ? ?

## 2022-04-23 NOTE — Assessment & Plan Note (Signed)
Uncontrolled.Toradol and depo medrol administered IM in the office , to be followed by a short course of oral prednisone   

## 2022-04-23 NOTE — Progress Notes (Signed)
? ?[name]     MRN: [id number]      DOB: [birth year] ? ? ?HPI ?[name] is here for follow up and re-evaluation of chronic medical conditions, medication management and review of any available recent lab and radiology data.  ?Preventive health is updated, specifically  Cancer screening and Immunization.   ?Questions or concerns regarding consultations or procedures which the PT has had in the interim are  addressed. ?The PT denies any adverse reactions to current medications since the last visit.  ?3 week h/o increased   ? ?ROS ?Denies recent fever or chills. ?Denies sinus pressure, nasal congestion, ear pain or sore throat. ?Denies chest congestion, productive cough or wheezing. ?Denies chest pains, palpitations and leg swelling ?Denies abdominal pain, nausea, vomiting,diarrhea or constipation.   ?Denies dysuria, frequency, hesitancy or incontinence. ? ?Denies headaches, seizures, . ?Denies  uncontrolled depression, anxiety or insomnia. ?Denies skin break down or rash. ? ? ?PE ? ?BP (!) 144/84   Pulse 75   Ht '5\' 7"'$  (1.702 m)   Wt 197 lb 1.9 oz (89.4 kg)   SpO2 95%   BMI 30.87 kg/m?  ? ?Patient alert and oriented and in no cardiopulmonary distress. ? ?HEENT: No facial asymmetry, EOMI,     Neck supple . ? ?Chest: Clear to auscultation bilaterally. ? ?CVS: S1, S2 no murmurs, no S3.Regular rate. ? ?ABD: Soft non tender.  ? ?Ext: No edema ? ?MS: Adequate ROM spine, shoulders, hips and knees. ? ?Skin: Intact, no ulcerations or rash noted. ? ?Psych: Good eye contact, normal affect. Memory intact not anxious or depressed appearing. ? ?CNS: CN 2-12 intact, power,  normal throughout.no focal deficits noted. ? ? ?Assessment & Plan ? ?Essential hypertension ?UnControlled, no change in medication, has not taken one of her meds this am due to diuretic s/e ?DASH diet and commitment to daily physical activity for a minimum of 30 minutes discussed and encouraged, as a part of hypertension management. ?The  importance of attaining a healthy weight is also discussed. ? ? ?  04/23/2022  ? 10:35 AM 04/23/2022  ? 10:10 AM 04/17/2022  ?  8:54 AM 11/20/2021  ? 10:29 AM 10/31/2021  ? 10:13 AM 09/12/2021  ?  9:36 AM 09/11/2021  ? 11:34 AM  ?BP/Weight  ?Systolic BP 710 626 948 546 144 132 114  ?Diastolic BP 84 85 78 83 82 75 71  ?Wt. (Lbs)  197.12 199 197.04 195 193   ?BMI  30.87 kg/m2 31.17 kg/m2 30.86 kg/m2 30.54 kg/m2 30.23 kg/m2   ? ? ? ? ? ?Back pain with radiculopathy ?Uncontrolled.Toradol and depo medrol administered IM in the office , to be followed by a short course of oral prednisone . ? ? ?Dermatomycosis ?Left upper back, max diameter apprx 5.5 cm, clotrimazole/betameth prescribed ? ?GAD (generalized anxiety disorder) ?Controlled, no change in medication ? ? ?Depression ?Controlled, no change in medication ? ? ?Obesity (BMI 30.0-34.9) ? ?Patient re-educated about  the importance of commitment to a  minimum of 150 minutes of exercise per week as able. ? ?The importance of healthy food choices with portion control discussed, as well as eating regularly and within a 12 hour window most days. ?The need to choose "clean , green" food 50 to 75% of the time is discussed, as well as to make water the primary drink and set a goal of 64 ounces water daily. ? ?  ? ?  04/23/2022  ? 10:10 AM 04/17/2022  ?  8:54 AM 11/20/2021  ?  10:29 AM  ?Weight /BMI  ?Weight 197 lb 1.9 oz 199 lb 197 lb 0.6 oz  ?Height '5\' 7"'$  (1.702 m) '5\' 7"'$  (1.702 m) '5\' 7"'$  (1.702 m)  ?BMI 30.87 kg/m2 31.17 kg/m2 30.86 kg/m2  ? ? ? ? ?Mixed hyperlipidemia ?Hyperlipidemia:Low fat diet discussed and encouraged. ? ? ?Lipid Panel  ?Lab Results  ?Component Value Date  ? CHOL 189 11/20/2021  ? HDL 64 11/20/2021  ? LDLCALC 108 (H) 11/20/2021  ? TRIG 94 11/20/2021  ? CHOLHDL 3.0 11/20/2021  ? ?Needs to reduce fat in diet ?Updated lab needed at/ before next visit. ? ? ? ?Prediabetes ?Patient educated about the importance of limiting  Carbohydrate intake , the need to commit to  daily physical activity for a minimum of 30 minutes , and to commit weight loss. ?The fact that changes in all these areas will reduce or eliminate all together the development of diabetes is stressed.  ? ? ?  Latest Ref Rng & Units 04/17/2022  ?  9:04 AM 11/20/2021  ? 11:50 AM 09/07/2021  ?  9:18 AM 04/17/2021  ?  8:56 AM 04/16/2021  ? 10:58 AM  ?Diabetic Labs  ?HbA1c 0.0 - 7.0 % 5.8     5.7     ?Chol 100 - 199 mg/dL  189     186    ?HDL >39 mg/dL  64     74    ?Calc LDL 0 - 99 mg/dL  108     95    ?Triglycerides 0 - 149 mg/dL  94     93    ?Creatinine 0.57 - 1.00 mg/dL  0.77   0.85    0.79    ? ? ?  04/23/2022  ? 10:35 AM 04/23/2022  ? 10:10 AM 04/17/2022  ?  8:54 AM 11/20/2021  ? 10:29 AM 10/31/2021  ? 10:13 AM 09/12/2021  ?  9:36 AM 09/11/2021  ? 11:34 AM  ?BP/Weight  ?Systolic BP 169 450 388 828 144 132 114  ?Diastolic BP 84 85 78 83 82 75 71  ?Wt. (Lbs)  197.12 199 197.04 195 193   ?BMI  30.87 kg/m2 31.17 kg/m2 30.86 kg/m2 30.54 kg/m2 30.23 kg/m2   ? ?   ? View : No data to display.  ?  ?  ?  ? ? ? ? ? ?

## 2022-04-23 NOTE — Assessment & Plan Note (Addendum)
UnControlled, no change in medication, has not taken one of her meds this am due to diuretic s/e ?DASH diet and commitment to daily physical activity for a minimum of 30 minutes discussed and encouraged, as a part of hypertension management. ?The importance of attaining a healthy weight is also discussed. ? ? ?  04/23/2022  ? 10:35 AM 04/23/2022  ? 10:10 AM 04/17/2022  ?  8:54 AM 11/20/2021  ? 10:29 AM 10/31/2021  ? 10:13 AM 09/12/2021  ?  9:36 AM 09/11/2021  ? 11:34 AM  ?BP/Weight  ?Systolic BP 037 048 889 169 144 132 114  ?Diastolic BP 84 85 78 83 82 75 71  ?Wt. (Lbs)  197.12 199 197.04 195 193   ?BMI  30.87 kg/m2 31.17 kg/m2 30.86 kg/m2 30.54 kg/m2 30.23 kg/m2   ? ? ? ? ?

## 2022-04-23 NOTE — Assessment & Plan Note (Signed)
Left upper back, max diameter apprx 5.5 cm, clotrimazole/betameth prescribed ?

## 2022-04-23 NOTE — Assessment & Plan Note (Signed)
Hyperlipidemia:Low fat diet discussed and encouraged. ? ? ?Lipid Panel  ?Lab Results  ?Component Value Date  ? CHOL 189 11/20/2021  ? HDL 64 11/20/2021  ? LDLCALC 108 (H) 11/20/2021  ? TRIG 94 11/20/2021  ? CHOLHDL 3.0 11/20/2021  ? ?Needs to reduce fat in diet ?Updated lab needed at/ before next visit. ? ? ?

## 2022-05-06 ENCOUNTER — Other Ambulatory Visit: Payer: Self-pay | Admitting: Internal Medicine

## 2022-05-06 ENCOUNTER — Other Ambulatory Visit: Payer: Self-pay | Admitting: Family Medicine

## 2022-05-06 DIAGNOSIS — J309 Allergic rhinitis, unspecified: Secondary | ICD-10-CM

## 2022-05-08 ENCOUNTER — Other Ambulatory Visit: Payer: Self-pay | Admitting: Family Medicine

## 2022-05-08 ENCOUNTER — Encounter: Payer: Self-pay | Admitting: Family Medicine

## 2022-05-08 ENCOUNTER — Telehealth: Payer: Self-pay | Admitting: Family Medicine

## 2022-05-08 MED ORDER — MOMETASONE FURO-FORMOTEROL FUM 100-5 MCG/ACT IN AERO: 2.0000 | INHALATION_SPRAY | Freq: Two times a day (BID) | RESPIRATORY_TRACT | 5 refills | Status: DC

## 2022-05-08 NOTE — Telephone Encounter (Signed)
Patient aware.

## 2022-05-08 NOTE — Telephone Encounter (Signed)
{  Pls let her know I have to change the symbicort because of ins coverage, new is dulera

## 2022-05-16 ENCOUNTER — Other Ambulatory Visit: Payer: Self-pay | Admitting: "Endocrinology

## 2022-06-17 ENCOUNTER — Ambulatory Visit: Payer: Medicare Other

## 2022-07-03 ENCOUNTER — Ambulatory Visit (INDEPENDENT_AMBULATORY_CARE_PROVIDER_SITE_OTHER): Payer: Medicare Other

## 2022-07-03 DIAGNOSIS — Z0000 Encounter for general adult medical examination without abnormal findings: Secondary | ICD-10-CM

## 2022-07-03 NOTE — Patient Instructions (Signed)
[name] , Thank you for taking time to come for your Medicare Wellness Visit. I appreciate your ongoing commitment to your health goals. Please review the following plan we discussed and let me know if I can assist you in the future.   Screening recommendations/referrals: Colonoscopy: Complete Mammogram: Complete Bone Density: Complete Recommended yearly ophthalmology/optometry visit for glaucoma screening and checkup Recommended yearly dental visit for hygiene and checkup  Vaccinations: Influenza vaccine: Complete Pneumococcal vaccine: Complete Tdap vaccine: Due now Shingles vaccine: Complete    Advanced directives: Declined  Conditions/risks identified: Hypertension   Next appointment: 1 Year   Preventive Care 42 Years and Older, Female Preventive care refers to lifestyle choices and visits with your health care provider that can promote health and wellness. What does preventive care include? A yearly physical exam. This is also called an annual well check. Dental exams once or twice a year. Routine eye exams. Ask your health care provider how often you should have your eyes checked. Personal lifestyle choices, including: Daily care of your teeth and gums. Regular physical activity. Eating a healthy diet. Avoiding tobacco and drug use. Limiting alcohol use. Practicing safe sex. Taking low-dose aspirin every day. Taking vitamin and mineral supplements as recommended by your health care provider. What happens during an annual well check? The services and screenings done by your health care provider during your annual well check will depend on your age, overall health, lifestyle risk factors, and family history of disease. Counseling  Your health care provider may ask you questions about your: Alcohol use. Tobacco use. Drug use. Emotional well-being. Home and relationship well-being. Sexual activity. Eating habits. History of falls. Memory and ability to  understand (cognition). Work and work Statistician. Reproductive health. Screening  You may have the following tests or measurements: Height, weight, and BMI. Blood pressure. Lipid and cholesterol levels. These may be checked every 5 years, or more frequently if you are over 41 years old. Skin check. Lung cancer screening. You may have this screening every year starting at age 89 if you have a 30-pack-year history of smoking and currently smoke or have quit within the past 15 years. Fecal occult blood test (FOBT) of the stool. You may have this test every year starting at age 98. Flexible sigmoidoscopy or colonoscopy. You may have a sigmoidoscopy every 5 years or a colonoscopy every 10 years starting at age 68. Hepatitis C blood test. Hepatitis B blood test. Sexually transmitted disease (STD) testing. Diabetes screening. This is done by checking your blood sugar (glucose) after you have not eaten for a while (fasting). You may have this done every 1-3 years. Bone density scan. This is done to screen for osteoporosis. You may have this done starting at age 3. Mammogram. This may be done every 1-2 years. Talk to your health care provider about how often you should have regular mammograms. Talk with your health care provider about your test results, treatment options, and if necessary, the need for more tests. Vaccines  Your health care provider may recommend certain vaccines, such as: Influenza vaccine. This is recommended every year. Tetanus, diphtheria, and acellular pertussis (Tdap, Td) vaccine. You may need a Td booster every 10 years. Zoster vaccine. You may need this after age 78. Pneumococcal 13-valent conjugate (PCV13) vaccine. One dose is recommended after age 38. Pneumococcal polysaccharide (PPSV23) vaccine. One dose is recommended after age 26. Talk to your health care provider about which screenings and vaccines you need and how often you need them. This  information is not  intended to replace advice given to you by your health care provider. Make sure you discuss any questions you have with your health care provider. Document Released: 12/22/2015 Document Revised: 08/14/2016 Document Reviewed: 09/26/2015 Elsevier Interactive Patient Education  2017 [place] Prevention in the Home Falls can cause injuries. They can happen to people of all ages. There are many things you can do to make your home safe and to help prevent falls. What can I do on the outside of my home? Regularly fix the edges of walkways and driveways and fix any cracks. Remove anything that might make you trip as you walk through a door, such as a raised step or threshold. Trim any bushes or trees on the path to your home. Use bright outdoor lighting. Clear any walking paths of anything that might make someone trip, such as rocks or tools. Regularly check to see if handrails are loose or broken. Make sure that both sides of any steps have handrails. Any raised decks and porches should have guardrails on the edges. Have any leaves, snow, or ice cleared regularly. Use sand or salt on walking paths during winter. Clean up any spills in your garage right away. This includes oil or grease spills. What can I do in the bathroom? Use night lights. Install grab bars by the toilet and in the tub and shower. Do not use towel bars as grab bars. Use non-skid mats or decals in the tub or shower. If you need to sit down in the shower, use a plastic, non-slip stool. Keep the floor dry. Clean up any water that spills on the floor as soon as it happens. Remove soap buildup in the tub or shower regularly. Attach bath mats securely with double-sided non-slip rug tape. Do not have throw rugs and other things on the floor that can make you trip. What can I do in the bedroom? Use night lights. Make sure that you have a light by your bed that is easy to reach. Do not use any sheets or blankets that are  too big for your bed. They should not hang down onto the floor. Have a firm chair that has side arms. You can use this for support while you get dressed. Do not have throw rugs and other things on the floor that can make you trip. What can I do in the kitchen? Clean up any spills right away. Avoid walking on wet floors. Keep items that you use a lot in easy-to-reach places. If you need to reach something above you, use a strong step stool that has a grab bar. Keep electrical cords out of the way. Do not use floor polish or wax that makes floors slippery. If you must use wax, use non-skid floor wax. Do not have throw rugs and other things on the floor that can make you trip. What can I do with my stairs? Do not leave any items on the stairs. Make sure that there are handrails on both sides of the stairs and use them. Fix handrails that are broken or loose. Make sure that handrails are as long as the stairways. Check any carpeting to make sure that it is firmly attached to the stairs. Fix any carpet that is loose or worn. Avoid having throw rugs at the top or bottom of the stairs. If you do have throw rugs, attach them to the floor with carpet tape. Make sure that you have a light switch at the top  of the stairs and the bottom of the stairs. If you do not have them, ask someone to add them for you. What else can I do to help prevent falls? Wear shoes that: Do not have high heels. Have rubber bottoms. Are comfortable and fit you well. Are closed at the toe. Do not wear sandals. If you use a stepladder: Make sure that it is fully opened. Do not climb a closed stepladder. Make sure that both sides of the stepladder are locked into place. Ask someone to hold it for you, if possible. Clearly mark and make sure that you can see: Any grab bars or handrails. First and last steps. Where the edge of each step is. Use tools that help you move around (mobility aids) if they are needed. These  include: Canes. Walkers. Scooters. Crutches. Turn on the lights when you go into a dark area. Replace any light bulbs as soon as they burn out. Set up your furniture so you have a clear path. Avoid moving your furniture around. If any of your floors are uneven, fix them. If there are any pets around you, be aware of where they are. Review your medicines with your doctor. Some medicines can make you feel dizzy. This can increase your chance of falling. Ask your doctor what other things that you can do to help prevent falls. This information is not intended to replace advice given to you by your health care provider. Make sure you discuss any questions you have with your health care provider. Document Released: 09/21/2009 Document Revised: 05/02/2016 Document Reviewed: 12/30/2014 Elsevier Interactive Patient Education  2017 [employer].

## 2022-07-03 NOTE — Progress Notes (Signed)
Subjective:   [name] is a 71 y.o. female who presents for Medicare Annual (Subsequent) preventive examination.  Review of Systems    I connected with  [name] on 07/03/22 by a audio enabled telemedicine application and verified that I am speaking with the correct person using two identifiers.  Patient Location: Home  Provider Location: Office/Clinic  I discussed the limitations of evaluation and management by telemedicine. The patient expressed understanding and agreed to proceed.  Cardiac Risk Factors include: none     Objective:    There were no vitals filed for this visit. There is no height or weight on file to calculate BMI.     07/03/2022    1:31 PM 09/11/2021    9:02 AM 06/13/2021    1:19 PM 04/20/2018    1:15 PM 12/30/2016    9:57 AM 03/26/2015    2:31 PM  Advanced Directives  Does Patient Have a Medical Advance Directive? No No No No No No  Would patient like information on creating a medical advance directive? No - Patient declined No - Patient declined No - Patient declined Yes (ED - Information included in AVS) Yes (MAU/Ambulatory/Procedural Areas - Information given) No - patient declined information    Current Medications (verified) Outpatient Encounter Medications as of 07/03/2022  Medication Sig   acetaminophen (TYLENOL) 650 MG CR tablet Take 650 mg by mouth as needed for pain.   albuterol (VENTOLIN HFA) 108 (90 Base) MCG/ACT inhaler INHALE 2 PUFFS INTO THE LUNGS EVERY 4 HOURS AS NEEDED FOR COUGHNG ANDWHEEZING.   alendronate (FOSAMAX) 70 MG tablet TAKE ONE TABLET BY MOUTH ON AN EMPTY STOMACH WITH A GLASS FULL OF WATER ONCE A WEEK   amLODipine (NORVASC) 10 MG tablet Take 1 tablet (10 mg total) by mouth at bedtime.   betamethasone dipropionate 0.05 % cream Apply topically 2 (two) times daily. Apply twice daily to rash on left elbow for 7 days, then as needed (Patient taking differently: Apply topically as needed. Apply twice daily to rash on left  elbow for 7 days, then as needed)   calcium-vitamin D (OSCAL-500) 500-400 MG-UNIT tablet Take 1 tablet by mouth 2 (two) times daily.   cetirizine (ZYRTEC) 10 MG tablet TAKE (1) TABLET BY MOUTH DAILY.   Cholecalciferol (VITAMIN D3) 50 MCG (2000 UT) capsule Take 2,000 Units by mouth daily.   clotrimazole-betamethasone (LOTRISONE) cream Apply twice daily to rash on upper back for 10 days, then as needed   fluticasone (FLONASE) 50 MCG/ACT nasal spray PLACE 1 SPRAY INTO EACH NOSTRIL DAILY.   levothyroxine (SYNTHROID) 100 MCG tablet TAKE 1 TABLET BY MOUTH DAILY.   lovastatin (MEVACOR) 40 MG tablet TAKE (1) TABLET BY MOUTH AT BEDTIME.   mometasone-formoterol (DULERA) 100-5 MCG/ACT AERO Inhale 2 puffs into the lungs 2 (two) times daily.   montelukast (SINGULAIR) 10 MG tablet TAKE (1) TABLET BY MOUTH AT BEDTIME.   PARoxetine (PAXIL) 20 MG tablet Take 1 tablet (20 mg total) by mouth daily.   polyethylene glycol powder (GLYCOLAX/MIRALAX) 17 GM/SCOOP powder Take 17 g by mouth 2 (two) times daily as needed.   predniSONE (DELTASONE) 10 MG tablet Take 1 tablet (10 mg total) by mouth 2 (two) times daily with a meal.   triamterene-hydrochlorothiazide (MAXZIDE-25) 37.5-25 MG tablet Take half tablet by mouth once daily   No facility-administered encounter medications on file as of 07/03/2022.    Allergies (verified) Lotensin hct [benazepril-hydrochlorothiazide]   History: Past Medical History:  Diagnosis Date  Allergy    Anxiety    Arthritis    Asthma    Hyperlipidemia    Hypertension    Vision abnormalities    states close to blindness due to cataracts   Past Surgical History:  Procedure Laterality Date   caesarean     cataracts     CESAREAN SECTION     COLONOSCOPY WITH PROPOFOL N/A 09/11/2021   Procedure: COLONOSCOPY WITH PROPOFOL;  Surgeon: Eloise Harman, DO;  Location: [facility];  Service: Endoscopy;  Laterality: N/A;  10:30 / ASA II   POLYPECTOMY  09/11/2021   Procedure: POLYPECTOMY;   Surgeon: Eloise Harman, DO;  Location: [facility];  Service: Endoscopy;;   TOTAL ABDOMINAL HYSTERECTOMY     prolapse   Family History  Problem Relation Age of Onset   Cancer Mother 43       stomach   Kidney disease Mother    Hypertension Mother    Diabetes Father    Cancer Father        lung    Diabetes Sister    Alcohol abuse Brother    Hyperlipidemia Sister    Diabetes Sister    Diabetes Sister    Diabetes Brother    Diabetes Brother    Diabetes Brother    Diabetes Brother    Cancer Daughter 89       cervix   Arthritis Other    Asthma Other    Social History   Socioeconomic History   Marital status: Single    Spouse name: Not on file   Number of children: Not on file   Years of education: 12th grade   Highest education level: Not on file  Occupational History   Occupation: disabled    Employer: NOT EMPLOYED  Tobacco Use   Smoking status: Former    Packs/day: 0.50    Years: 18.00    Total pack years: 9.00    Types: Cigarettes    Quit date: 1989    Years since quitting: 34.5   Smokeless tobacco: Never  Vaping Use   Vaping Use: Never used  Substance and Sexual Activity   Alcohol use: No   Drug use: No   Sexual activity: Not Currently  Other Topics Concern   Not on file  Social History Narrative   Not on file   Social Determinants of Health   Financial Resource Strain: Low Risk  (07/03/2022)   Overall Financial Resource Strain (CARDIA)    Difficulty of Paying Living Expenses: Not hard at all  Food Insecurity: No Food Insecurity (07/03/2022)   Hunger Vital Sign    Worried About Running Out of Food in the Last Year: Never true    [place] in the Last Year: Never true  Transportation Needs: No Transportation Needs (07/03/2022)   PRAPARE - Hydrologist (Medical): No    Lack of Transportation (Non-Medical): No  Physical Activity: Insufficiently Active (07/03/2022)   Exercise Vital Sign    Days of Exercise per  Week: 7 days    Minutes of Exercise per Session: 20 min  Stress: No Stress Concern Present (07/03/2022)   [place]    Feeling of Stress : Not at all  Social Connections: Moderately Isolated (07/03/2022)   Social Connection and Isolation Panel [NHANES]    Frequency of Communication with Friends and Family: More than three times a week    Frequency of Social  Gatherings with Friends and Family: More than three times a week    Attends Religious Services: More than 4 times per year    Active Member of Clubs or Organizations: No    Attends Archivist Meetings: Never    Marital Status: Separated    Tobacco Counseling Counseling given: Not Answered   Clinical Intake:  Ms. Catapano , Thank you for taking time to come for your Medicare Wellness Visit. I appreciate your ongoing commitment to your health goals. Please review the following plan we discussed and let me know if I can assist you in the future.   These are the goals we discussed:  Goals      DIET - INCREASE WATER INTAKE     Exercise 3x per week (30 min per time)     Patient Stated     None at this time.     Reduce sugar intake     Starting 12/31/2016 patient would like to decrease the amount of sweets she eats in a day. When craving sweets she will try to to replace it with a piece of fruit.        This is a list of the screening recommended for you and due dates:  Health Maintenance  Topic Date Due   COVID-19 Vaccine (5 - Moderna series) 10/20/2021   Tetanus Vaccine  12/11/2021   Flu Shot  07/09/2022   Mammogram  07/20/2023   Colon Cancer Screening  09/12/2031   Pneumonia Vaccine  Completed   DEXA scan (bone density measurement)  Completed   Hepatitis C Screening: USPSTF Recommendation to screen - Ages 58-79 yo.  Completed   Zoster (Shingles) Vaccine  Completed   HPV Vaccine  Aged Out    Pre-visit preparation completed: Yes  Pain :  No/denies pain     BMI - recorded: 30.87 Nutritional Status: BMI > 30  Obese Nutritional Risks: None Diabetes: No  How often do you need to have someone help you when you read instructions, pamphlets, or other written materials from your doctor or pharmacy?: 1 - Never What is the last grade level you completed in school?: 12  Diabetic?NO  Interpreter Needed?: No      Activities of Daily Living    07/03/2022    1:32 PM  In your present state of health, do you have any difficulty performing the following activities:  Hearing? 1  Vision? 1  Difficulty concentrating or making decisions? 1  Walking or climbing stairs? 1  Dressing or bathing? 0  Doing errands, shopping? 1  Preparing Food and eating ? N  Using the Toilet? N  In the past six months, have you accidently leaked urine? N  Do you have problems with loss of bowel control? N  Managing your Medications? N  Managing your Finances? N  Housekeeping or managing your Housekeeping? N    Patient Care Team: Fayrene Helper, MD as PCP - General (Family Medicine) Leta Baptist, MD as Consulting Physician (Otolaryngology) Carole Civil, MD as Consulting Physician (Orthopedic Surgery) Eloise Harman, DO as Consulting Physician (Gastroenterology)  Indicate any recent Medical Services you may have received from other than [facility] providers in the past year (date may be approximate).     Assessment:   This is a routine wellness examination for Evadene.  Hearing/Vision screen No results found.  Dietary issues and exercise activities discussed: Current Exercise Habits: Home exercise routine, Time (Minutes): 20, Frequency (Times/Week): 7, Weekly Exercise (Minutes/Week): 140, Intensity: Mild, Exercise  limited by: None identified   Goals Addressed             This Visit's Progress    Patient Stated       None at this time.      Depression Screen    07/03/2022    1:32 PM 07/03/2022    1:29 PM 04/23/2022   10:10  AM 11/20/2021   10:26 AM 09/12/2021    9:37 AM 08/01/2021    9:53 AM 07/10/2021   10:12 AM  PHQ 2/9 Scores  PHQ - 2 Score '1 1 1 1 '$ 0 0 1  PHQ- 9 Score    2       Fall Risk    07/03/2022    1:31 PM 04/23/2022   10:10 AM 11/20/2021   10:26 AM 09/12/2021    9:37 AM 08/01/2021    9:53 AM  Fall Risk   Falls in the past year? 0 0 0 0 0  Number falls in past yr: 0 0 0 0 0  Injury with Fall? 0 0 0 0 0  Risk for fall due to : No Fall Risks No Fall Risks   No Fall Risks  Follow up Falls evaluation completed Falls evaluation completed   Falls evaluation completed    FALL RISK PREVENTION PERTAINING TO THE HOME:  Any stairs in or around the home? No  If so, are there any without handrails?  N/a Home free of loose throw rugs in walkways, pet beds, electrical cords, etc? Yes  Adequate lighting in your home to reduce risk of falls? Yes   ASSISTIVE DEVICES UTILIZED TO PREVENT FALLS:  Life alert? No  Use of a cane, walker or w/c? Yes  Grab bars in the bathroom? Yes  Shower chair or bench in shower? No  Elevated toilet seat or a handicapped toilet? Yes       07/03/2022    1:33 PM  MMSE - Mini Mental State Exam  Not completed: Unable to complete        07/03/2022    1:33 PM 04/22/2019   10:29 AM 04/20/2018    1:17 PM 12/30/2016   10:08 AM  6CIT Screen  What Year? 0 points 0 points 0 points 0 points  What month? 0 points 0 points 0 points 0 points  What time? 0 points 0 points  0 points  Count back from 20 0 points 0 points 0 points 0 points  Months in reverse 2 points 0 points 0 points 0 points  Repeat phrase 0 points 0 points 0 points 0 points  Total Score 2 points 0 points  0 points    Immunizations Immunization History  Administered Date(s) Administered   Fluad Quad(high Dose 65+) 10/06/2019, 08/18/2020, 08/14/2021   H1N1 11/15/2008   Influenza Split 08/30/2011, 08/14/2012   Influenza Whole 09/15/2009, 09/26/2010   Influenza,inj,Quad PF,6+ Mos 10/18/2013, 08/25/2014,  12/13/2015, 08/05/2016, 09/22/2017, 07/28/2018   Moderna SARS-COV2 Booster Vaccination 08/25/2021   Moderna Sars-Covid-2 Vaccination 01/16/2020, 02/16/2020, 11/10/2020, 04/16/2021   Pneumococcal Conjugate-13 05/03/2015   Pneumococcal Polysaccharide-23 08/05/2016   Td 03/02/2004   Tdap 12/12/2011   Zoster Recombinat (Shingrix) 09/13/2019, 11/15/2019    TDAP status: Due, Education has been provided regarding the importance of this vaccine. Advised may receive this vaccine at local pharmacy or Health Dept. Aware to provide a copy of the vaccination record if obtained from local pharmacy or Health Dept. Verbalized acceptance and understanding.  Flu Vaccine status: Up to date   Covid-19 vaccine  status: Completed vaccines  Qualifies for Shingles Vaccine? Yes   Zostavax completed Yes   Shingrix Completed?: Yes  Screening Tests Health Maintenance  Topic Date Due   COVID-19 Vaccine (5 - Moderna series) 10/20/2021   TETANUS/TDAP  12/11/2021   INFLUENZA VACCINE  07/09/2022   MAMMOGRAM  07/20/2023   COLONOSCOPY (Pts 45-18yr Insurance coverage will need to be confirmed)  09/12/2031   Pneumonia Vaccine 72 Years old  Completed   DEXA SCAN  Completed   Hepatitis C Screening  Completed   Zoster Vaccines- Shingrix  Completed   HPV VACCINES  Aged Out    Health Maintenance  Health Maintenance Due  Topic Date Due   COVID-19 Vaccine (5 - Moderna series) 10/20/2021   TETANUS/TDAP  12/11/2021    Colorectal cancer screening: Type of screening: Colonoscopy. Completed 09/11/21. Repeat every 10 years  Mammogram status: Completed 07/19/21. Repeat every year  Bone Density status: Completed 07/04/20. Results reflect: Bone density results: OSTEOPENIA. Repeat every 2 years.  Lung Cancer Screening: (Low Dose CT Chest recommended if Age 72-80years, 30 pack-year currently smoking OR have quit w/in 15years.) does not qualify.   Lung Cancer Screening Referral: NO  Additional  Screening:  Hepatitis C Screening: does qualify; Completed 12/13/15  Vision Screening: Recommended annual ophthalmology exams for early detection of glaucoma and other disorders of the eye. Is the patient up to date with their annual eye exam?  No  Who is the provider or what is the name of the office in which the patient attends annual eye exams? /a If pt is not established with a provider, would they like to be referred to a provider to establish care? No .   Dental Screening: Recommended annual dental exams for proper oral hygiene  Community Resource Referral / Chronic Care Management: CRR required this visit?  No   CCM required this visit?  No      Plan:     I have personally reviewed and noted the following in the patient's chart:   Medical and social history Use of alcohol, tobacco or illicit drugs  Current medications and supplements including opioid prescriptions.  Functional ability and status Nutritional status Physical activity Advanced directives List of other physicians Hospitalizations, surgeries, and ER visits in previous 12 months Vitals Screenings to include cognitive, depression, and falls Referrals and appointments  In addition, I have reviewed and discussed with patient certain preventive protocols, quality metrics, and best practice recommendations. A written personalized care plan for preventive services as well as general preventive health recommendations were provided to patient.     KQuentin Angst CRenovo  07/03/2022

## 2022-07-07 IMAGING — MG MM DIGITAL SCREENING BILAT W/ TOMO AND CAD
6 of 10 series · 6 of 30 positions shown · non-contrast
Comparison: Previous exam(s).

CLINICAL DATA: Screening.

EXAM:
DIGITAL SCREENING BILATERAL MAMMOGRAM WITH TOMOSYNTHESIS AND CAD
TECHNIQUE: Bilateral screening digital craniocaudal and mediolateral oblique
mammograms were obtained. Bilateral screening digital breast
tomosynthesis was performed. The images were evaluated with
computer-aided detection.

[L CC synth-2D (1 of 2)]
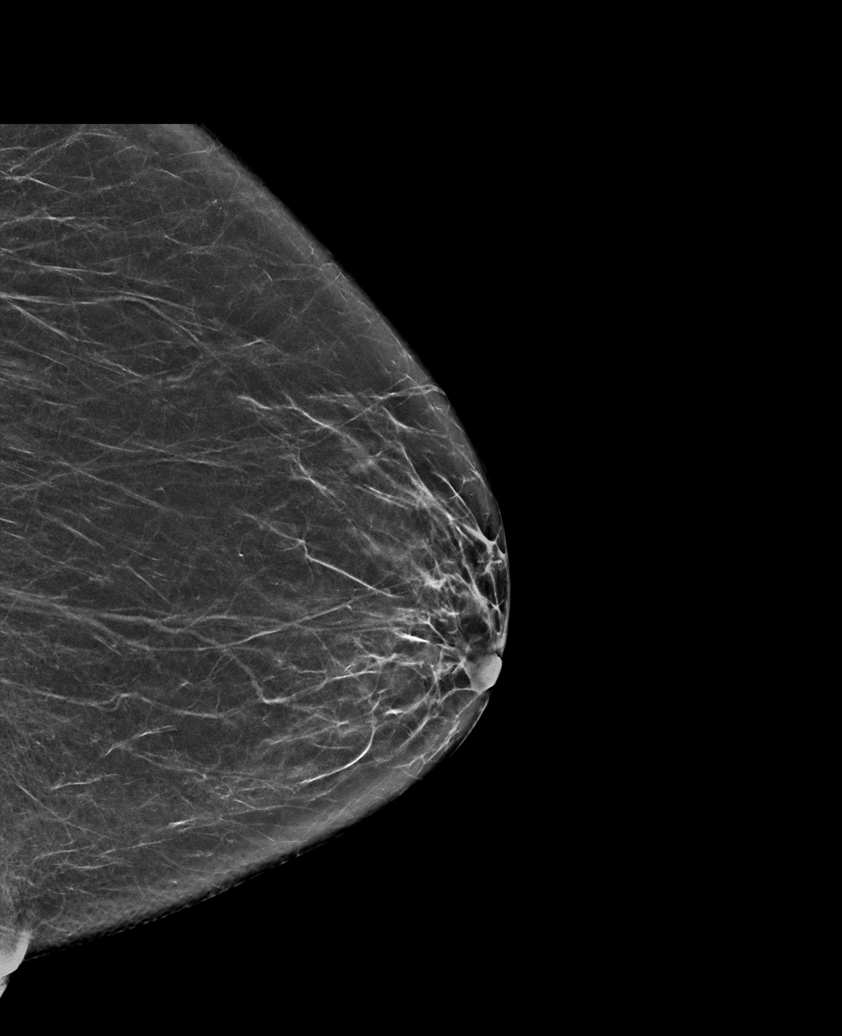

[L CC synth-2D (2 of 2)]
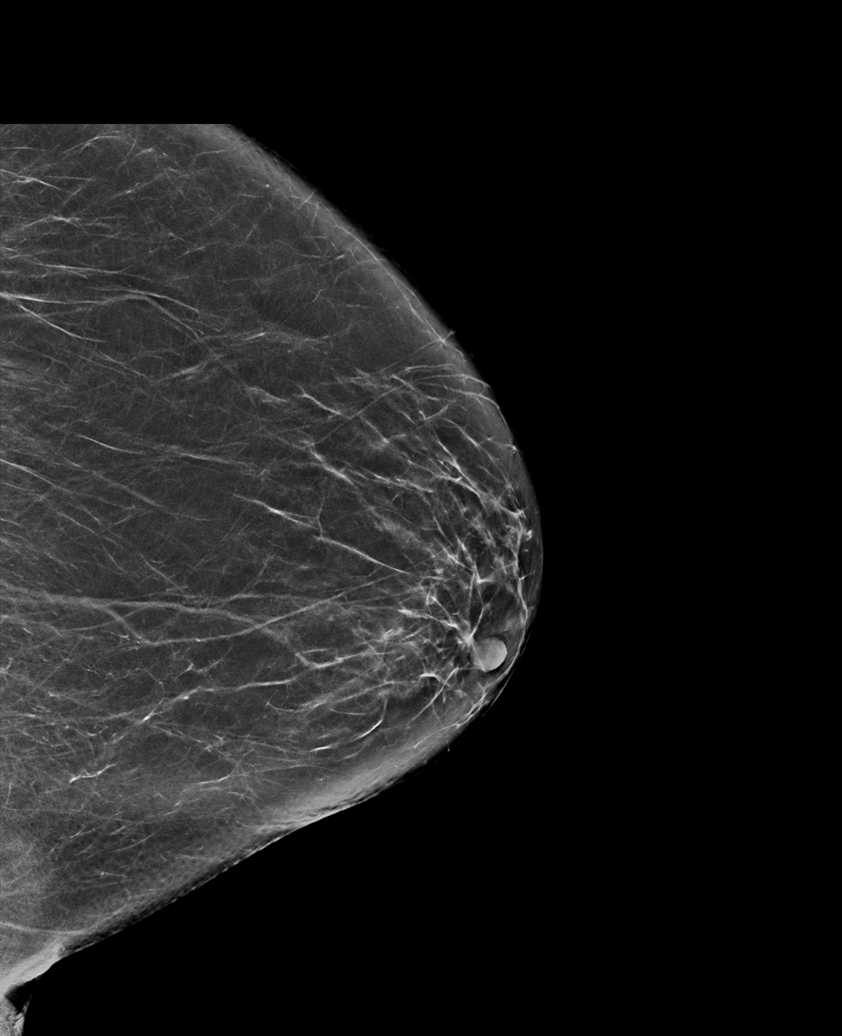

[R MLO synth-2D]
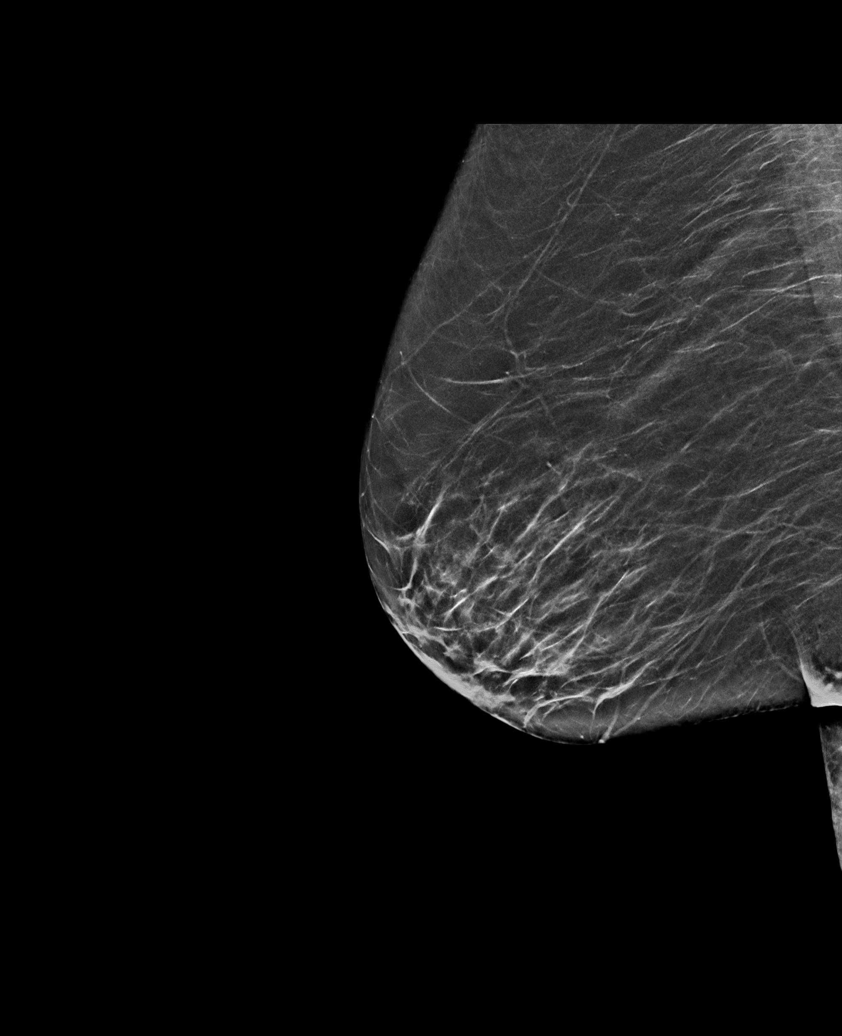

[L MLO synth-2D]
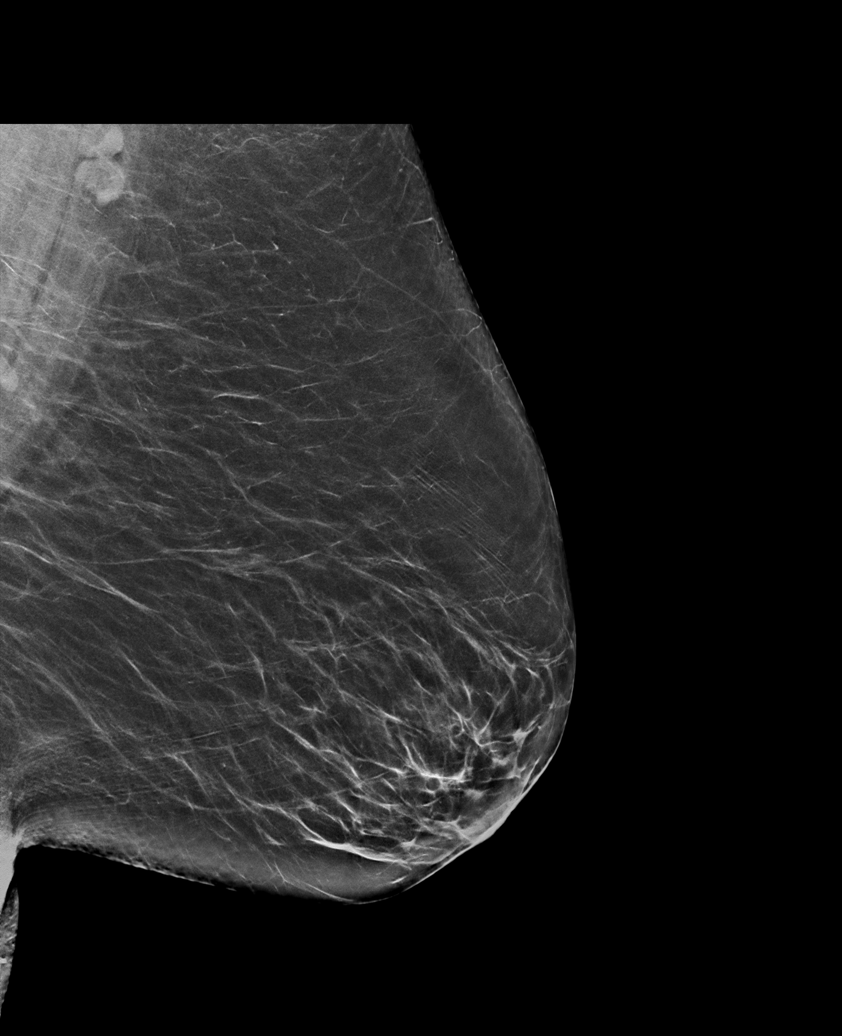

[R CC synth-2D]
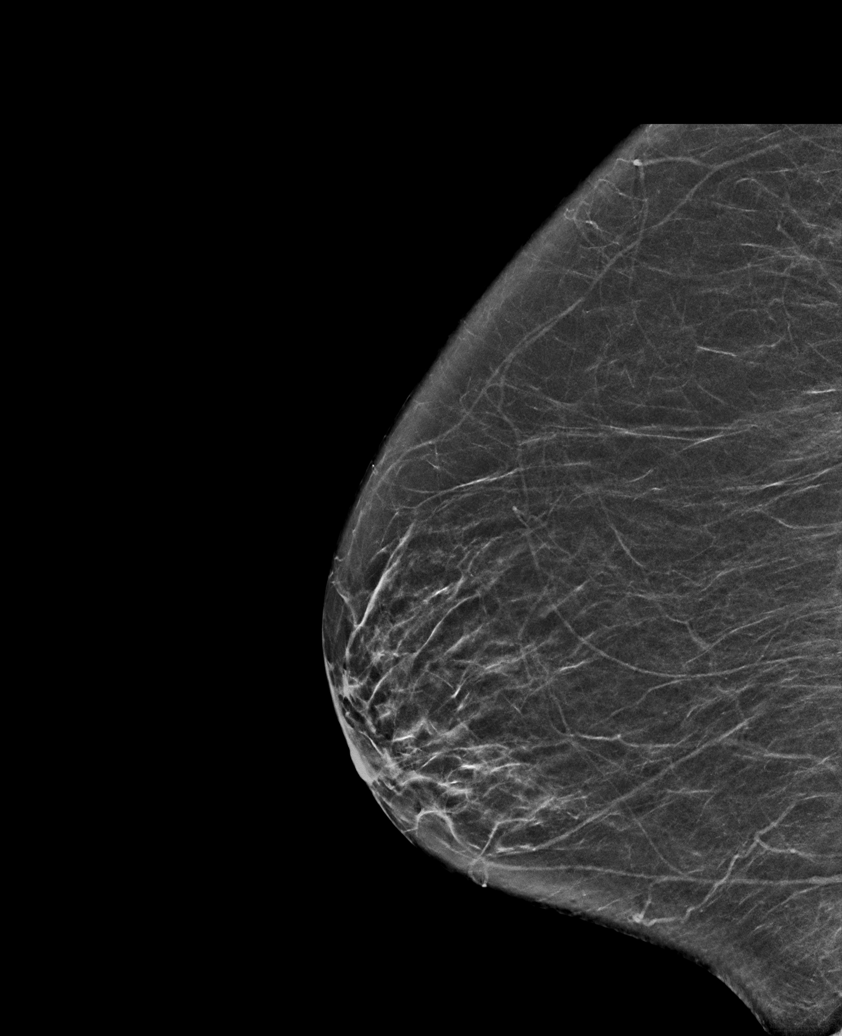

[R MLO tomo · tomo slice 31/61.0]
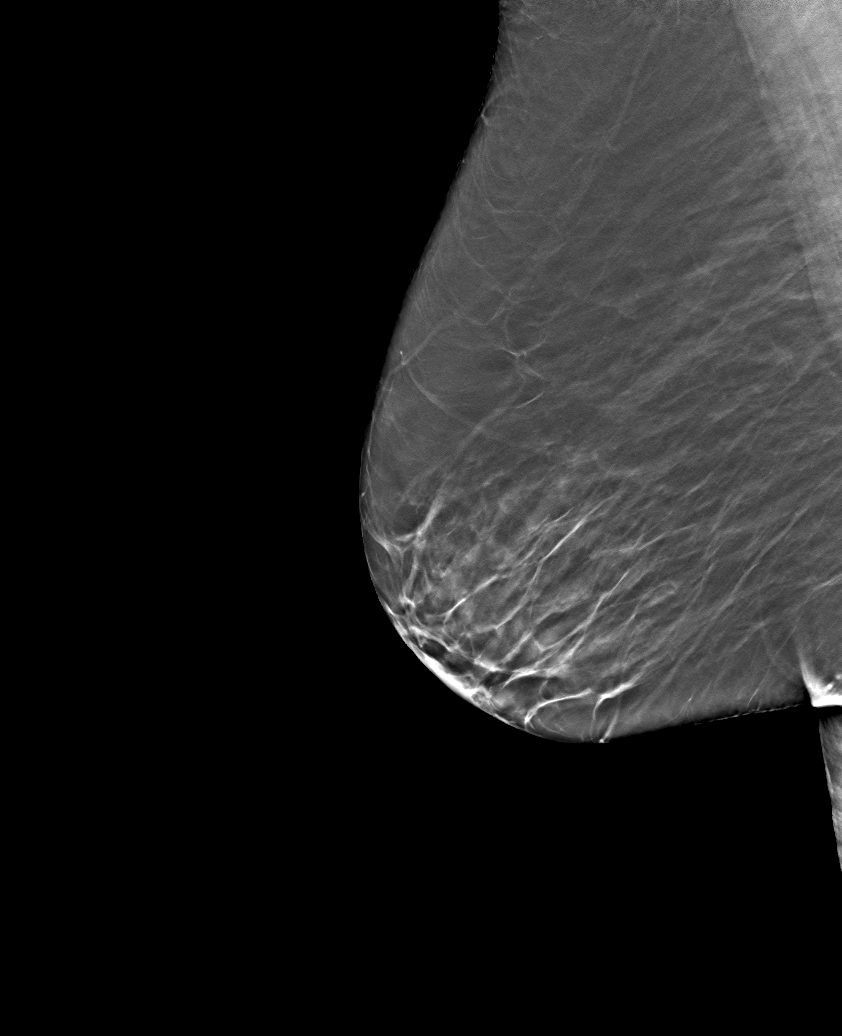

[6 of 30 positions shown; findings below may reference images not displayed]

ACR Breast Density Category b: There are scattered areas of
fibroglandular density.
FINDINGS: There are no findings suspicious for malignancy.
IMPRESSION: No mammographic evidence of malignancy. A result letter of this
screening mammogram will be mailed directly to the patient.

RECOMMENDATION:
Screening mammogram in one year. (Code:51-O-LD2)

BI-RADS CATEGORY  1: Negative.

## 2022-07-22 ENCOUNTER — Ambulatory Visit
Admission: RE | Admit: 2022-07-22 | Discharge: 2022-07-22 | Disposition: A | Discharge status: Home / Self Care | Payer: Medicare Other | Source: Ambulatory Visit | Attending: Family Medicine | Admitting: Family Medicine

## 2022-07-22 ENCOUNTER — Ambulatory Visit (HOSPITAL_COMMUNITY): Payer: Medicare Other

## 2022-08-02 ENCOUNTER — Telehealth: Payer: Self-pay

## 2022-08-02 NOTE — Telephone Encounter (Signed)
Called patient left voicemail to call our office need to schedule a medical clearance for dental procedure. The form up front folder at front desk

## 2022-08-15 ENCOUNTER — Other Ambulatory Visit: Payer: Self-pay | Admitting: "Endocrinology

## 2022-10-01 ENCOUNTER — Ambulatory Visit (INDEPENDENT_AMBULATORY_CARE_PROVIDER_SITE_OTHER): Payer: Medicare Other | Admitting: Family Medicine

## 2022-10-01 ENCOUNTER — Encounter: Payer: Self-pay | Admitting: Family Medicine

## 2022-10-01 VITALS — BP 135/81 | HR 86 | Ht 67.0 in | Wt 194.0 lb

## 2022-10-01 DIAGNOSIS — M5410 Radiculopathy, site unspecified: Secondary | ICD-10-CM

## 2022-10-01 DIAGNOSIS — E038 Other specified hypothyroidism: Secondary | ICD-10-CM

## 2022-10-01 DIAGNOSIS — E559 Vitamin D deficiency, unspecified: Secondary | ICD-10-CM

## 2022-10-01 DIAGNOSIS — Z9109 Other allergy status, other than to drugs and biological substances: Secondary | ICD-10-CM

## 2022-10-01 DIAGNOSIS — I10 Essential (primary) hypertension: Secondary | ICD-10-CM | POA: Diagnosis not present

## 2022-10-01 DIAGNOSIS — E782 Mixed hyperlipidemia: Secondary | ICD-10-CM

## 2022-10-01 DIAGNOSIS — Z23 Encounter for immunization: Secondary | ICD-10-CM | POA: Diagnosis not present

## 2022-10-01 MED ORDER — METHYLPREDNISOLONE ACETATE 80 MG/ML IJ SUSP
80.0000 mg | Freq: Once | INTRAMUSCULAR | Status: AC
  Administered 2022-10-01: 80 mg via INTRAMUSCULAR

## 2022-10-01 MED ORDER — KETOROLAC TROMETHAMINE 60 MG/2ML IM SOLN
60.0000 mg | Freq: Once | INTRAMUSCULAR | Status: AC
  Administered 2022-10-01: 60 mg via INTRAMUSCULAR

## 2022-10-01 NOTE — Patient Instructions (Addendum)
F/U in early March, call if you need me sooner, cancel November follow up   Flu vaccine today  Please get TdaP and RSV vaccines at your pharmacy, end Novemebr  Toradol 60 mg and depo medrol 80 mg iM in office for back pain  Fasting labs next week, lipid, cmp and EGFr, CBC and Vit D'   Careful not to fall  Thanks for choosing [facility], we consider it a privelige to serve you.

## 2022-10-06 ENCOUNTER — Encounter: Payer: Self-pay | Admitting: Family Medicine

## 2022-10-06 NOTE — Assessment & Plan Note (Signed)
Controlled, no change in medication  

## 2022-10-06 NOTE — Progress Notes (Signed)
   [name]     MRN: [id number]      DOB: 07/19/1951   HPI [name] is here for follow up and re-evaluation of chronic medical conditions, medication management and review of any available recent lab and radiology data.  Preventive health is updated, specifically  Cancer screening and Immunization.   Has upcoming dental work and as form to be completed and sent giving clearance The PT denies any adverse reactions to current medications since the last visit.  Back pain currently rated at a 10, requests shot at the visit   ROS Denies recent fever or chills. Denies sinus pressure, nasal congestion, ear pain or sore throat. Denies chest congestion, productive cough or wheezing. Denies chest pains, palpitations and leg swelling Denies abdominal pain, nausea, vomiting,diarrhea or constipation.   Denies dysuria, frequency, hesitancy or incontinence. Denies headaches, seizures, numbness, or tingling. Denies depression, uncontrolled  anxiety or insomnia. Denies skin break down or rash.   PE  BP 135/81 (BP Location: Right Arm, Patient Position: Sitting, Cuff Size: Large)   Pulse 86   Ht '5\' 7"'$  (1.702 m)   Wt 194 lb (88 kg)   SpO2 94%   BMI 30.38 kg/m   Patient alert and oriented and in no cardiopulmonary distress.  HEENT: No facial asymmetry, EOMI,     Neck supple .  Chest: Clear to auscultation bilaterally.  CVS: S1, S2 no murmurs, no S3.Regular rate.  ABD: Soft non tender.   Ext: No edema  MS: decreased  ROM spine, shoulders, hips and knees.  Skin: Intact, no ulcerations or rash noted.  Psych:  normal affect. Memory intact not anxious or depressed appearing.  CNS: CN 2-12 intact, power,  normal throughout.no focal deficits noted.   Assessment & Plan  Essential hypertension Controlled, no change in medication DASH diet and commitment to daily physical activity for a minimum of 30 minutes discussed and encouraged, as a part of hypertension management. The  importance of attaining a healthy weight is also discussed.     10/01/2022   11:30 AM 04/23/2022   10:35 AM 04/23/2022   10:10 AM 04/17/2022    8:54 AM 11/20/2021   10:29 AM 10/31/2021   10:13 AM 09/12/2021    9:36 AM  BP/Weight  Systolic BP 250 539 767 341 937 902 409  Diastolic BP 81 84 85 78 83 82 75  Wt. (Lbs) 194  197.12 199 197.04 195 193  BMI 30.38 kg/m2  30.87 kg/m2 31.17 kg/m2 30.86 kg/m2 30.54 kg/m2 30.23 kg/m2       Hypothyroidism Controlled, no change in medication   Mixed hyperlipidemia Hyperlipidemia:Low fat diet discussed and encouraged.   Lipid Panel  Lab Results  Component Value Date   CHOL 189 11/20/2021   HDL 64 11/20/2021   LDLCALC 108 (H) 11/20/2021   TRIG 94 11/20/2021   CHOLHDL 3.0 11/20/2021     Updated lab needed at/ before next visit.   Multiple environmental allergies Controlled, no change in medication   Back pain with radiculopathy Uncontrolled.Toradol and depo medrol administered IM in the office

## 2022-10-06 NOTE — Assessment & Plan Note (Signed)
Uncontrolled.Toradol and depo medrol administered IM in the office . 

## 2022-10-06 NOTE — Assessment & Plan Note (Signed)
Hyperlipidemia:Low fat diet discussed and encouraged.   Lipid Panel  Lab Results  Component Value Date   CHOL 189 11/20/2021   HDL 64 11/20/2021   LDLCALC 108 (H) 11/20/2021   TRIG 94 11/20/2021   CHOLHDL 3.0 11/20/2021     Updated lab needed at/ before next visit.

## 2022-10-06 NOTE — Assessment & Plan Note (Signed)
Controlled, no change in medication DASH diet and commitment to daily physical activity for a minimum of 30 minutes discussed and encouraged, as a part of hypertension management. The importance of attaining a healthy weight is also discussed.     10/01/2022   11:30 AM 04/23/2022   10:35 AM 04/23/2022   10:10 AM 04/17/2022    8:54 AM 11/20/2021   10:29 AM 10/31/2021   10:13 AM 09/12/2021    9:36 AM  BP/Weight  Systolic BP 499 692 493 241 991 444 584  Diastolic BP 81 84 85 78 83 82 75  Wt. (Lbs) 194  197.12 199 197.04 195 193  BMI 30.38 kg/m2  30.87 kg/m2 31.17 kg/m2 30.86 kg/m2 30.54 kg/m2 30.23 kg/m2

## 2022-10-09 LAB — LIPID PANEL
Chol/HDL Ratio: 2.6 ratio (ref 0.0–4.4)
Cholesterol, Total: 177 mg/dL (ref 100–199)
HDL: 67 mg/dL (ref >39)
LDL Chol Calc (NIH): 99 mg/dL (ref 0–99)
Triglycerides: 57 mg/dL (ref 0–149)
VLDL Cholesterol Cal: 11 mg/dL (ref 5–40)

## 2022-10-09 LAB — CMP14+EGFR
ALT: 21 IU/L (ref 0–32)
AST: 21 IU/L (ref 0–40)
Albumin/Globulin Ratio: 1.4 (ref 1.2–2.2)
Albumin: 4.1 g/dL (ref 3.8–4.8)
Alkaline Phosphatase: 56 IU/L (ref 44–121)
BUN/Creatinine Ratio: 23 (ref 12–28)
BUN: 17 mg/dL (ref 8–27)
Bilirubin Total: 0.2 mg/dL (ref 0.0–1.2)
CO2: 21 mmol/L (ref 20–29)
Calcium: 9.2 mg/dL (ref 8.7–10.3)
Chloride: 106 mmol/L (ref 96–106)
Creatinine, Ser: 0.75 mg/dL (ref 0.57–1.00)
Globulin, Total: 2.9 g/dL (ref 1.5–4.5)
Glucose: 88 mg/dL (ref 70–99)
Potassium: 4.1 mmol/L (ref 3.5–5.2)
Sodium: 142 mmol/L (ref 134–144)
Total Protein: 7 g/dL (ref 6.0–8.5)
eGFR: 85 mL/min/{1.73_m2} (ref >59)

## 2022-10-09 LAB — CBC

## 2022-10-09 LAB — VITAMIN D 25 HYDROXY (VIT D DEFICIENCY, FRACTURES): Vit D, 25-Hydroxy: 35.4 ng/mL (ref 30.0–100.0)

## 2022-10-10 ENCOUNTER — Other Ambulatory Visit: Payer: Self-pay

## 2022-10-10 DIAGNOSIS — I10 Essential (primary) hypertension: Secondary | ICD-10-CM

## 2022-10-11 LAB — CBC
Hematocrit: 39.5 % (ref 34.0–46.6)
Hemoglobin: 13.4 g/dL (ref 11.1–15.9)
MCH: 32.1 pg (ref 26.6–33.0)
MCHC: 33.9 g/dL (ref 31.5–35.7)
MCV: 95 fL (ref 79–97)
Platelets: 196 10*3/uL (ref 150–450)
RBC: 4.17 x10E6/uL (ref 3.77–5.28)
RDW: 12.8 % (ref 11.7–15.4)
WBC: 5.8 10*3/uL (ref 3.4–10.8)

## 2022-10-15 ENCOUNTER — Other Ambulatory Visit: Payer: Self-pay | Admitting: Internal Medicine

## 2022-10-15 DIAGNOSIS — J309 Allergic rhinitis, unspecified: Secondary | ICD-10-CM

## 2022-10-29 ENCOUNTER — Ambulatory Visit: Payer: Medicare Other | Admitting: Family Medicine

## 2022-10-29 ENCOUNTER — Encounter: Payer: Self-pay | Admitting: Family Medicine

## 2022-11-13 ENCOUNTER — Other Ambulatory Visit: Payer: Self-pay | Admitting: Family Medicine

## 2022-11-13 ENCOUNTER — Other Ambulatory Visit: Payer: Self-pay | Admitting: "Endocrinology

## 2022-11-19 ENCOUNTER — Telehealth: Payer: Self-pay | Admitting: Family Medicine

## 2022-11-19 NOTE — Telephone Encounter (Signed)
PCS forms  (appt 12.29.2023)  Copied Noted sleeved

## 2022-12-06 ENCOUNTER — Ambulatory Visit (INDEPENDENT_AMBULATORY_CARE_PROVIDER_SITE_OTHER): Payer: Medicare Other | Admitting: Family Medicine

## 2022-12-06 ENCOUNTER — Encounter: Payer: Self-pay | Admitting: Family Medicine

## 2022-12-06 VITALS — BP 132/78 | HR 79 | Ht 67.0 in | Wt 198.0 lb

## 2022-12-06 DIAGNOSIS — J309 Allergic rhinitis, unspecified: Secondary | ICD-10-CM

## 2022-12-06 DIAGNOSIS — I10 Essential (primary) hypertension: Secondary | ICD-10-CM

## 2022-12-06 DIAGNOSIS — E782 Mixed hyperlipidemia: Secondary | ICD-10-CM | POA: Diagnosis not present

## 2022-12-06 DIAGNOSIS — Z9109 Other allergy status, other than to drugs and biological substances: Secondary | ICD-10-CM

## 2022-12-06 DIAGNOSIS — J45991 Cough variant asthma: Secondary | ICD-10-CM

## 2022-12-06 DIAGNOSIS — M5410 Radiculopathy, site unspecified: Secondary | ICD-10-CM

## 2022-12-06 DIAGNOSIS — H548 Legal blindness, as defined in USA: Secondary | ICD-10-CM

## 2022-12-06 DIAGNOSIS — Z742 Need for assistance at home and no other household member able to render care: Secondary | ICD-10-CM | POA: Diagnosis not present

## 2022-12-06 DIAGNOSIS — F411 Generalized anxiety disorder: Secondary | ICD-10-CM

## 2022-12-06 NOTE — Patient Instructions (Addendum)
F/U as before, call if you need me sooner  No changes in medication  Continue  to be careful  Fasting lipid, cmp and eGFR  3 to 5 days  It is important that you exercise regularly at least 30 minutes 5 times a week. If you develop chest pain, have severe difficulty breathing, or feel very tired, stop exercising immediately and seek medical attention    With your arthritis, impaired vision 4 hours , 5 days per week for housework and meal prep  Thanks for choosing [facility], we consider it a privelige to serve you.

## 2022-12-07 ENCOUNTER — Other Ambulatory Visit: Payer: Self-pay | Admitting: Family Medicine

## 2022-12-08 ENCOUNTER — Encounter: Payer: Self-pay | Admitting: Family Medicine

## 2022-12-08 DIAGNOSIS — Z742 Need for assistance at home and no other household member able to render care: Secondary | ICD-10-CM | POA: Insufficient documentation

## 2022-12-08 NOTE — Assessment & Plan Note (Signed)
Controlled, no change in medication  

## 2022-12-08 NOTE — Progress Notes (Signed)
   [name]     MRN: [id number]      DOB: [birth year]   HPI [name] is here for follow up and re-evaluation of chronic medical conditions, medication management and review of any available recent lab and radiology data.  Preventive health is updated, specifically  Cancer screening and Immunization.   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 Denies sinus pressure, nasal congestion, ear pain or sore throat. Denies chest congestion, productive cough or wheezing. Denies chest pains, palpitations and leg swelling Denies abdominal pain, nausea, vomiting,diarrhea or constipation.   Denies dysuria, frequency, hesitancy  Denies headaches, seizures,  Denies depression, anxiety or insomnia. Denies skin break down or rash.   PE  BP 132/78   Pulse 79   Ht '5\' 7"'$  (1.702 m)   Wt 198 lb (89.8 kg)   SpO2 94%   BMI 31.01 kg/m   Patient alert and oriented and in no cardiopulmonary distress.  HEENT: No facial asymmetry, EOMI,     Neck decreased ROM .  Chest: Clear to auscultation bilaterally.  CVS: S1, S2 no murmurs, no S3.Regular rate.  ABD: Soft non tender.   Ext: No edema  MS: decreased  ROM spine, shoulders, hips and knees.  Skin: Intact, no ulcerations or rash noted.  Psych: Good eye contact, normal affect. Memory intact not anxious or depressed appearing.  CNS: CN 2-12 intact, power,  normal throughout.no focal deficits noted.   Assessment & Plan  Essential hypertension Controlled, no change in medication   Back pain with radiculopathy Severe arthritis with limitation in safe mobility, assistance with cleaning home environment is appropriate  Legally blind Incapable of independent food  prep as well as keeping home environ clean and safe, need for assistance with housekeeping and food prep medically indicated due to co morbidities   Asthma, cough variant No recent flares, stable and controlled on meds  Allergic sinusitis Controlled, no change in medication   Multiple environmental allergies Controlled, no change in medication   GAD (generalized anxiety disorder) Controlled, no change in medication   Mixed hyperlipidemia Hyperlipidemia:Low fat diet discussed and encouraged.   Lipid Panel  Lab Results  Component Value Date   CHOL 177 10/07/2022   HDL 67 10/07/2022   LDLCALC 99 10/07/2022   TRIG 57 10/07/2022   CHOLHDL 2.6 10/07/2022     Controlled, no change in medication   Need for assistance at home without other household member able to render care CAP form completed to request 4 hrs assistance / day , five days per week

## 2022-12-08 NOTE — Assessment & Plan Note (Signed)
Severe arthritis with limitation in safe mobility, assistance with cleaning home environment is appropriate

## 2022-12-08 NOTE — Assessment & Plan Note (Signed)
No recent flares, stable and controlled on meds

## 2022-12-08 NOTE — Assessment & Plan Note (Signed)
Hyperlipidemia:Low fat diet discussed and encouraged.   Lipid Panel  Lab Results  Component Value Date   CHOL 177 10/07/2022   HDL 67 10/07/2022   LDLCALC 99 10/07/2022   TRIG 57 10/07/2022   CHOLHDL 2.6 10/07/2022     Controlled, no change in medication

## 2022-12-08 NOTE — Assessment & Plan Note (Signed)
CAP form completed to request 4 hrs assistance / day , five days per week

## 2022-12-08 NOTE — Assessment & Plan Note (Signed)
Incapable of independent food prep as well as keeping home environ clean and safe, need for assistance with housekeeping and food prep medically indicated due to co morbidities

## 2022-12-12 ENCOUNTER — Other Ambulatory Visit: Payer: Self-pay | Admitting: Family Medicine

## 2023-01-10 ENCOUNTER — Other Ambulatory Visit: Payer: Self-pay | Admitting: Family Medicine

## 2023-02-11 ENCOUNTER — Other Ambulatory Visit: Payer: Self-pay | Admitting: "Endocrinology

## 2023-02-11 ENCOUNTER — Other Ambulatory Visit: Payer: Self-pay | Admitting: Family Medicine

## 2023-02-13 ENCOUNTER — Encounter: Payer: Self-pay | Admitting: Family Medicine

## 2023-02-13 ENCOUNTER — Ambulatory Visit (INDEPENDENT_AMBULATORY_CARE_PROVIDER_SITE_OTHER): Payer: Medicare Other | Admitting: Family Medicine

## 2023-02-13 VITALS — BP 130/84 | HR 74 | Ht 67.0 in | Wt 200.1 lb

## 2023-02-13 DIAGNOSIS — F322 Major depressive disorder, single episode, severe without psychotic features: Secondary | ICD-10-CM

## 2023-02-13 DIAGNOSIS — M5410 Radiculopathy, site unspecified: Secondary | ICD-10-CM | POA: Diagnosis not present

## 2023-02-13 DIAGNOSIS — H548 Legal blindness, as defined in USA: Secondary | ICD-10-CM | POA: Diagnosis not present

## 2023-02-13 DIAGNOSIS — I10 Essential (primary) hypertension: Secondary | ICD-10-CM | POA: Diagnosis not present

## 2023-02-13 DIAGNOSIS — F411 Generalized anxiety disorder: Secondary | ICD-10-CM

## 2023-02-13 DIAGNOSIS — K219 Gastro-esophageal reflux disease without esophagitis: Secondary | ICD-10-CM

## 2023-02-13 DIAGNOSIS — J45991 Cough variant asthma: Secondary | ICD-10-CM

## 2023-02-13 DIAGNOSIS — E038 Other specified hypothyroidism: Secondary | ICD-10-CM

## 2023-02-13 DIAGNOSIS — Z1231 Encounter for screening mammogram for malignant neoplasm of breast: Secondary | ICD-10-CM

## 2023-02-13 MED ORDER — PREDNISONE 5 MG PO TABS: 5.0000 mg | ORAL_TABLET | Freq: Two times a day (BID) | ORAL | 0 refills | Status: AC

## 2023-02-13 MED ORDER — METHYLPREDNISOLONE ACETATE 80 MG/ML IJ SUSP
40.0000 mg | Freq: Once | INTRAMUSCULAR | Status: AC
  Administered 2023-02-13: 40 mg via INTRAMUSCULAR

## 2023-02-13 MED ORDER — ALBUTEROL SULFATE HFA 108 (90 BASE) MCG/ACT IN AERS: INHALATION_SPRAY | RESPIRATORY_TRACT | 3 refills | Status: AC

## 2023-02-13 MED ORDER — KETOROLAC TROMETHAMINE 60 MG/2ML IM SOLN
30.0000 mg | Freq: Once | INTRAMUSCULAR | Status: AC
  Administered 2023-02-13: 30 mg via INTRAMUSCULAR

## 2023-02-13 NOTE — Patient Instructions (Addendum)
F/U in mid September, call if you need me sooner  Please schedule mammogram at checkout  Please get TdAP at your pharmacy in next 2 weeks  You are referred for in home PT/OT twice weekly for 6 weeks ( Nurse pls order)  Toradol 39 mg  and Depo Medrol 40 mg IM for right sciatic pain and 5 day course os prednisone  Please get fasting lipid, cmp ordered in December ( nurse pls reprint) same day you are getting Dr Liliane Channel labs  Careful not to fall  Thanks for choosing [facility], we consider it a privelige to serve you.

## 2023-02-19 ENCOUNTER — Telehealth: Payer: Self-pay | Admitting: Family Medicine

## 2023-02-19 NOTE — Telephone Encounter (Signed)
Verbal order given  

## 2023-02-19 NOTE — Telephone Encounter (Signed)
[name], PT w/ [place], [phone]   Called stating she is wanting approval for physical therapy   2x wk for 3 wks & 1x wk for 1 wk

## 2023-02-20 ENCOUNTER — Encounter: Payer: Self-pay | Admitting: Family Medicine

## 2023-02-20 DIAGNOSIS — M5410 Radiculopathy, site unspecified: Secondary | ICD-10-CM | POA: Insufficient documentation

## 2023-02-20 MED ORDER — PAROXETINE HCL 40 MG PO TABS: 40.0000 mg | ORAL_TABLET | ORAL | 2 refills | Status: DC

## 2023-02-20 NOTE — Assessment & Plan Note (Signed)
Uncontrolled.Toradol and depo medrol administered IM in the office , to be followed by a short course of oral prednisone  Refer in home PT/OT twice weekly x 6 weeks to improve gait and home safety.

## 2023-02-20 NOTE — Assessment & Plan Note (Signed)
Controlled, no change in medication DASH diet and commitment to daily physical activity for a minimum of 30 minutes discussed and encouraged, as a part of hypertension management. The importance of attaining a healthy weight is also discussed.     02/13/2023   11:14 AM 02/13/2023   10:50 AM 02/13/2023   10:48 AM 12/06/2022    4:04 PM 12/06/2022    3:59 PM 10/01/2022   11:30 AM 04/23/2022   10:35 AM  BP/Weight  Systolic BP [id number] Q000111Q [id number]  Diastolic BP 84 82 82 78 83 81 84  Wt. (Lbs)   200.08  198 194   BMI   31.34 kg/m2  31.01 kg/m2 30.38 kg/m2

## 2023-02-20 NOTE — Progress Notes (Signed)
   [name]     MRN: [id number]      DOB: June 20, 1951   HPI [name] is here for follow up and re-evaluation of chronic medical conditions, medication management and review of any available recent lab and radiology data.  Preventive health is updated, specifically  Cancer screening and Immunization.   Questions or concerns regarding consultations or procedures which the PT has had in the interim are  addressed. The PT denies any adverse reactions to current medications since the last visit.  C/o increased right hip, lowe rback and lower extremity pain x 2 weks, no inciting factor, has recurrent flares of uncontrolled pain, no new weakness and numbness or incontinence,all unchanged and present, reports instability denies falls, uses assistive device , interested in home PT for improvement in balance and function C/o reduced mobility in shoulders with upper extremity weakness   ROS Denies recent fever or chills. Denies sinus pressure, nasal congestion, ear pain or sore throat. Denies chest congestion, productive cough or wheezing. Denies chest pains, palpitations and leg swelling Denies abdominal pain, nausea, vomiting,diarrhea or constipation.   Denies dysuria, frequency, hesitancy  Denies skin break down or rash.   PE  BP 130/84   Pulse 74   Ht 5\' 7"  (1.702 m)   Wt 200 lb 1.3 oz (90.8 kg)   SpO2 94%   BMI 31.34 kg/m   Patient alert and oriented and in no cardiopulmonary distress.  HEENT: No facial asymmetry, EOMI,     Neck decreased ROM Chest: Clear to auscultation bilaterally.  CVS: S1, S2 no murmurs, no S3.Regular rate.  ABD: Soft non tender.   Ext: No edema  MS: Decreased  ROM spine, shoulders, hips and knees.  Skin: Intact, no ulcerations or rash noted.  Psych: Good eye contact, normal affect. Memory intact not anxious or depressed appearing.  CNS: CN 2-12 intact, power,  normal throughout.no focal deficits noted.   Assessment & Plan  No  problem-specific Assessment & Plan notes found for this encounter.

## 2023-02-20 NOTE — Assessment & Plan Note (Signed)
Needs refill on rescue med as has needed to use this during past 4 months as expected, has asthma flare in Winter

## 2023-02-20 NOTE — Assessment & Plan Note (Signed)
Uncontrolled inc paxil to 40 mg and re assess

## 2023-02-20 NOTE — Assessment & Plan Note (Signed)
Controlled, no change in medication  

## 2023-02-20 NOTE — Assessment & Plan Note (Signed)
Uncontrolled score of 15 in 02/2023, increase paxil to 40 mg daily and re assess  in 6 to 8 weeks

## 2023-02-26 DIAGNOSIS — Z0279 Encounter for issue of other medical certificate: Secondary | ICD-10-CM

## 2023-03-05 ENCOUNTER — Telehealth: Payer: Self-pay | Admitting: Family Medicine

## 2023-03-05 NOTE — Telephone Encounter (Signed)
Sharyn Lull called from [facility] Dr Moshe Cipro has repeated referral to get personal care services, the initial form has not heard back from anyone.   Per Sharyn Lull said nurse can go website: [email or web address] and forms requesting is [id number] request for access of independent services.   (Form is 5 pages long and all has to be completed)  Adele Barthel Call back # [phone]

## 2023-03-05 NOTE — Telephone Encounter (Signed)
[id number] form filled out and will hold for signature

## 2023-03-11 ENCOUNTER — Other Ambulatory Visit: Payer: Self-pay | Admitting: Family Medicine

## 2023-03-11 ENCOUNTER — Telehealth: Payer: Self-pay | Admitting: Family Medicine

## 2023-03-11 NOTE — Telephone Encounter (Signed)
PCS Forms were faxed [phone].

## 2023-03-12 ENCOUNTER — Telehealth: Payer: Self-pay | Admitting: Family Medicine

## 2023-03-12 NOTE — Telephone Encounter (Signed)
Spoke with [name] confirmed paxil increased to 40 mg at last visit

## 2023-03-12 NOTE — Telephone Encounter (Signed)
Erlene Quan from [place] called needs for nurse to return call ASAP trying to bubble wrap patient medicine and has question on a dosage of a medication. Call back # [phone].

## 2023-03-17 ENCOUNTER — Telehealth: Payer: Self-pay

## 2023-03-17 NOTE — Transitions of Care (Post Inpatient/ED Visit) (Signed)
   03/17/2023  Name: [name] MRN: [id number] DOB: 15-May-1951  Today's TOC FU Call Status: Today's TOC FU Call Status:: Successful TOC FU Call Competed TOC FU Call Complete Date: 03/17/23  Transition Care Management Follow-up Telephone Call Date of Discharge: 03/14/23 Discharge Facility: Other ([facility]) Name of Other ([facility]) Discharge Facility: [facility] Type of Discharge: Inpatient Admission Primary Inpatient Discharge Diagnosis:: radiculopathy How have you been since you were released from the hospital?: Better Any questions or concerns?: No  Items Reviewed: Did you receive and understand the discharge instructions provided?: Yes Medications obtained and verified?: Yes (Medications Reviewed) Any new allergies since your discharge?: No Dietary orders reviewed?: Yes  Home Care and Equipment/Supplies: Were Home Health Services Ordered?: NA Any new equipment or medical supplies ordered?: NA  Functional Questionnaire: Do you need assistance with bathing/showering or dressing?: No Do you need assistance with meal preparation?: No Do you need assistance with eating?: No Do you have difficulty maintaining continence: No Do you need assistance with getting out of bed/getting out of a chair/moving?: No Do you have difficulty managing or taking your medications?: No  Follow up appointments reviewed: PCP Follow-up appointment confirmed?: NA Specialist Hospital Follow-up appointment confirmed?: NA Do you need transportation to your follow-up appointment?: No Do you understand care options if your condition(s) worsen?: Yes-patient verbalized understanding    SIGNATURE Karena Addison, LPN [facility] Nurse Health Advisor Direct Dial [phone]

## 2023-04-05 LAB — CMP14+EGFR
ALT: 19 IU/L (ref 0–32)
AST: 19 IU/L (ref 0–40)
Albumin/Globulin Ratio: 1.4 (ref 1.2–2.2)
Albumin: 3.9 g/dL (ref 3.8–4.8)
Alkaline Phosphatase: 49 IU/L (ref 44–121)
BUN/Creatinine Ratio: 28 (ref 12–28)
BUN: 22 mg/dL (ref 8–27)
Bilirubin Total: 0.2 mg/dL (ref 0.0–1.2)
CO2: 20 mmol/L (ref 20–29)
Calcium: 9.1 mg/dL (ref 8.7–10.3)
Chloride: 105 mmol/L (ref 96–106)
Creatinine, Ser: 0.79 mg/dL (ref 0.57–1.00)
Globulin, Total: 2.8 g/dL (ref 1.5–4.5)
Glucose: 96 mg/dL (ref 70–99)
Potassium: 4.3 mmol/L (ref 3.5–5.2)
Sodium: 142 mmol/L (ref 134–144)
Total Protein: 6.7 g/dL (ref 6.0–8.5)
eGFR: 79 mL/min/{1.73_m2} (ref >59)

## 2023-04-05 LAB — LIPID PANEL
Chol/HDL Ratio: 2.8 ratio (ref 0.0–4.4)
Chol/HDL Ratio: 2.9 ratio (ref 0.0–4.4)
Cholesterol, Total: 163 mg/dL (ref 100–199)
Cholesterol, Total: 166 mg/dL (ref 100–199)
HDL: 57 mg/dL (ref >39)
HDL: 58 mg/dL (ref >39)
LDL Chol Calc (NIH): 89 mg/dL (ref 0–99)
LDL Chol Calc (NIH): 93 mg/dL (ref 0–99)
Triglycerides: 85 mg/dL (ref 0–149)
Triglycerides: 89 mg/dL (ref 0–149)
VLDL Cholesterol Cal: 16 mg/dL (ref 5–40)
VLDL Cholesterol Cal: 16 mg/dL (ref 5–40)

## 2023-04-05 LAB — TSH: TSH: 0.994 u[IU]/mL (ref 0.450–4.500)

## 2023-04-05 LAB — T4, FREE: Free T4: 1.37 ng/dL (ref 0.82–1.77)

## 2023-04-08 ENCOUNTER — Other Ambulatory Visit: Payer: Self-pay | Admitting: Family Medicine

## 2023-04-08 ENCOUNTER — Other Ambulatory Visit: Payer: Self-pay | Admitting: "Endocrinology

## 2023-04-11 ENCOUNTER — Other Ambulatory Visit: Payer: Self-pay | Admitting: Family Medicine

## 2023-04-18 ENCOUNTER — Ambulatory Visit (INDEPENDENT_AMBULATORY_CARE_PROVIDER_SITE_OTHER): Payer: Medicare Other | Admitting: "Endocrinology

## 2023-04-18 ENCOUNTER — Encounter: Payer: Self-pay | Admitting: "Endocrinology

## 2023-04-18 VITALS — BP 134/78 | HR 80 | Ht 67.0 in | Wt 193.4 lb

## 2023-04-18 DIAGNOSIS — E782 Mixed hyperlipidemia: Secondary | ICD-10-CM

## 2023-04-18 DIAGNOSIS — E039 Hypothyroidism, unspecified: Secondary | ICD-10-CM

## 2023-04-18 DIAGNOSIS — R7303 Prediabetes: Secondary | ICD-10-CM | POA: Diagnosis not present

## 2023-04-18 DIAGNOSIS — E049 Nontoxic goiter, unspecified: Secondary | ICD-10-CM

## 2023-04-18 LAB — POCT GLYCOSYLATED HEMOGLOBIN (HGB A1C): HbA1c, POC (controlled diabetic range): 5.8 % (ref 0.0–7.0)

## 2023-04-18 NOTE — Progress Notes (Signed)
04/18/2023  Endocrinology follow-up note   Subjective:    Patient ID: [name], female    DOB: 02-14-1951, PCP Kerri Perches, MD   Past Medical History:  Diagnosis Date   Allergy    Anxiety    Arthritis    Asthma    Hyperlipidemia    Hypertension    Vision abnormalities    states close to blindness due to cataracts   Past Surgical History:  Procedure Laterality Date   caesarean     cataracts     CESAREAN SECTION     COLONOSCOPY WITH PROPOFOL N/A 09/11/2021   Procedure: COLONOSCOPY WITH PROPOFOL;  Surgeon: Lanelle Bal, DO;  [facility]: [facility];  Service: Endoscopy;  Laterality: N/A;  10:30 / ASA II   POLYPECTOMY  09/11/2021   Procedure: POLYPECTOMY;  Surgeon: Lanelle Bal, DO;  Location: [facility];  Service: Endoscopy;;   TOTAL ABDOMINAL HYSTERECTOMY     prolapse   Social History   Socioeconomic History   Marital status: Single    Spouse name: Not on file   Number of children: Not on file   Years of education: 12th grade   Highest education level: Not on file  Occupational History   Occupation: disabled    Employer: NOT EMPLOYED  Tobacco Use   Smoking status: Former    Packs/day: 0.50    Years: 18.00    Additional pack years: 0.00    Total pack years: 9.00    Types: Cigarettes    Quit date: 1989    Years since quitting: 35.3   Smokeless tobacco: Never  Vaping Use   Vaping Use: Never used  Substance and Sexual Activity   Alcohol use: No   Drug use: No   Sexual activity: Not Currently  Other Topics Concern   Not on file  Social History Narrative   Not on file   Social Determinants of Health   Financial Resource Strain: Low Risk  (07/03/2022)   Overall Financial Resource Strain (CARDIA)    Difficulty of Paying Living Expenses: Not hard at all  Food Insecurity: No Food Insecurity (07/03/2022)   Hunger Vital Sign    Worried About Running Out of Food in the Last Year: Never true    Ran Out of Food in the Last Year: Never  true  Transportation Needs: No Transportation Needs (07/03/2022)   PRAPARE - Administrator, Civil Service (Medical): No    Lack of Transportation (Non-Medical): No  Physical Activity: Insufficiently Active (07/03/2022)   Exercise Vital Sign    Days of Exercise per Week: 7 days    Minutes of Exercise per Session: 20 min  Stress: No Stress Concern Present (07/03/2022)   [employer] of Occupational Health - Occupational Stress Questionnaire    Feeling of Stress : Not at all  Social Connections: Moderately Isolated (07/03/2022)   Social Connection and Isolation Panel [NHANES]    Frequency of Communication with Friends and Family: More than three times a week    Frequency of Social Gatherings with Friends and Family: More than three times a week    Attends Religious Services: More than 4 times per year    Active Member of [employer] or Organizations: No    Attends Banker Meetings: Never    Marital Status: Separated   Outpatient Encounter Medications as of 04/18/2023  Medication Sig   acetaminophen (TYLENOL) 650 MG CR tablet Take 650 mg by mouth as needed for pain.  albuterol (VENTOLIN HFA) 108 (90 Base) MCG/ACT inhaler INHALE 2 PUFFS INTO THE LUNGS EVERY 4 HOURS AS NEEDED FOR COUGHNG ANDWHEEZING.   alendronate (FOSAMAX) 70 MG tablet TAKE ONE TABLET BY MOUTH ON AN EMPTY STOMACH WITH A GLASS FULL OF WATER ONCE A WEEK   amLODipine (NORVASC) 10 MG tablet TAKE 1 TABLET BY MOUTH AT BEDTIME.   betamethasone dipropionate 0.05 % cream Apply topically 2 (two) times daily. Apply twice daily to rash on left elbow for 7 days, then as needed (Patient taking differently: Apply topically as needed. Apply twice daily to rash on left elbow for 7 days, then as needed)   calcium-vitamin D (OSCAL-500) 500-400 MG-UNIT tablet Take 1 tablet by mouth 2 (two) times daily.   cetirizine (ZYRTEC) 10 MG tablet TAKE (1) TABLET BY MOUTH DAILY.   Cholecalciferol (VITAMIN D3) 50 MCG (2000 UT) capsule  Take 2,000 Units by mouth daily.   clotrimazole-betamethasone (LOTRISONE) cream Apply twice daily to rash on upper back for 10 days, then as needed   DULERA 100-5 MCG/ACT AERO INHALE 2 PUFFS INTO THE LUNGS TWICE DAILY.   fluticasone (FLONASE) 50 MCG/ACT nasal spray PLACE 1 SPRAY INTO EACH NOSTRIL DAILY.   levothyroxine (SYNTHROID) 100 MCG tablet TAKE (1) TABLET BY MOUTH ONCE DAILY.   lovastatin (MEVACOR) 40 MG tablet TAKE (1) TABLET BY MOUTH AT BEDTIME.   montelukast (SINGULAIR) 10 MG tablet TAKE (1) TABLET BY MOUTH AT BEDTIME.   PARoxetine (PAXIL) 20 MG tablet TAKE ONE TABLET BY MOUTH DAILY.   PARoxetine (PAXIL) 40 MG tablet Take 1 tablet (40 mg total) by mouth every morning.   polyethylene glycol powder (GLYCOLAX/MIRALAX) 17 GM/SCOOP powder Take 17 g by mouth 2 (two) times daily as needed.   triamterene-hydrochlorothiazide (MAXZIDE-25) 37.5-25 MG tablet TAKE 1/2 TABLET BY MOUTH ONCE DAILY.   No facility-administered encounter medications on file as of 04/18/2023.   ALLERGIES: Allergies  Allergen Reactions   Lotensin Hct [Benazepril-Hydrochlorothiazide] Hives    VACCINATION STATUS: Immunization History  Administered Date(s) Administered   Fluad Quad(high Dose 65+) 10/06/2019, 08/18/2020, 08/14/2021, 10/01/2022   H1N1 11/15/2008   Influenza Split 08/30/2011, 08/14/2012   Influenza Whole 09/15/2009, 09/26/2010   Influenza,inj,Quad PF,6+ Mos 10/18/2013, 08/25/2014, 12/13/2015, 08/05/2016, 09/22/2017, 07/28/2018   Moderna SARS-COV2 Booster Vaccination 08/25/2021   Moderna Sars-Covid-2 Vaccination 01/16/2020, 02/16/2020, 11/10/2020, 04/16/2021, 08/30/2022   Pneumococcal Conjugate-13 05/03/2015   Pneumococcal Polysaccharide-23 08/05/2016   Td 03/02/2004   Tdap 12/12/2011   Zoster Recombinat (Shingrix) 09/13/2019, 11/15/2019    HPI 72 year old female patient with medical history as above.  She is returning for follow-up with repeat thyroid function test for follow-up of hypothyroidism,  hyperlipidemia, prediabetes. She is currently on stable dose of levothyroxine 100 mcg p.o. daily before breakfast.      She reports compliance with medication.  She has no new complaints today.   -She has a steady weight. - She is a poor historian accompanied by her sister.  - She denies dysphagia, shortness of breath, nor voice change. She had  a series of thyroid ultrasound studies, last one from 05/02/2017 showing 1 cm solitary nodule on the left lobe of the thyroid .  Her most recent surveillance thyroid ultrasound in 2021 reveals the same finding, unchanged. - Ultrasound description of the thyroid summarized below. 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  Her point-of-care A1c remains at 5.8%, still consistent with prediabetes.   She is on pravastatin for hyperlipidemia.  Current Outpatient Medications:  acetaminophen (TYLENOL) 650 MG CR tablet, Take 650 mg by mouth as needed for pain., Disp: , Rfl:    albuterol (VENTOLIN HFA) 108 (90 Base) MCG/ACT inhaler, INHALE 2 PUFFS INTO THE LUNGS EVERY 4 HOURS AS NEEDED FOR COUGHNG ANDWHEEZING., Disp: 18 g, Rfl: 3   alendronate (FOSAMAX) 70 MG tablet, TAKE ONE TABLET BY MOUTH ON AN EMPTY STOMACH WITH A GLASS FULL OF WATER ONCE A WEEK, Disp: 4 tablet, Rfl: 0   amLODipine (NORVASC) 10 MG tablet, TAKE 1 TABLET BY MOUTH AT BEDTIME., Disp: 30 tablet, Rfl: 0   betamethasone dipropionate 0.05 % cream, Apply topically 2 (two) times daily. Apply twice daily to rash on left elbow for 7 days, then as needed (Patient taking differently: Apply topically as needed. Apply twice daily to rash on left elbow for 7 days, then as needed), Disp: 45 g, Rfl: 0   calcium-vitamin D (OSCAL-500) 500-400 MG-UNIT tablet, Take 1 tablet by mouth 2 (two) times daily., Disp: 60 tablet, Rfl: 11   cetirizine (ZYRTEC) 10 MG tablet, TAKE (1) TABLET BY MOUTH DAILY., Disp: 30 tablet, Rfl: 0   Cholecalciferol (VITAMIN D3) 50  MCG (2000 UT) capsule, Take 2,000 Units by mouth daily., Disp: , Rfl:    clotrimazole-betamethasone (LOTRISONE) cream, Apply twice daily to rash on upper back for 10 days, then as needed, Disp: 45 g, Rfl: 1   DULERA 100-5 MCG/ACT AERO, INHALE 2 PUFFS INTO THE LUNGS TWICE DAILY., Disp: 13 g, Rfl: 0   fluticasone (FLONASE) 50 MCG/ACT nasal spray, PLACE 1 SPRAY INTO EACH NOSTRIL DAILY., Disp: 16 g, Rfl: 0   levothyroxine (SYNTHROID) 100 MCG tablet, TAKE (1) TABLET BY MOUTH ONCE DAILY., Disp: 30 tablet, Rfl: 1   lovastatin (MEVACOR) 40 MG tablet, TAKE (1) TABLET BY MOUTH AT BEDTIME., Disp: 30 tablet, Rfl: 0   montelukast (SINGULAIR) 10 MG tablet, TAKE (1) TABLET BY MOUTH AT BEDTIME., Disp: 30 tablet, Rfl: 0   PARoxetine (PAXIL) 20 MG tablet, TAKE ONE TABLET BY MOUTH DAILY., Disp: 30 tablet, Rfl: 0   PARoxetine (PAXIL) 40 MG tablet, Take 1 tablet (40 mg total) by mouth every morning., Disp: 30 tablet, Rfl: 2   polyethylene glycol powder (GLYCOLAX/MIRALAX) 17 GM/SCOOP powder, Take 17 g by mouth 2 (two) times daily as needed., Disp: 3350 g, Rfl: 1   triamterene-hydrochlorothiazide (MAXZIDE-25) 37.5-25 MG tablet, TAKE 1/2 TABLET BY MOUTH ONCE DAILY., Disp: 15 tablet, Rfl: 0  Past Medical History:  Diagnosis Date   Allergy    Anxiety    Arthritis    Asthma    Hyperlipidemia    Hypertension    Vision abnormalities    states close to blindness due to cataracts   Past Surgical History:  Procedure Laterality Date   caesarean     cataracts     CESAREAN SECTION     COLONOSCOPY WITH PROPOFOL N/A 09/11/2021   Procedure: COLONOSCOPY WITH PROPOFOL;  Surgeon: Lanelle Bal, DO;  [facility]: [facility];  Service: Endoscopy;  Laterality: N/A;  10:30 / ASA II   POLYPECTOMY  09/11/2021   Procedure: POLYPECTOMY;  Surgeon: Lanelle Bal, DO;  [facility]: [facility];  Service: Endoscopy;;   TOTAL ABDOMINAL HYSTERECTOMY     prolapse   Review of Systems  Constitutional:  + Steady weight since 2021     - fatigue, no subjective hyperthermia, no subjective hypothermia Eyes: no blurry vision, no xerophthalmia ENT: + Severe visual impairment due to corneal defect, no sore throat, no nodules palpated in  throat, no dysphagia/odynophagia, no hoarseness   Objective:    BP 134/78   Pulse 80   Ht 5\' 7"  (1.702 m)   Wt 193 lb 6.4 oz (87.7 kg)   BMI 30.29 kg/m   Wt Readings from Last 3 Encounters:  04/18/23 193 lb 6.4 oz (87.7 kg)  02/13/23 200 lb 1.3 oz (90.8 kg)  12/06/22 198 lb (89.8 kg)    Physical Exam  Constitutional:  + Obese, not in acute distress, normal state of mind.  uses her walking cane due to visual impairment.   CMP ( most recent) CMP     Component Value Date/Time   NA 142 04/04/2023 0912   K 4.3 04/04/2023 0912   CL 105 04/04/2023 0912   CO2 20 04/04/2023 0912   GLUCOSE 96 04/04/2023 0912   GLUCOSE 97 09/07/2021 0918   BUN 22 04/04/2023 0912   CREATININE 0.79 04/04/2023 0912   CREATININE 0.96 (H) 06/19/2020 1218   CALCIUM 9.1 04/04/2023 0912   PROT 6.7 04/04/2023 0912   ALBUMIN 3.9 04/04/2023 0912   AST 19 04/04/2023 0912   ALT 19 04/04/2023 0912   ALKPHOS 49 04/04/2023 0912   BILITOT 0.2 04/04/2023 0912   GFRNONAA >60 09/07/2021 0918   GFRNONAA 60 06/19/2020 1218   GFRAA 69 06/19/2020 1218     Diabetic Labs (most recent): Lab Results  Component Value Date   HGBA1C 5.8 04/18/2023   HGBA1C 5.8 04/17/2022   HGBA1C 5.7 04/17/2021     Lipid Panel ( most recent) Lipid Panel     Component Value Date/Time   CHOL 166 04/04/2023 0919   TRIG 89 04/04/2023 0919   HDL 57 04/04/2023 0919   CHOLHDL 2.9 04/04/2023 0919   CHOLHDL 3.3 06/19/2020 1218   VLDL 13 04/21/2017 0846   LDLCALC 93 04/04/2023 0919   LDLCALC 110 (H) 06/19/2020 1218     Last ultrasound of the thyroid from 05/02/2017 showed right lobe 4.2 cm previously 4.2 cm, left lobe 3.2 cm previously 4.3 cm. There is a 1 cm nodule on the lower part of the left lobe of the thyroid. This nodule was  reported to be stable from prior studies.  Recent Results (from the past 2160 hour(s))  Lipid panel     Status: None   Collection Time: 04/04/23  9:12 AM  Result Value Ref Range   Cholesterol, Total 163 100 - 199 mg/dL   Triglycerides 85 0 - 149 mg/dL   HDL 58 >16 mg/dL   VLDL Cholesterol Cal 16 5 - 40 mg/dL   LDL Chol Calc (NIH) 89 0 - 99 mg/dL   Chol/HDL Ratio 2.8 0.0 - 4.4 ratio    Comment:                                   T. Chol/HDL Ratio                                             Men  Women                               1/2 Avg.Risk  3.4    3.3  Avg.Risk  5.0    4.4                                2X Avg.Risk  9.6    7.1                                3X Avg.Risk 23.4   11.0   CMP14+EGFR     Status: None   Collection Time: 04/04/23  9:12 AM  Result Value Ref Range   Glucose 96 70 - 99 mg/dL   BUN 22 8 - 27 mg/dL   Creatinine, Ser 1.91 0.57 - 1.00 mg/dL   eGFR 79 >47 WG/NFA/2.13   BUN/Creatinine Ratio 28 12 - 28   Sodium 142 134 - 144 mmol/L   Potassium 4.3 3.5 - 5.2 mmol/L   Chloride 105 96 - 106 mmol/L   CO2 20 20 - 29 mmol/L   Calcium 9.1 8.7 - 10.3 mg/dL   Total Protein 6.7 6.0 - 8.5 g/dL   Albumin 3.9 3.8 - 4.8 g/dL   Globulin, Total 2.8 1.5 - 4.5 g/dL   Albumin/Globulin Ratio 1.4 1.2 - 2.2   Bilirubin Total 0.2 0.0 - 1.2 mg/dL   Alkaline Phosphatase 49 44 - 121 IU/L   AST 19 0 - 40 IU/L   ALT 19 0 - 32 IU/L  Lipid panel     Status: None   Collection Time: 04/04/23  9:19 AM  Result Value Ref Range   Cholesterol, Total 166 100 - 199 mg/dL   Triglycerides 89 0 - 149 mg/dL   HDL 57 >08 mg/dL   VLDL Cholesterol Cal 16 5 - 40 mg/dL   LDL Chol Calc (NIH) 93 0 - 99 mg/dL   Chol/HDL Ratio 2.9 0.0 - 4.4 ratio    Comment:                                   T. Chol/HDL Ratio                                             Men  Women                               1/2 Avg.Risk  3.4    3.3                                   Avg.Risk  5.0     4.4                                2X Avg.Risk  9.6    7.1                                3X Avg.Risk 23.4   11.0   TSH     Status: None   Collection Time: 04/04/23  9:19 AM  Result Value Ref Range   TSH 0.994 0.450 - 4.500  uIU/mL  T4, free     Status: None   Collection Time: 04/04/23  9:19 AM  Result Value Ref Range   Free T4 1.37 0.82 - 1.77 ng/dL  HgB Z6X     Status: None   Collection Time: 04/18/23  9:05 AM  Result Value Ref Range   Hemoglobin A1C     HbA1c POC (<> result, manual entry)     HbA1c, POC (prediabetic range)     HbA1c, POC (controlled diabetic range) 5.8 0.0 - 7.0 %    Thyroid ultrasound on October 11, 2020 No abnormal lymph nodes identified.   IMPRESSION: Stable thyroid ultrasound. Stable 1.0 cm left inferior thyroid nodule which does not meet criteria for biopsy or dedicated follow-up. This nodule appears stable since 2015 and is therefore likely benign.   Assessment & Plan:   1. Hypothyroidism due to Hashimoto's thyroiditis  -Her previsit thyroid function tests are consistent with appropriate replacement.  She is advised to continue levothyroxine 100 mcg p.o. daily before breakfast.     - We discussed about the correct intake of her thyroid hormone, on empty stomach at fasting, with water, separated by at least 30 minutes from breakfast and other medications,  and separated by more than 4 hours from calcium, iron, multivitamins, acid reflux medications (PPIs). -Patient is made aware of the fact that thyroid hormone replacement is needed for life, dose to be adjusted by periodic monitoring of thyroid function tests.     2. Nodular goiter-  - In the EMR records, she has a series of thyroid/neck sonograms,  showing stable solitary left lobe nodule at 1 cm in maximum dimension with no suspicious features.  Her most recent surveillance thyroid ultrasound is showing the same findings with no change.  She has a documented stability with benign features since  2015.  She will not require intervention at this time.    3.  Prediabetes -Her point-of-care A1c is still stable at 5.8%.  She is not on any medications for prediabetes/diabetes.      - she acknowledges that there is a room for improvement in her food and drink choices. - Suggestion is made for her to avoid simple carbohydrates  from her diet including Cakes, Sweet Desserts, Ice Cream, Soda (diet and regular), Sweet Tea, Candies, Chips, Cookies, Store Bought Juices, Alcohol in Excess of  1-2 drinks a day, Artificial Sweeteners,  Coffee Creamer, and "Sugar-free" Products, Lemonade. This will help patient to have more stable blood glucose profile and potentially avoid unintended weight gain.  4.  dyslipidemia: She presents with lipid panel showing LDL at 93, largely unchanged from her last measurement of 99.  She is advised to continue pravastatin 40 mg p.o. nightly.  Side effects and precautions discussed with her.    She is advised to introduce more whole food plant-based diet.  - I advised patient to maintain close follow up with Kerri Perches, MD for primary care needs.   I spent  25  minutes in the care of the patient today including review of labs from Thyroid Function, CMP, and other relevant labs ; imaging/biopsy records (current and previous including abstractions from other facilities); face-to-face time discussing  her lab results and symptoms, medications doses, her options of short and long term treatment based on the latest standards of care / guidelines;   and documenting the encounter.  Cyndra V Belter  participated in the discussions, expressed understanding, and voiced agreement with the above plans.  All questions were  answered to her satisfaction. she is encouraged to contact clinic should she have any questions or concerns prior to her return visit.    Follow up plan: Return in about 6 months (around 10/19/2023) for Fasting Labs  in AM B4 8.  Marquis Lunch,  MD Phone: [phone]  Fax: [phone]  -  This note was partially dictated with voice recognition software. Similar sounding words can be transcribed inadequately or may not  be corrected upon review.  04/18/2023, 10:27 AM

## 2023-05-12 ENCOUNTER — Other Ambulatory Visit: Payer: Self-pay | Admitting: "Endocrinology

## 2023-05-12 ENCOUNTER — Other Ambulatory Visit: Payer: Self-pay | Admitting: Family Medicine

## 2023-05-21 ENCOUNTER — Other Ambulatory Visit: Payer: Self-pay | Admitting: Family Medicine

## 2023-05-21 ENCOUNTER — Other Ambulatory Visit: Payer: Self-pay | Admitting: Internal Medicine

## 2023-05-21 DIAGNOSIS — J309 Allergic rhinitis, unspecified: Secondary | ICD-10-CM

## 2023-06-10 ENCOUNTER — Other Ambulatory Visit: Payer: Self-pay | Admitting: Family Medicine

## 2023-06-11 ENCOUNTER — Other Ambulatory Visit: Payer: Self-pay | Admitting: Family Medicine

## 2023-06-16 ENCOUNTER — Other Ambulatory Visit: Payer: Self-pay

## 2023-06-16 MED ORDER — PAROXETINE HCL 40 MG PO TABS: ORAL_TABLET | ORAL | 0 refills | Status: DC

## 2023-07-01 ENCOUNTER — Telehealth: Payer: Self-pay | Admitting: Family Medicine

## 2023-07-01 NOTE — Telephone Encounter (Signed)
Received Friday 7/19. Unable to chart due to outage.  PCS  Noted  Copied Sleeved  Original in PCP box 06/27/23 Copy front desk folder

## 2023-07-08 NOTE — Telephone Encounter (Signed)
Faxed with confirmation 7/29 No fee applied

## 2023-07-09 ENCOUNTER — Other Ambulatory Visit: Payer: Self-pay | Admitting: Family Medicine

## 2023-07-23 ENCOUNTER — Other Ambulatory Visit: Payer: Self-pay | Admitting: Internal Medicine

## 2023-07-23 DIAGNOSIS — J309 Allergic rhinitis, unspecified: Secondary | ICD-10-CM

## 2023-07-25 ENCOUNTER — Ambulatory Visit (HOSPITAL_COMMUNITY)
Admission: RE | Admit: 2023-07-25 | Discharge: 2023-07-25 | Disposition: A | Discharge status: Home / Self Care | Payer: Medicare Other | Source: Ambulatory Visit | Attending: Family Medicine | Admitting: Family Medicine

## 2023-07-25 ENCOUNTER — Encounter (HOSPITAL_COMMUNITY): Payer: Self-pay

## 2023-07-25 DIAGNOSIS — Z1231 Encounter for screening mammogram for malignant neoplasm of breast: Secondary | ICD-10-CM | POA: Diagnosis not present

## 2023-07-26 ENCOUNTER — Other Ambulatory Visit: Payer: Self-pay | Admitting: Family Medicine

## 2023-07-30 ENCOUNTER — Ambulatory Visit (INDEPENDENT_AMBULATORY_CARE_PROVIDER_SITE_OTHER): Payer: Medicare Other

## 2023-07-30 VITALS — Ht 66.5 in | Wt 194.0 lb

## 2023-07-30 DIAGNOSIS — Z780 Asymptomatic menopausal state: Secondary | ICD-10-CM

## 2023-07-30 DIAGNOSIS — Z0000 Encounter for general adult medical examination without abnormal findings: Secondary | ICD-10-CM | POA: Diagnosis not present

## 2023-07-30 NOTE — Progress Notes (Signed)
 Because this visit was a virtual/telehealth visit,  certain criteria was not obtained, such a blood pressure, CBG if patient is a diabetic, and timed get up and go. Any medications not marked as "taking" was not mentioned during the medication reconciliation part of the visit. Any vitals not documented were not able to be obtained due to this being a telehealth visit. Vitals that have been documented are verbally provided by the patient.  Patient was unable to self-report a recent blood pressure reading due to a lack of equipment at home via telehealth.  Subjective:   [name] is a 72 y.o. female who presents for Medicare Annual (Subsequent) preventive examination.  Visit Complete: Virtual  I connected with  [name] on 07/30/23 by a audio enabled telemedicine application and verified that I am speaking with the correct person using two identifiers.  Patient Location: Home  Provider Location: Home Office  I discussed the limitations of evaluation and management by telemedicine. The patient expressed understanding and agreed to proceed.  Patient Medicare AWV questionnaire was completed by the patient on n/a; I have confirmed that all information answered by patient is correct and no changes since this date.  Review of Systems     Cardiac Risk Factors include: advanced age (>58men, >31 women);dyslipidemia;hypertension;obesity (BMI >30kg/m2)     Objective:    Today's Vitals   07/30/23 1333  Weight: 194 lb (88 kg)  Height: 5' 6.5" (1.689 m)   Body mass index is 30.84 kg/m.     07/30/2023    1:33 PM 07/03/2022    1:31 PM 09/11/2021    9:02 AM 06/13/2021    1:19 PM 04/20/2018    1:15 PM 12/30/2016    9:57 AM 03/26/2015    2:31 PM  Advanced Directives  Does Patient Have a Medical Advance Directive? No No No No No No No  Would patient like information on creating a medical advance directive? No - Patient declined No - Patient declined No - Patient declined No - Patient  declined Yes (ED - Information included in AVS) Yes (MAU/Ambulatory/Procedural Areas - Information given) No - patient declined information    Current Medications (verified) Outpatient Encounter Medications as of 07/30/2023  Medication Sig   albuterol (VENTOLIN HFA) 108 (90 Base) MCG/ACT inhaler INHALE 2 PUFFS INTO THE LUNGS EVERY 4 HOURS AS NEEDED FOR COUGHNG ANDWHEEZING.   alendronate (FOSAMAX) 70 MG tablet TAKE ONE TABLET BY MOUTH ON AN EMPTY STOMACH WITH A GLASS FULL OF WATER ONCE A WEEK   amLODipine (NORVASC) 10 MG tablet TAKE 1 TABLET BY MOUTH AT BEDTIME.   calcium-vitamin D (OSCAL-500) 500-400 MG-UNIT tablet Take 1 tablet by mouth 2 (two) times daily.   DULERA 100-5 MCG/ACT AERO INHALE 2 PUFFS INTO THE LUNGS TWICE DAILY.   levothyroxine (SYNTHROID) 100 MCG tablet TAKE (1) TABLET BY MOUTH ONCE DAILY.   lovastatin (MEVACOR) 40 MG tablet TAKE (1) TABLET BY MOUTH AT BEDTIME.   PARoxetine (PAXIL) 20 MG tablet TAKE ONE TABLET BY MOUTH DAILY.   PARoxetine (PAXIL) 40 MG tablet TAKE (1) TABLET BY MOUTH EACH MORNING.   triamterene-hydrochlorothiazide (MAXZIDE-25) 37.5-25 MG tablet TAKE 1/2 TABLET BY MOUTH ONCE DAILY.   acetaminophen (TYLENOL) 650 MG CR tablet Take 650 mg by mouth as needed for pain.   betamethasone dipropionate 0.05 % cream Apply topically 2 (two) times daily. Apply twice daily to rash on left elbow for 7 days, then as needed (Patient taking differently: Apply topically as needed. Apply twice daily  to rash on left elbow for 7 days, then as needed)   cetirizine (ZYRTEC) 10 MG tablet TAKE (1) TABLET BY MOUTH DAILY.   Cholecalciferol (VITAMIN D3) 50 MCG (2000 UT) capsule Take 2,000 Units by mouth daily.   clotrimazole-betamethasone (LOTRISONE) cream Apply twice daily to rash on upper back for 10 days, then as needed   fluticasone (FLONASE) 50 MCG/ACT nasal spray PLACE 1 SPRAY INTO EACH NOSTRIL DAILY.   montelukast (SINGULAIR) 10 MG tablet TAKE (1) TABLET BY MOUTH AT BEDTIME.    polyethylene glycol powder (GLYCOLAX/MIRALAX) 17 GM/SCOOP powder Take 17 g by mouth 2 (two) times daily as needed.   No facility-administered encounter medications on file as of 07/30/2023.    Allergies (verified) Lotensin hct [benazepril-hydrochlorothiazide]   History: Past Medical History:  Diagnosis Date   Allergy    Anxiety    Arthritis    Asthma    Hyperlipidemia    Hypertension    Vision abnormalities    states close to blindness due to cataracts   Past Surgical History:  Procedure Laterality Date   caesarean     cataracts     CESAREAN SECTION     COLONOSCOPY WITH PROPOFOL N/A 09/11/2021   Procedure: COLONOSCOPY WITH PROPOFOL;  Surgeon: Lanelle Bal, DO;  Location: [facility];  Service: Endoscopy;  Laterality: N/A;  10:30 / ASA II   POLYPECTOMY  09/11/2021   Procedure: POLYPECTOMY;  Surgeon: Lanelle Bal, DO;  Location: [facility];  Service: Endoscopy;;   TOTAL ABDOMINAL HYSTERECTOMY     prolapse   Family History  Problem Relation Age of Onset   Cancer Mother 79       stomach   Kidney disease Mother    Hypertension Mother    Diabetes Father    Cancer Father        lung    Diabetes Sister    Hyperlipidemia Sister    Diabetes Sister    Diabetes Sister    Cancer Daughter 29       cervix   Alcohol abuse Brother    Diabetes Brother    Diabetes Brother    Diabetes Brother    Diabetes Brother    Arthritis Other    Asthma Other    Breast cancer Neg Hx    Social History   Socioeconomic History   Marital status: Single    Spouse name: Not on file   Number of children: Not on file   Years of education: 12th grade   Highest education level: Not on file  Occupational History   Occupation: disabled    Employer: NOT EMPLOYED  Tobacco Use   Smoking status: Former    Current packs/day: 0.00    Average packs/day: 0.5 packs/day for 18.0 years (9.0 ttl pk-yrs)    Types: Cigarettes    Start date: 22    Quit date: 1989    Years since quitting:  35.6   Smokeless tobacco: Never  Vaping Use   Vaping status: Never Used  Substance and Sexual Activity   Alcohol use: No   Drug use: No   Sexual activity: Not Currently  Other Topics Concern   Not on file  Social History Narrative   Not on file   Social Determinants of Health   Financial Resource Strain: Low Risk  (07/30/2023)   Overall Financial Resource Strain (CARDIA)    Difficulty of Paying Living Expenses: Not hard at all  Food Insecurity: No Food Insecurity (07/30/2023)   Hunger  Vital Sign    Worried About Programme researcher, broadcasting/film/video in the Last Year: Never true    Ran Out of Food in the Last Year: Never true  Transportation Needs: No Transportation Needs (07/30/2023)   PRAPARE - Administrator, Civil Service (Medical): No    Lack of Transportation (Non-Medical): No  Physical Activity: Sufficiently Active (07/30/2023)   Exercise Vital Sign    Days of Exercise per Week: 7 days    Minutes of Exercise per Session: 30 min  Stress: No Stress Concern Present (07/30/2023)   [employer] of Occupational Health - Occupational Stress Questionnaire    Feeling of Stress : Not at all  Social Connections: Socially Isolated (07/30/2023)   Social Connection and Isolation Panel [NHANES]    Frequency of Communication with Friends and Family: More than three times a week    Frequency of Social Gatherings with Friends and Family: More than three times a week    Attends Religious Services: Never    Database administrator or Organizations: No    Attends Engineer, structural: Never    Marital Status: Never married    Tobacco Counseling Counseling given: Yes   Clinical Intake:  Pre-visit preparation completed: Yes  Pain : No/denies pain     BMI - recorded: 30.84 Nutritional Status: BMI > 30  Obese Nutritional Risks: None Diabetes: No  How often do you need to have someone help you when you read instructions, pamphlets, or other written materials from your doctor  or pharmacy?: 1 - Never  Interpreter Needed?: No  Information entered by ::  Johnie Stadel, CMA   Activities of Daily Living    07/30/2023    1:44 PM  In your present state of health, do you have any difficulty performing the following activities:  Hearing? 0  Vision? 0  Difficulty concentrating or making decisions? 0  Walking or climbing stairs? 1  Dressing or bathing? 0  Doing errands, shopping? 0  Preparing Food and eating ? N  Using the Toilet? N  In the past six months, have you accidently leaked urine? N  Do you have problems with loss of bowel control? N  Managing your Medications? N  Managing your Finances? N  Housekeeping or managing your Housekeeping? N    Patient Care Team: Kerri Perches, MD as PCP - General (Family Medicine) Newman Pies, MD as Consulting Physician (Otolaryngology) Vickki Hearing, MD as Consulting Physician (Orthopedic Surgery) Lanelle Bal, DO as Consulting Physician (Gastroenterology)  Indicate any recent Medical Services you may have received from other than [facility] providers in the past year (date may be approximate).     Assessment:   This is a routine wellness examination for Sania.  Hearing/Vision screen Hearing Screening - Comments:: Patient denies any hearing difficulties.   Vision Screening - Comments:: Wears rx glasses - up to date with routine eye exams with Dr. Charise Killian at [facility]  Dietary issues and exercise activities discussed:     Goals Addressed             This Visit's Progress    Patient Stated       Lose more weight        Depression Screen    07/30/2023    1:39 PM 02/13/2023   10:50 AM 12/06/2022    4:00 PM 10/01/2022   11:31 AM 07/03/2022    1:32 PM 07/03/2022    1:29 PM 04/23/2022   10:10 AM  PHQ 2/9 Scores  PHQ - 2 Score 1 5 4 1 1 1 1   PHQ- 9 Score 4 17 9         Fall Risk    07/30/2023    1:44 PM 02/13/2023   10:49 AM 12/06/2022    4:00 PM 10/01/2022   11:31 AM 07/03/2022    1:31 PM   Fall Risk   Falls in the past year? 0 0 0 0 0  Number falls in past yr: 0 0 0 0 0  Injury with Fall? 0 0 0 0 0  Risk for fall due to : No Fall Risks No Fall Risks No Fall Risks No Fall Risks No Fall Risks  Follow up Falls prevention discussed Falls evaluation completed Falls evaluation completed Falls evaluation completed Falls evaluation completed    MEDICARE RISK AT HOME: Medicare Risk at Home Any stairs in or around the home?: No If so, are there any without handrails?: No Home free of loose throw rugs in walkways, pet beds, electrical cords, etc?: Yes Adequate lighting in your home to reduce risk of falls?: Yes Life alert?: No Use of a cane, walker or w/c?: Yes Grab bars in the bathroom?: Yes Shower chair or bench in shower?: No Elevated toilet seat or a handicapped toilet?: No  TIMED UP AND GO:  Was the test performed?  No    Cognitive Function:    07/03/2022    1:33 PM  MMSE - Mini Mental State Exam  Not completed: Unable to complete        07/30/2023    1:36 PM 07/03/2022    1:33 PM 04/22/2019   10:29 AM 04/20/2018    1:17 PM 12/30/2016   10:08 AM  6CIT Screen  What Year? 0 points 0 points 0 points 0 points 0 points  What month? 0 points 0 points 0 points 0 points 0 points  What time? 0 points 0 points 0 points  0 points  Count back from 20 0 points 0 points 0 points 0 points 0 points  Months in reverse 0 points 2 points 0 points 0 points 0 points  Repeat phrase 0 points 0 points 0 points 0 points 0 points  Total Score 0 points 2 points 0 points  0 points    Immunizations Immunization History  Administered Date(s) Administered   Fluad Quad(high Dose 65+) 10/06/2019, 08/18/2020, 08/14/2021, 10/01/2022   H1N1 11/15/2008   Influenza Split 08/30/2011, 08/14/2012   Influenza Whole 09/15/2009, 09/26/2010   Influenza,inj,Quad PF,6+ Mos 10/18/2013, 08/25/2014, 12/13/2015, 08/05/2016, 09/22/2017, 07/28/2018   Moderna SARS-COV2 Booster Vaccination 08/25/2021    Moderna Sars-Covid-2 Vaccination 01/16/2020, 02/16/2020, 11/10/2020, 04/16/2021, 08/30/2022   Pneumococcal Conjugate-13 05/03/2015   Pneumococcal Polysaccharide-23 08/05/2016   Td 03/02/2004   Tdap 12/12/2011, 05/12/2023   Zoster Recombinant(Shingrix) 09/13/2019, 11/15/2019    TDAP status: Up to date  Flu Vaccine status: Due, Education has been provided regarding the importance of this vaccine. Advised may receive this vaccine at local pharmacy or Health Dept. Aware to provide a copy of the vaccination record if obtained from local pharmacy or Health Dept. Verbalized acceptance and understanding.  Pneumococcal vaccine status: Up to date  Covid-19 vaccine status: Information provided on how to obtain vaccines.   Qualifies for Shingles Vaccine? No   Zostavax completed Yes   Shingrix Completed?: Yes  Screening Tests Health Maintenance  Topic Date Due   COVID-19 Vaccine (7 - 2023-24 season) 10/25/2022   Medicare Annual Wellness (AWV)  07/04/2023   INFLUENZA  VACCINE  07/10/2023   MAMMOGRAM  07/24/2024   Colonoscopy  09/12/2031   DTaP/Tdap/Td (4 - Td or Tdap) 05/11/2033   Pneumonia Vaccine 47+ Years old  Completed   DEXA SCAN  Completed   Hepatitis C Screening  Completed   Zoster Vaccines- Shingrix  Completed   HPV VACCINES  Aged Out    Health Maintenance  Health Maintenance Due  Topic Date Due   COVID-19 Vaccine (7 - 2023-24 season) 10/25/2022   Medicare Annual Wellness (AWV)  07/04/2023   INFLUENZA VACCINE  07/10/2023    Colorectal cancer screening: Type of screening: Colonoscopy. Completed 09/11/2021. Repeat every 3 years  Mammogram status: Completed 07/25/2023. Repeat every year  Bone Density status: Ordered 07/30/2023. Pt provided with contact info and advised to call to schedule appt.  Lung Cancer Screening: (Low Dose CT Chest recommended if Age 39-80 years, 20 pack-year currently smoking OR have quit w/in 15years.) does not qualify.   Lung Cancer Screening Referral:  na  Additional Screening:  Hepatitis C Screening: does not qualify; Completed 12/13/2015  Vision Screening: Recommended annual ophthalmology exams for early detection of glaucoma and other disorders of the eye. Is the patient up to date with their annual eye exam?  Yes  Who is the provider or what is the name of the office in which the patient attends annual eye exams? Dr. Charise Killian, [facility] If pt is not established with a provider, would they like to be referred to a provider to establish care? No .   Dental Screening: Recommended annual dental exams for proper oral hygiene  Diabetic Foot Exam: n/a  Community Resource Referral / Chronic Care Management: CRR required this visit?  No   CCM required this visit?  No     Plan:     I have personally reviewed and noted the following in the patient's chart:   Medical and social history Use of alcohol, tobacco or illicit drugs  Current medications and supplements including opioid prescriptions. Patient is not currently taking opioid prescriptions. Functional ability and status Nutritional status Physical activity Advanced directives List of other physicians Hospitalizations, surgeries, and ER visits in previous 12 months Vitals Screenings to include cognitive, depression, and falls Referrals and appointments  In addition, I have reviewed and discussed with patient certain preventive protocols, quality metrics, and best practice recommendations. A written personalized care plan for preventive services as well as general preventive health recommendations were provided to patient.     Jordan Hawks Janyah Singleterry, CMA   07/30/2023   After Visit Summary: (Mail) Due to this being a telephonic visit, the after visit summary with patients personalized plan was offered to patient via mail   Nurse Notes: Dexa Scan ordered for patient today

## 2023-07-30 NOTE — Patient Instructions (Signed)
[name] , Thank you for taking time to come for your Medicare Wellness Visit. I appreciate your ongoing commitment to your health goals. Please review the following plan we discussed and let me know if I can assist you in the future.   Referrals/Orders/Follow-Ups/Clinician Recommendations:   This is a list of the screening recommended for you and due dates:  Health Maintenance  Topic Date Due   COVID-19 Vaccine (7 - 2023-24 season) 10/25/2022   Flu Shot  07/10/2023   Mammogram  07/24/2024   Medicare Annual Wellness Visit  07/29/2024   Colon Cancer Screening  09/12/2031   DTaP/Tdap/Td vaccine (4 - Td or Tdap) 05/11/2033   Pneumonia Vaccine  Completed   DEXA scan (bone density measurement)  Completed   Hepatitis C Screening  Completed   Zoster (Shingles) Vaccine  Completed   HPV Vaccine  Aged Out    Advanced directives: (Declined) Advance directive discussed with you today. Even though you declined this today, please call our office should you change your mind, and we can give you the proper paperwork for you to fill out.  Next Medicare Annual Wellness Visit scheduled for next year: Yes September 22, 2024 at 2:30pm telephone visit  Preventive Care 73 Years and Older, Female Preventive care refers to lifestyle choices and visits with your health care provider that can promote health and wellness. Preventive care visits are also called wellness exams. What can I expect for my preventive care visit? Counseling Your health care provider may ask you questions about your: Medical history, including: Past medical problems. Family medical history. Pregnancy and menstrual history. History of falls. Current health, including: Memory and ability to understand (cognition). Emotional well-being. Home life and relationship well-being. Sexual activity and sexual health. Lifestyle, including: Alcohol, nicotine or tobacco, and drug use. Access to firearms. Diet, exercise, and sleep  habits. Work and work Astronomer. Sunscreen use. Safety issues such as seatbelt and bike helmet use. Physical exam Your health care provider will check your: Height and weight. These may be used to calculate your BMI (body mass index). BMI is a measurement that tells if you are at a healthy weight. Waist circumference. This measures the distance around your waistline. This measurement also tells if you are at a healthy weight and may help predict your risk of certain diseases, such as type 2 diabetes and high blood pressure. Heart rate and blood pressure. Body temperature. Skin for abnormal spots. What immunizations do I need?  Vaccines are usually given at various ages, according to a schedule. Your health care provider will recommend vaccines for you based on your age, medical history, and lifestyle or other factors, such as travel or where you work. What tests do I need? Screening Your health care provider may recommend screening tests for certain conditions. This may include: Lipid and cholesterol levels. Hepatitis C test. Hepatitis B test. HIV (human immunodeficiency virus) test. STI (sexually transmitted infection) testing, if you are at risk. Lung cancer screening. Colorectal cancer screening. Diabetes screening. This is done by checking your blood sugar (glucose) after you have not eaten for a while (fasting). Mammogram. Talk with your health care provider about how often you should have regular mammograms. BRCA-related cancer screening. This may be done if you have a family history of breast, ovarian, tubal, or peritoneal cancers. Bone density scan. This is done to screen for osteoporosis. Talk with your health care provider about your test results, treatment options, and if necessary, the need for more tests. Follow these  instructions at home: Eating and drinking  Eat a diet that includes fresh fruits and vegetables, whole grains, lean protein, and low-fat dairy products.  Limit your intake of foods with high amounts of sugar, saturated fats, and salt. Take vitamin and mineral supplements as recommended by your health care provider. Do not drink alcohol if your health care provider tells you not to drink. If you drink alcohol: Limit how much you have to 0-1 drink a day. Know how much alcohol is in your drink. In the [place] one drink equals one 12 oz bottle of beer (355 mL), one 5 oz glass of wine (148 mL), or one 1 oz glass of hard liquor (44 mL). Lifestyle Brush your teeth every morning and night with fluoride toothpaste. Floss one time each day. Exercise for at least 30 minutes 5 or more days each week. Do not use any products that contain nicotine or tobacco. These products include cigarettes, chewing tobacco, and vaping devices, such as e-cigarettes. If you need help quitting, ask your health care provider. Do not use drugs. If you are sexually active, practice safe sex. Use a condom or other form of protection in order to prevent STIs. Take aspirin only as told by your health care provider. Make sure that you understand how much to take and what form to take. Work with your health care provider to find out whether it is safe and beneficial for you to take aspirin daily. Ask your health care provider if you need to take a cholesterol-lowering medicine (statin). Find healthy ways to manage stress, such as: Meditation, yoga, or listening to music. Journaling. Talking to a trusted person. Spending time with friends and family. Minimize exposure to UV radiation to reduce your risk of skin cancer. Safety Always wear your seat belt while driving or riding in a vehicle. Do not drive: If you have been drinking alcohol. Do not ride with someone who has been drinking. When you are tired or distracted. While texting. If you have been using any mind-altering substances or drugs. Wear a helmet and other protective equipment during sports activities. If you have  firearms in your house, make sure you follow all gun safety procedures. What's next? Visit your health care provider once a year for an annual wellness visit. Ask your health care provider how often you should have your eyes and teeth checked. Stay up to date on all vaccines. This information is not intended to replace advice given to you by your health care provider. Make sure you discuss any questions you have with your health care provider. Document Revised: 05/23/2021 Document Reviewed: 05/23/2021 Elsevier Patient Education  2024 [employer]. Understanding Your Risk for Falls Millions of people have serious injuries from falls each year. It is important to understand your risk of falling. Talk with your health care provider about your risk and what you can do to lower it. If you do have a serious fall, make sure to tell your provider. Falling once raises your risk of falling again. How can falls affect me? Serious injuries from falls are common. These include: Broken bones, such as hip fractures. Head injuries, such as traumatic brain injuries (TBI) or concussions. A fear of falling can cause you to avoid activities and stay at home. This can make your muscles weaker and raise your risk for a fall. What can increase my risk? There are a number of risk factors that increase your risk for falling. The more risk factors you have, the higher your  risk of falling. Serious injuries from a fall happen most often to people who are older than 73 years old. Teenagers and young adults ages 29-29 are also at higher risk. Common risk factors include: Weakness in the lower body. Being generally weak or confused due to long-term (chronic) illness. Dizziness or balance problems. Poor vision. Medicines that cause dizziness or drowsiness. These may include: Medicines for your blood pressure, heart, anxiety, insomnia, or swelling (edema). Pain medicines. Muscle relaxants. Other risk factors  include: Drinking alcohol. Having had a fall in the past. Having foot pain or wearing improper footwear. Working at a dangerous job. Having any of the following in your home: Tripping hazards, such as floor clutter or loose rugs. Poor lighting. Pets. Having dementia or memory loss. What actions can I take to lower my risk of falling?     Physical activity Stay physically fit. Do strength and balance exercises. Consider taking a regular class to build strength and balance. Yoga and tai chi are good options. Vision Have your eyes checked every year and your prescription for glasses or contacts updated as needed. Shoes and walking aids Wear non-skid shoes. Wear shoes that have rubber soles and low heels. Do not wear high heels. Do not walk around the house in socks or slippers. Use a cane or walker as told by your provider. Home safety Attach secure railings on both sides of your stairs. Install grab bars for your bathtub, shower, and toilet. Use a non-skid mat in your bathtub or shower. Attach bath mats securely with double-sided, non-slip rug tape. Use good lighting in all rooms. Keep a flashlight near your bed. Make sure there is a clear path from your bed to the bathroom. Use night-lights. Do not use throw rugs. Make sure all carpeting is taped or tacked down securely. Remove all clutter from walkways and stairways, including extension cords. Repair uneven or broken steps and floors. Avoid walking on icy or slippery surfaces. Walk on the grass instead of on icy or slick sidewalks. Use ice melter to get rid of ice on walkways in the winter. Use a cordless phone. Questions to ask your health care provider Can you help me check my risk for a fall? Do any of my medicines make me more likely to fall? Should I take a vitamin D supplement? What exercises can I do to improve my strength and balance? Should I make an appointment to have my vision checked? Do I need a bone density test  to check for weak bones (osteoporosis)? Would it help to use a cane or a walker? Where to find more information Centers for Disease Control and Prevention, [employer]: TonerPromos.no Community-Based Fall Prevention Programs: TonerPromos.no [employer] on Aging: BaseRingTones.pl Contact a health care provider if: You fall at home. You are afraid of falling at home. You feel weak, drowsy, or dizzy. This information is not intended to replace advice given to you by your health care provider. Make sure you discuss any questions you have with your health care provider. Document Revised: 07/29/2022 Document Reviewed: 07/29/2022 Elsevier Patient Education  2024 [employer].

## 2023-08-08 ENCOUNTER — Other Ambulatory Visit: Payer: Self-pay | Admitting: Family Medicine

## 2023-08-27 ENCOUNTER — Ambulatory Visit: Payer: Medicare Other | Admitting: Family Medicine

## 2023-09-09 ENCOUNTER — Other Ambulatory Visit: Payer: Self-pay | Admitting: Family Medicine

## 2023-09-09 ENCOUNTER — Other Ambulatory Visit: Payer: Self-pay | Admitting: "Endocrinology

## 2023-10-07 ENCOUNTER — Other Ambulatory Visit: Payer: Self-pay | Admitting: Family Medicine

## 2023-10-08 ENCOUNTER — Other Ambulatory Visit: Payer: Self-pay | Admitting: Family Medicine

## 2023-10-09 ENCOUNTER — Ambulatory Visit (INDEPENDENT_AMBULATORY_CARE_PROVIDER_SITE_OTHER): Payer: Medicare Other | Admitting: Family Medicine

## 2023-10-09 ENCOUNTER — Encounter: Payer: Self-pay | Admitting: Family Medicine

## 2023-10-09 VITALS — BP 150/84 | HR 80 | Ht 67.0 in | Wt 189.1 lb

## 2023-10-09 DIAGNOSIS — M25551 Pain in right hip: Secondary | ICD-10-CM

## 2023-10-09 DIAGNOSIS — I10 Essential (primary) hypertension: Secondary | ICD-10-CM | POA: Diagnosis not present

## 2023-10-09 DIAGNOSIS — Z23 Encounter for immunization: Secondary | ICD-10-CM

## 2023-10-09 DIAGNOSIS — R159 Full incontinence of feces: Secondary | ICD-10-CM

## 2023-10-09 DIAGNOSIS — R32 Unspecified urinary incontinence: Secondary | ICD-10-CM

## 2023-10-09 DIAGNOSIS — M5410 Radiculopathy, site unspecified: Secondary | ICD-10-CM | POA: Diagnosis not present

## 2023-10-09 MED ORDER — GABAPENTIN 100 MG PO CAPS: 100.0000 mg | ORAL_CAPSULE | Freq: Every day | ORAL | 0 refills | Status: DC

## 2023-10-09 MED ORDER — METHYLPREDNISOLONE ACETATE 80 MG/ML IJ SUSP
80.0000 mg | Freq: Once | INTRAMUSCULAR | Status: AC
  Administered 2023-10-09: 80 mg via INTRAMUSCULAR

## 2023-10-09 MED ORDER — TRIAMTERENE-HCTZ 37.5-25 MG PO TABS: ORAL_TABLET | ORAL | 3 refills | Status: DC

## 2023-10-09 MED ORDER — DICLOFENAC SODIUM 1 % EX GEL: CUTANEOUS | 1 refills | Status: AC

## 2023-10-09 MED ORDER — PREDNISONE 10 MG (21) PO TBPK: ORAL_TABLET | ORAL | 0 refills | Status: DC

## 2023-10-09 NOTE — Patient Instructions (Addendum)
F/U in  2 months, call if you need me before Depo medrol 80 mg I'm in the office for pain  Prednisone 10 mg dose pack is prescribed for pain and bedtime gabapentin short term for pain and voltaren gel for pain  Blood pressure is high, increase triamterene to ONE daily  We will request raised toilet seat, and incontinence supplies, chux and depends,  Handicap sticker is signed  Careful not to fall  Thanks for choosing [facility], we consider it a privelige to serve you.

## 2023-10-13 DIAGNOSIS — R159 Full incontinence of feces: Secondary | ICD-10-CM | POA: Insufficient documentation

## 2023-10-13 DIAGNOSIS — Z23 Encounter for immunization: Secondary | ICD-10-CM | POA: Insufficient documentation

## 2023-10-13 NOTE — Assessment & Plan Note (Signed)
Increased and uncontrolled , progressively worsening , voltaren gel and gabapentin as maintainace. Need raised toilet seat

## 2023-10-13 NOTE — Assessment & Plan Note (Signed)
Uncontrolled , depo medrol 80 mg I'm in office followed by short course of prednisone, gabapentin and voltaren long term. Request raised toilet seat and handicap sticker permanenet

## 2023-10-13 NOTE — Assessment & Plan Note (Signed)
Marked limitation in mobility with nocturia, needs and will benefit from supplies, chux and depends for night time use not limited to , but especially when risk of fall great

## 2023-10-13 NOTE — Assessment & Plan Note (Signed)
Uncontrolled , inc dose of triamterene and re assess in 8 to 10 weeks

## 2023-10-13 NOTE — Assessment & Plan Note (Signed)
After obtaining informed consent, the vaccine is  administered , with no adverse effect noted at the time of administration.  

## 2023-11-01 LAB — COMPREHENSIVE METABOLIC PANEL
ALT: 16 [IU]/L (ref 0–32)
AST: 14 [IU]/L (ref 0–40)
Albumin: 3.9 g/dL (ref 3.8–4.8)
Alkaline Phosphatase: 57 [IU]/L (ref 44–121)
BUN/Creatinine Ratio: 31 — ABNORMAL HIGH (ref 12–28)
BUN: 27 mg/dL (ref 8–27)
Bilirubin Total: 0.3 mg/dL (ref 0.0–1.2)
CO2: 29 mmol/L (ref 20–29)
Calcium: 9.4 mg/dL (ref 8.7–10.3)
Chloride: 100 mmol/L (ref 96–106)
Creatinine, Ser: 0.87 mg/dL (ref 0.57–1.00)
Globulin, Total: 3.1 g/dL (ref 1.5–4.5)
Glucose: 86 mg/dL (ref 70–99)
Potassium: 5.1 mmol/L (ref 3.5–5.2)
Sodium: 143 mmol/L (ref 134–144)
Total Protein: 7 g/dL (ref 6.0–8.5)
eGFR: 70 mL/min/{1.73_m2} (ref >59)

## 2023-11-01 LAB — LIPID PANEL
Chol/HDL Ratio: 2.4 {ratio} (ref 0.0–4.4)
Cholesterol, Total: 192 mg/dL (ref 100–199)
HDL: 81 mg/dL (ref >39)
LDL Chol Calc (NIH): 92 mg/dL (ref 0–99)
Triglycerides: 109 mg/dL (ref 0–149)
VLDL Cholesterol Cal: 19 mg/dL (ref 5–40)

## 2023-11-01 LAB — TSH: TSH: 12.2 u[IU]/mL — ABNORMAL HIGH (ref 0.450–4.500)

## 2023-11-01 LAB — T4, FREE: Free T4: 1.35 ng/dL (ref 0.82–1.77)

## 2023-11-04 ENCOUNTER — Ambulatory Visit (INDEPENDENT_AMBULATORY_CARE_PROVIDER_SITE_OTHER): Payer: Medicare Other | Admitting: "Endocrinology

## 2023-11-04 ENCOUNTER — Other Ambulatory Visit: Payer: Self-pay | Admitting: Family Medicine

## 2023-11-04 ENCOUNTER — Encounter: Payer: Self-pay | Admitting: "Endocrinology

## 2023-11-04 VITALS — BP 134/84 | HR 64 | Ht 67.0 in | Wt 188.0 lb

## 2023-11-04 DIAGNOSIS — E039 Hypothyroidism, unspecified: Secondary | ICD-10-CM | POA: Diagnosis not present

## 2023-11-04 DIAGNOSIS — R7303 Prediabetes: Secondary | ICD-10-CM

## 2023-11-04 DIAGNOSIS — E782 Mixed hyperlipidemia: Secondary | ICD-10-CM

## 2023-11-04 MED ORDER — LEVOTHYROXINE SODIUM 112 MCG PO TABS: 112.0000 ug | ORAL_TABLET | Freq: Every day | ORAL | 1 refills | Status: DC

## 2023-11-04 NOTE — Progress Notes (Signed)
11/04/2023  Endocrinology follow-up note   Subjective:    Patient ID: [name], female    DOB: 19-Jan-1951, PCP Kerri Perches, MD   Past Medical History:  Diagnosis Date   Allergy    Anxiety    Arthritis    Asthma    Hyperlipidemia    Hypertension    Vision abnormalities    states close to blindness due to cataracts   Past Surgical History:  Procedure Laterality Date   caesarean     cataracts     CESAREAN SECTION     COLONOSCOPY WITH PROPOFOL N/A 09/11/2021   Procedure: COLONOSCOPY WITH PROPOFOL;  Surgeon: Lanelle Bal, DO;  [facility]: [facility];  Service: Endoscopy;  Laterality: N/A;  10:30 / ASA II   POLYPECTOMY  09/11/2021   Procedure: POLYPECTOMY;  Surgeon: Lanelle Bal, DO;  Location: [facility];  Service: Endoscopy;;   TOTAL ABDOMINAL HYSTERECTOMY     prolapse   Social History   Socioeconomic History   Marital status: Single    Spouse name: Not on file   Number of children: Not on file   Years of education: 12th grade   Highest education level: Not on file  Occupational History   Occupation: disabled    Employer: NOT EMPLOYED  Tobacco Use   Smoking status: Former    Current packs/day: 0.00    Average packs/day: 0.5 packs/day for 18.0 years (9.0 ttl pk-yrs)    Types: Cigarettes    Start date: 45    Quit date: 1989    Years since quitting: 35.9   Smokeless tobacco: Never  Vaping Use   Vaping status: Never Used  Substance and Sexual Activity   Alcohol use: No   Drug use: No   Sexual activity: Not Currently  Other Topics Concern   Not on file  Social History Narrative   Not on file   Social Determinants of Health   Financial Resource Strain: Low Risk  (07/30/2023)   Overall Financial Resource Strain (CARDIA)    Difficulty of Paying Living Expenses: Not hard at all  Food Insecurity: No Food Insecurity (07/30/2023)   Hunger Vital Sign    Worried About Running Out of Food in the Last Year: Never true    Ran Out of  Food in the Last Year: Never true  Transportation Needs: No Transportation Needs (07/30/2023)   PRAPARE - Administrator, Civil Service (Medical): No    Lack of Transportation (Non-Medical): No  Physical Activity: Sufficiently Active (07/30/2023)   Exercise Vital Sign    Days of Exercise per Week: 7 days    Minutes of Exercise per Session: 30 min  Stress: No Stress Concern Present (07/30/2023)   [name] [employer] Occupational Health - Occupational Stress Questionnaire    Feeling of Stress : Not at all  Social Connections: Socially Isolated (07/30/2023)   Social Connection and Isolation Panel [NHANES]    Frequency of Communication with Friends and Family: More than three times a week    Frequency of Social Gatherings with Friends and Family: More than three times a week    Attends Religious Services: Never    Database administrator or Organizations: No    Attends Banker Meetings: Never    Marital Status: Never married   Outpatient Encounter Medications as of 11/04/2023  Medication Sig   acetaminophen (TYLENOL) 650 MG CR tablet Take 650 mg by mouth as needed for pain.   albuterol (  VENTOLIN HFA) 108 (90 Base) MCG/ACT inhaler INHALE 2 PUFFS INTO THE LUNGS EVERY 4 HOURS AS NEEDED FOR COUGHNG ANDWHEEZING.   betamethasone dipropionate 0.05 % cream Apply topically 2 (two) times daily. Apply twice daily to rash on left elbow for 7 days, then as needed (Patient taking differently: Apply topically as needed. Apply twice daily to rash on left elbow for 7 days, then as needed)   calcium-vitamin D (OSCAL-500) 500-400 MG-UNIT tablet Take 1 tablet by mouth 2 (two) times daily.   Cholecalciferol (VITAMIN D3) 50 MCG (2000 UT) capsule Take 2,000 Units by mouth daily.   clotrimazole-betamethasone (LOTRISONE) cream Apply twice daily to rash on upper back for 10 days, then as needed   diclofenac Sodium (VOLTAREN) 1 % GEL Apply 2 gm once daily to affected areas   fluticasone (FLONASE)  50 MCG/ACT nasal spray PLACE 1 SPRAY INTO EACH NOSTRIL DAILY.   levothyroxine (SYNTHROID) 112 MCG tablet Take 1 tablet (112 mcg total) by mouth daily before breakfast.   PARoxetine (PAXIL) 20 MG tablet TAKE ONE TABLET BY MOUTH DAILY.   polyethylene glycol powder (GLYCOLAX/MIRALAX) 17 GM/SCOOP powder Take 17 g by mouth 2 (two) times daily as needed.   triamterene-hydrochlorothiazide (MAXZIDE-25) 37.5-25 MG tablet Take one tablet by mouth once daily for blood pressure   [DISCONTINUED] alendronate (FOSAMAX) 70 MG tablet TAKE ONE TABLET BY MOUTH ON AN EMPTY STOMACH WITH A GLASS FULL OF WATER ONCE A WEEK   [DISCONTINUED] amLODipine (NORVASC) 10 MG tablet TAKE 1 TABLET BY MOUTH AT BEDTIME.   [DISCONTINUED] cetirizine (ZYRTEC) 10 MG tablet TAKE (1) TABLET BY MOUTH DAILY.   [DISCONTINUED] DULERA 100-5 MCG/ACT AERO INHALE 2 PUFFS INTO THE LUNGS TWICE DAILY.   [DISCONTINUED] gabapentin (NEURONTIN) 100 MG capsule Take 1 capsule (100 mg total) by mouth at bedtime.   [DISCONTINUED] levothyroxine (SYNTHROID) 100 MCG tablet TAKE (1) TABLET BY MOUTH ONCE DAILY.   [DISCONTINUED] lovastatin (MEVACOR) 40 MG tablet TAKE (1) TABLET BY MOUTH AT BEDTIME.   [DISCONTINUED] montelukast (SINGULAIR) 10 MG tablet TAKE (1) TABLET BY MOUTH AT BEDTIME.   [DISCONTINUED] PARoxetine (PAXIL) 40 MG tablet TAKE (1) TABLET BY MOUTH EACH MORNING.   [DISCONTINUED] predniSONE (STERAPRED UNI-PAK 21 TAB) 10 MG (21) TBPK tablet Use as directed   No facility-administered encounter medications on file as of 11/04/2023.   ALLERGIES: Allergies  Allergen Reactions   Lotensin Hct [Benazepril-Hydrochlorothiazide] Hives    VACCINATION STATUS: Immunization History  Administered Date(s) Administered   Fluad Quad(high Dose 65+) 10/06/2019, 08/18/2020, 08/14/2021, 10/01/2022   Fluad Trivalent(High Dose 65+) 10/09/2023   H1N1 11/15/2008   Influenza Split 08/30/2011, 08/14/2012   Influenza Whole 09/15/2009, 09/26/2010   Influenza,inj,Quad  PF,6+ Mos 10/18/2013, 08/25/2014, 12/13/2015, 08/05/2016, 09/22/2017, 07/28/2018   Moderna SARS-COV2 Booster Vaccination 08/25/2021, 08/25/2023   Moderna Sars-Covid-2 Vaccination 01/16/2020, 02/16/2020, 11/10/2020, 04/16/2021, 08/30/2022   Pneumococcal Conjugate-13 05/03/2015   Pneumococcal Polysaccharide-23 08/05/2016   Td 03/02/2004   Tdap 12/12/2011, 05/12/2023   Zoster Recombinant(Shingrix) 09/13/2019, 11/15/2019    HPI 71 year old female patient with medical history as above.  She is returning for follow-up with repeat thyroid function test for follow-up of hypothyroidism, hyperlipidemia, prediabetes. She is currently on stable dose of levothyroxine 100 mcg p.o. daily before breakfast.      She reports compliance with medication.  She has no new complaints today.   -She has a steady weight.  Her previsit thyroid function tests are consistent with slight under replacement. - She is a poor historian accompanied by her sister.  - She  denies dysphagia, shortness of breath, nor voice change. She had  a series of thyroid ultrasound studies, last one from 05/02/2017 showing 1 cm solitary nodule on the left lobe of the thyroid .  Her most recent surveillance thyroid ultrasound in 2021 reveals the same finding, unchanged. - Ultrasound description of the thyroid summarized below. 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  Her point-of-care A1c remains at 5.8%, still consistent with prediabetes.   She is on pravastatin for hyperlipidemia.  Current Outpatient Medications:    acetaminophen (TYLENOL) 650 MG CR tablet, Take 650 mg by mouth as needed for pain., Disp: , Rfl:    albuterol (VENTOLIN HFA) 108 (90 Base) MCG/ACT inhaler, INHALE 2 PUFFS INTO THE LUNGS EVERY 4 HOURS AS NEEDED FOR COUGHNG ANDWHEEZING., Disp: 18 g, Rfl: 3   alendronate (FOSAMAX) 70 MG tablet, TAKE ONE TABLET BY MOUTH ON AN EMPTY STOMACH WITH A GLASS FULL OF WATER ONCE  A WEEK, Disp: 4 tablet, Rfl: 0   amLODipine (NORVASC) 10 MG tablet, TAKE 1 TABLET BY MOUTH AT BEDTIME., Disp: 30 tablet, Rfl: 0   betamethasone dipropionate 0.05 % cream, Apply topically 2 (two) times daily. Apply twice daily to rash on left elbow for 7 days, then as needed (Patient taking differently: Apply topically as needed. Apply twice daily to rash on left elbow for 7 days, then as needed), Disp: 45 g, Rfl: 0   calcium-vitamin D (OSCAL-500) 500-400 MG-UNIT tablet, Take 1 tablet by mouth 2 (two) times daily., Disp: 60 tablet, Rfl: 11   cetirizine (ZYRTEC) 10 MG tablet, TAKE (1) TABLET BY MOUTH DAILY., Disp: 30 tablet, Rfl: 0   Cholecalciferol (VITAMIN D3) 50 MCG (2000 UT) capsule, Take 2,000 Units by mouth daily., Disp: , Rfl:    clotrimazole-betamethasone (LOTRISONE) cream, Apply twice daily to rash on upper back for 10 days, then as needed, Disp: 45 g, Rfl: 1   diclofenac Sodium (VOLTAREN) 1 % GEL, Apply 2 gm once daily to affected areas, Disp: 300 g, Rfl: 1   DULERA 100-5 MCG/ACT AERO, INHALE 2 PUFFS INTO THE LUNGS TWICE DAILY., Disp: 13 g, Rfl: 0   fluticasone (FLONASE) 50 MCG/ACT nasal spray, PLACE 1 SPRAY INTO EACH NOSTRIL DAILY., Disp: 16 g, Rfl: 0   gabapentin (NEURONTIN) 100 MG capsule, Take 1 capsule (100 mg total) by mouth at bedtime., Disp: 10 capsule, Rfl: 0   levothyroxine (SYNTHROID) 112 MCG tablet, Take 1 tablet (112 mcg total) by mouth daily before breakfast., Disp: 90 tablet, Rfl: 1   lovastatin (MEVACOR) 40 MG tablet, TAKE (1) TABLET BY MOUTH AT BEDTIME., Disp: 30 tablet, Rfl: 0   montelukast (SINGULAIR) 10 MG tablet, TAKE (1) TABLET BY MOUTH AT BEDTIME., Disp: 30 tablet, Rfl: 0   PARoxetine (PAXIL) 20 MG tablet, TAKE ONE TABLET BY MOUTH DAILY., Disp: 30 tablet, Rfl: 0   PARoxetine (PAXIL) 40 MG tablet, TAKE (1) TABLET BY MOUTH EACH MORNING., Disp: 30 tablet, Rfl: 0   polyethylene glycol powder (GLYCOLAX/MIRALAX) 17 GM/SCOOP powder, Take 17 g by mouth 2 (two) times daily as  needed., Disp: 3350 g, Rfl: 1   predniSONE (STERAPRED UNI-PAK 21 TAB) 10 MG (21) TBPK tablet, USE AS DIRECTED, Disp: 21 tablet, Rfl: 0   triamterene-hydrochlorothiazide (MAXZIDE-25) 37.5-25 MG tablet, Take one tablet by mouth once daily for blood pressure, Disp: 90 tablet, Rfl: 3  Past Medical History:  Diagnosis Date   Allergy    Anxiety    Arthritis  Asthma    Hyperlipidemia    Hypertension    Vision abnormalities    states close to blindness due to cataracts   Past Surgical History:  Procedure Laterality Date   caesarean     cataracts     CESAREAN SECTION     COLONOSCOPY WITH PROPOFOL N/A 09/11/2021   Procedure: COLONOSCOPY WITH PROPOFOL;  Surgeon: Lanelle Bal, DO;  [facility]: [facility];  Service: Endoscopy;  Laterality: N/A;  10:30 / ASA II   POLYPECTOMY  09/11/2021   Procedure: POLYPECTOMY;  Surgeon: Lanelle Bal, DO;  [facility];  Service: Endoscopy;;   TOTAL ABDOMINAL HYSTERECTOMY     prolapse   Review of Systems  Constitutional:  + Steady weight since 2021    - fatigue, no subjective hyperthermia, no subjective hypothermia Eyes: no blurry vision, no xerophthalmia ENT: + Severe visual impairment due to corneal defect, no sore throat, no nodules palpated in throat, no dysphagia/odynophagia, no hoarseness   Objective:    BP 134/84   Pulse 64   Ht 5\' 7"  (1.702 m)   Wt 188 lb (85.3 kg)   BMI 29.44 kg/m   Wt Readings from Last 3 Encounters:  11/04/23 188 lb (85.3 kg)  10/09/23 189 lb 1.3 oz (85.8 kg)  07/30/23 194 lb (88 kg)    Physical Exam  Constitutional:  + Obese, not in acute distress, normal state of mind.  uses her walking cane due to visual impairment.   CMP ( most recent) CMP     Component Value Date/Time   NA 143 10/31/2023 0832   K 5.1 10/31/2023 0832   CL 100 10/31/2023 0832   CO2 29 10/31/2023 0832   GLUCOSE 86 10/31/2023 0832   GLUCOSE 97 09/07/2021 0918   BUN 27 10/31/2023 0832   CREATININE 0.87 10/31/2023 0832    CREATININE 0.96 (H) 06/19/2020 1218   CALCIUM 9.4 10/31/2023 0832   PROT 7.0 10/31/2023 0832   ALBUMIN 3.9 10/31/2023 0832   AST 14 10/31/2023 0832   ALT 16 10/31/2023 0832   ALKPHOS 57 10/31/2023 0832   BILITOT 0.3 10/31/2023 0832   GFRNONAA >60 09/07/2021 0918   GFRNONAA 60 06/19/2020 1218   GFRAA 69 06/19/2020 1218     Diabetic Labs (most recent): Lab Results  Component Value Date   HGBA1C 5.8 04/18/2023   HGBA1C 5.8 04/17/2022   HGBA1C 5.7 04/17/2021     Lipid Panel ( most recent) Lipid Panel     Component Value Date/Time   CHOL 192 10/31/2023 0832   TRIG 109 10/31/2023 0832   HDL 81 10/31/2023 0832   CHOLHDL 2.4 10/31/2023 0832   CHOLHDL 3.3 06/19/2020 1218   VLDL 13 04/21/2017 0846   LDLCALC 92 10/31/2023 0832   LDLCALC 110 (H) 06/19/2020 1218     Last ultrasound of the thyroid from 05/02/2017 showed right lobe 4.2 cm previously 4.2 cm, left lobe 3.2 cm previously 4.3 cm. There is a 1 cm nodule on the lower part of the left lobe of the thyroid. This nodule was reported to be stable from prior studies.  Recent Results (from the past 2160 hour(s))  TSH     Status: Abnormal   Collection Time: 10/31/23  8:32 AM  Result Value Ref Range   TSH 12.200 (H) 0.450 - 4.500 uIU/mL  T4, free     Status: None   Collection Time: 10/31/23  8:32 AM  Result Value Ref Range   Free T4 1.35 0.82 -  1.77 ng/dL  Lipid panel     Status: None   Collection Time: 10/31/23  8:32 AM  Result Value Ref Range   Cholesterol, Total 192 100 - 199 mg/dL   Triglycerides 875 0 - 149 mg/dL   HDL 81 >64 mg/dL   VLDL Cholesterol Cal 19 5 - 40 mg/dL   LDL Chol Calc (NIH) 92 0 - 99 mg/dL   Chol/HDL Ratio 2.4 0.0 - 4.4 ratio    Comment:                                   T. Chol/HDL Ratio                                             Men  Women                               1/2 Avg.Risk  3.4    3.3                                   Avg.Risk  5.0    4.4                                2X Avg.Risk   9.6    7.1                                3X Avg.Risk 23.4   11.0   Comprehensive metabolic panel     Status: Abnormal   Collection Time: 10/31/23  8:32 AM  Result Value Ref Range   Glucose 86 70 - 99 mg/dL   BUN 27 8 - 27 mg/dL   Creatinine, Ser 3.32 0.57 - 1.00 mg/dL   eGFR 70 >95 JO/ACZ/6.60   BUN/Creatinine Ratio 31 (H) 12 - 28   Sodium 143 134 - 144 mmol/L   Potassium 5.1 3.5 - 5.2 mmol/L   Chloride 100 96 - 106 mmol/L   CO2 29 20 - 29 mmol/L   Calcium 9.4 8.7 - 10.3 mg/dL   Total Protein 7.0 6.0 - 8.5 g/dL   Albumin 3.9 3.8 - 4.8 g/dL   Globulin, Total 3.1 1.5 - 4.5 g/dL   Bilirubin Total 0.3 0.0 - 1.2 mg/dL   Alkaline Phosphatase 57 44 - 121 IU/L   AST 14 0 - 40 IU/L   ALT 16 0 - 32 IU/L    Thyroid ultrasound on October 11, 2020 No abnormal lymph nodes identified.   IMPRESSION: Stable thyroid ultrasound. Stable 1.0 cm left inferior thyroid nodule which does not meet criteria for biopsy or dedicated follow-up. This nodule appears stable since 2015 and is therefore likely benign.   Assessment & Plan:   1. Hypothyroidism due to Hashimoto's thyroiditis  -Her previsit thyroid function tests are consistent with slight under replacement.  She would benefit from slight increase in her levothyroxine, 112 mcg p.o. daily before breakfast.    - We discussed about the correct intake of her thyroid hormone, on empty stomach at fasting, with water, separated by at least 30  minutes from breakfast and other medications,  and separated by more than 4 hours from calcium, iron, multivitamins, acid reflux medications (PPIs). -Patient is made aware of the fact that thyroid hormone replacement is needed for life, dose to be adjusted by periodic monitoring of thyroid function tests.   2. Nodular goiter-  - In the EMR records, she has a series of thyroid/neck sonograms,  showing stable solitary left lobe nodule at 1 cm in maximum dimension with no suspicious features.  Her most recent  surveillance thyroid ultrasound is showing the same findings with no change.  She has a documented stability with benign features since 2015.  She will not require intervention at this time.    3.  Prediabetes -Her point-of-care A1c is still stable at 5.8%.  She is not on any medications for prediabetes/diabetes.     - she acknowledges that there is a room for improvement in her food and drink choices. - Suggestion is made for her to avoid simple carbohydrates  from her diet including Cakes, Sweet Desserts, Ice Cream, Soda (diet and regular), Sweet Tea, Candies, Chips, Cookies, Store Bought Juices, Alcohol in Excess of  1-2 drinks a day, Artificial Sweeteners,  Coffee Creamer, and "Sugar-free" Products, Lemonade. This will help patient to have more stable blood glucose profile and potentially avoid unintended weight gain.   4.  dyslipidemia: She presents with lipid panel showing LDL at 92, largely unchanged from her last measurement of 93.  She is advised to continue pravastatin 40 mg p.o. nightly.    Side effects and precautions discussed with her.    She is advised to introduce more whole food plant-based diet.  - I advised patient to maintain close follow up with Kerri Perches, MD for primary care needs.   I spent  25  minutes in the care of the patient today including review of labs from Thyroid Function, CMP, and other relevant labs ; imaging/biopsy records (current and previous including abstractions from other facilities); face-to-face time discussing  her lab results and symptoms, medications doses, her options of short and long term treatment based on the latest standards of care / guidelines;   and documenting the encounter.  [name]  participated in the discussions, expressed understanding, and voiced agreement with the above plans.  All questions were answered to her satisfaction. she is encouraged to contact clinic should she have any questions or concerns prior to  her return visit.     Follow up plan: Return in about 6 months (around 05/03/2024) for F/U with Pre-visit Labs, A1c -NV.  Marquis Lunch, MD Phone: [phone]  Fax: [phone]  -  This note was partially dictated with voice recognition software. Similar sounding words can be transcribed inadequately or may not  be corrected upon review.  11/04/2023, 2:48 PM

## 2023-11-24 ENCOUNTER — Other Ambulatory Visit: Payer: Self-pay | Admitting: "Endocrinology

## 2023-12-08 ENCOUNTER — Other Ambulatory Visit: Payer: Self-pay | Admitting: Family Medicine

## 2023-12-09 ENCOUNTER — Ambulatory Visit (INDEPENDENT_AMBULATORY_CARE_PROVIDER_SITE_OTHER): Payer: Medicare Other | Admitting: Family Medicine

## 2023-12-09 ENCOUNTER — Encounter: Payer: Self-pay | Admitting: Family Medicine

## 2023-12-09 VITALS — BP 136/84 | HR 81 | Ht 67.0 in | Wt 189.1 lb

## 2023-12-09 DIAGNOSIS — E782 Mixed hyperlipidemia: Secondary | ICD-10-CM

## 2023-12-09 DIAGNOSIS — E663 Overweight: Secondary | ICD-10-CM

## 2023-12-09 DIAGNOSIS — E038 Other specified hypothyroidism: Secondary | ICD-10-CM

## 2023-12-09 DIAGNOSIS — F419 Anxiety disorder, unspecified: Secondary | ICD-10-CM | POA: Diagnosis not present

## 2023-12-09 DIAGNOSIS — J45991 Cough variant asthma: Secondary | ICD-10-CM

## 2023-12-09 DIAGNOSIS — M5410 Radiculopathy, site unspecified: Secondary | ICD-10-CM

## 2023-12-09 DIAGNOSIS — I10 Essential (primary) hypertension: Secondary | ICD-10-CM

## 2023-12-09 DIAGNOSIS — Z9109 Other allergy status, other than to drugs and biological substances: Secondary | ICD-10-CM

## 2023-12-09 DIAGNOSIS — J309 Allergic rhinitis, unspecified: Secondary | ICD-10-CM

## 2023-12-09 NOTE — Patient Instructions (Addendum)
 F/u in 4 1/2  months, call if you need me sooner  Please bring your medications to your next visit  No changes in medication  Blood pressure medications are amlodipine  10 mg and triamterene  37.5 /25 mg take one once daily of each of them  Thanks for choosing [facility], we consider it a privelige to serve you.

## 2023-12-11 NOTE — Progress Notes (Signed)
 [name]     MRN: [id number]      DOB: 08-07-1951  Chief Complaint  Patient presents with   Follow-up    Follow  up    HPI Ms. Folk is here for follow up and re-evaluation of chronic medical conditions, medication management and review of any available recent lab and radiology data.  Preventive health is updated, specifically  Cancer screening and Immunization.   Questions or concerns regarding consultations or procedures which the PT has had in the interim are  addressed. The PT denies any adverse reactions to current medications since the last visit.  Chronic low back and right hip pain, unchnaged ROS Denies recent fever or chills. Denies sinus pressure, nasal congestion, ear pain or sore throat. Denies chest congestion, productive cough or wheezing. Denies chest pains, palpitations and leg swelling Denies abdominal pain, nausea, vomiting,diarrhea or constipation.   Denies dysuria, frequency, hesitancy or incontinence. Denies headaches, seizures, numbness, or tingling. Denies uncontrolled depression, anxiety or insomnia. Denies skin break down or rash.   PE  BP 136/84   Pulse 81   Ht 5' 7 (1.702 m)   Wt 189 lb 1.9 oz (85.8 kg)   SpO2 93%   BMI 29.62 kg/m   Patient alert and oriented and in no cardiopulmonary distress.  HEENT: No facial asymmetry, EOMI,     Neck supple .  Chest: Clear to auscultation bilaterally.  CVS: S1, S2 no murmurs, no S3.Regular rate.  ABD: Soft non tender.   Ext: No edema  MS: decreased ROM spine, shoulders, hips and knees.  Skin: Intact, no ulcerations or rash noted.  Psych: Good eye contact, normal affect. Memory intact not anxious or depressed appearing.  CNS: CN 3-12 intact, power,  normal throughout.no focal deficits noted.Legally blind   Assessment & Plan  Anxiety Controlled, no change in medication   Hypothyroidism Controlled, no change in medication   Mixed hyperlipidemia Hyperlipidemia:Low fat diet  discussed and encouraged.   Lipid Panel  Lab Results  Component Value Date   CHOL 192 10/31/2023   HDL 81 10/31/2023   LDLCALC 92 10/31/2023   TRIG 109 10/31/2023   CHOLHDL 2.4 10/31/2023     Controlled, no change in medication   Overweight (BMI 25.0-29.9)  Patient re-educated about  the importance of commitment to a  minimum of 150 minutes of exercise per week as able.  The importance of healthy food choices with portion control discussed, as well as eating regularly and within a 12 hour window most days. The need to choose clean , green food 50 to 75% of the time is discussed, as well as to make water  the primary drink and set a goal of 64 ounces water  daily.       12/09/2023   11:00 AM 11/04/2023    9:35 AM 10/09/2023   11:30 AM  Weight /BMI  Weight 189 lb 1.9 oz 188 lb 189 lb 1.3 oz  Height 5' 7 (1.702 m) 5' 7 (1.702 m) 5' 7 (1.702 m)  BMI 29.62 kg/m2 29.44 kg/m2 29.61 kg/m2      Multiple environmental allergies Followed by Allergist, controlled on current meds  Depression, major, single episode, in partial remission (HCC) Controlled, no change in medication   Asthma, cough variant Stable and controlled on current meds  Allergic sinusitis Controlled, no change in medication   Back pain with radiculopathy Unchanged , no current flare  Essential hypertension Controlled, no change in medication DASH diet and commitment to daily physical  activity for a minimum of 30 minutes discussed and encouraged, as a part of hypertension management. The importance of attaining a healthy weight is also discussed.     12/09/2023   11:30 AM 12/09/2023   11:01 AM 12/09/2023   11:00 AM 11/04/2023    9:35 AM 10/09/2023   11:31 AM 10/09/2023   11:30 AM 07/30/2023    1:33 PM  BP/Weight  Systolic BP 136 153 154 134 150 155 --  Diastolic BP 84 87 86 84 84 83 --  Wt. (Lbs)   189.12 188  189.08 194  BMI   29.62 kg/m2 29.44 kg/m2  29.61 kg/m2 30.84 kg/m2

## 2023-12-11 NOTE — Assessment & Plan Note (Signed)
 Controlled, no change in medication DASH diet and commitment to daily physical activity for a minimum of 30 minutes discussed and encouraged, as a part of hypertension management. The importance of attaining a healthy weight is also discussed.     12/09/2023   11:30 AM 12/09/2023   11:01 AM 12/09/2023   11:00 AM 11/04/2023    9:35 AM 10/09/2023   11:31 AM 10/09/2023   11:30 AM 07/30/2023    1:33 PM  BP/Weight  Systolic BP 136 153 154 134 150 155 --  Diastolic BP 84 87 86 84 84 83 --  Wt. (Lbs)   189.12 188  189.08 194  BMI   29.62 kg/m2 29.44 kg/m2  29.61 kg/m2 30.84 kg/m2

## 2023-12-11 NOTE — Assessment & Plan Note (Signed)
 Controlled, no change in medication

## 2023-12-11 NOTE — Assessment & Plan Note (Signed)
Unchanged, no current flare 

## 2023-12-11 NOTE — Assessment & Plan Note (Signed)
  Patient re-educated about  the importance of commitment to a  minimum of 150 minutes of exercise per week as able.  The importance of healthy food choices with portion control discussed, as well as eating regularly and within a 12 hour window most days. The need to choose clean , green food 50 to 75% of the time is discussed, as well as to make water  the primary drink and set a goal of 64 ounces water  daily.       12/09/2023   11:00 AM 11/04/2023    9:35 AM 10/09/2023   11:30 AM  Weight /BMI  Weight 189 lb 1.9 oz 188 lb 189 lb 1.3 oz  Height 5' 7 (1.702 m) 5' 7 (1.702 m) 5' 7 (1.702 m)  BMI 29.62 kg/m2 29.44 kg/m2 29.61 kg/m2

## 2023-12-11 NOTE — Assessment & Plan Note (Signed)
 Hyperlipidemia:Low fat diet discussed and encouraged.   Lipid Panel  Lab Results  Component Value Date   CHOL 192 10/31/2023   HDL 81 10/31/2023   LDLCALC 92 10/31/2023   TRIG 109 10/31/2023   CHOLHDL 2.4 10/31/2023     Controlled, no change in medication

## 2023-12-11 NOTE — Assessment & Plan Note (Signed)
Stable and controlled on current meds 

## 2023-12-11 NOTE — Assessment & Plan Note (Signed)
 Followed by Allergist, controlled on current meds

## 2023-12-15 ENCOUNTER — Telehealth: Payer: Self-pay | Admitting: Family Medicine

## 2023-12-15 NOTE — Telephone Encounter (Signed)
 [facility]   Copied Noted Sleeved (put in provider box)  Fax back to [facility] [phone]

## 2023-12-25 ENCOUNTER — Telehealth: Payer: Self-pay | Admitting: Family Medicine

## 2023-12-25 NOTE — Telephone Encounter (Signed)
PCS forms  Copied Noted Sleeved (put in provider box)  Call [facility] when ready  [phone].

## 2024-01-06 ENCOUNTER — Other Ambulatory Visit: Payer: Self-pay | Admitting: Family Medicine

## 2024-01-06 ENCOUNTER — Other Ambulatory Visit: Payer: Self-pay | Admitting: Internal Medicine

## 2024-01-06 DIAGNOSIS — J309 Allergic rhinitis, unspecified: Secondary | ICD-10-CM

## 2024-02-02 ENCOUNTER — Other Ambulatory Visit: Payer: Self-pay | Admitting: Family Medicine

## 2024-02-02 DIAGNOSIS — J309 Allergic rhinitis, unspecified: Secondary | ICD-10-CM

## 2024-03-03 ENCOUNTER — Other Ambulatory Visit: Payer: Self-pay | Admitting: Family Medicine

## 2024-03-03 DIAGNOSIS — J309 Allergic rhinitis, unspecified: Secondary | ICD-10-CM

## 2024-04-05 ENCOUNTER — Other Ambulatory Visit: Payer: Self-pay | Admitting: Family Medicine

## 2024-04-05 DIAGNOSIS — J309 Allergic rhinitis, unspecified: Secondary | ICD-10-CM

## 2024-04-21 ENCOUNTER — Other Ambulatory Visit (HOSPITAL_COMMUNITY): Payer: Self-pay | Admitting: Family Medicine

## 2024-04-21 ENCOUNTER — Encounter: Payer: Self-pay | Admitting: Family Medicine

## 2024-04-21 ENCOUNTER — Encounter: Payer: Self-pay | Admitting: *Deleted

## 2024-04-21 ENCOUNTER — Ambulatory Visit (INDEPENDENT_AMBULATORY_CARE_PROVIDER_SITE_OTHER): Payer: Medicare Other | Admitting: Family Medicine

## 2024-04-21 VITALS — BP 136/75 | HR 75 | Resp 16 | Ht 67.0 in | Wt 200.0 lb

## 2024-04-21 DIAGNOSIS — E559 Vitamin D deficiency, unspecified: Secondary | ICD-10-CM | POA: Insufficient documentation

## 2024-04-21 DIAGNOSIS — R7303 Prediabetes: Secondary | ICD-10-CM | POA: Diagnosis not present

## 2024-04-21 DIAGNOSIS — I10 Essential (primary) hypertension: Secondary | ICD-10-CM

## 2024-04-21 DIAGNOSIS — D126 Benign neoplasm of colon, unspecified: Secondary | ICD-10-CM

## 2024-04-21 DIAGNOSIS — E063 Autoimmune thyroiditis: Secondary | ICD-10-CM

## 2024-04-21 DIAGNOSIS — E782 Mixed hyperlipidemia: Secondary | ICD-10-CM | POA: Diagnosis not present

## 2024-04-21 DIAGNOSIS — Z1231 Encounter for screening mammogram for malignant neoplasm of breast: Secondary | ICD-10-CM

## 2024-04-21 DIAGNOSIS — Z0001 Encounter for general adult medical examination with abnormal findings: Secondary | ICD-10-CM | POA: Diagnosis not present

## 2024-04-21 MED ORDER — UNABLE TO FIND: 0 refills | Status: DC

## 2024-04-21 MED ORDER — GABAPENTIN 100 MG PO CAPS
100.0000 mg | ORAL_CAPSULE | Freq: Every day | ORAL | 5 refills | Status: DC
Start: 2024-04-21 — End: 2024-10-21

## 2024-04-21 NOTE — Progress Notes (Signed)
      [name]     MRN: [id number]      DOB: [birth year]  Chief Complaint  Patient presents with   Annual Exam    HPI: Patient is in for annual physical exam.  Immunization is reviewed , and  updated if needed.   PE: BP 136/75   Pulse 75   Resp 16   Ht 5\' 7"  (1.702 m)   Wt 200 lb 0.6 oz (90.7 kg)   SpO2 97%   BMI 31.33 kg/m   Pleasant  female, alert and oriented x 3, in no cardio-pulmonary distress. Afebrile. HEENT No facial trauma or asymetry. Sinuses non tender.  Extra occullar muscles intact.. External ears normal, . Neck: decreased ROM, no adenopathy,JVD or thyromegaly.No bruits.  Chest: Clear to ascultation bilaterally.No crackles or wheezes. Non tender to palpation  Breast: Mammogram [place] D and asymptomatic, not examined  Cardiovascular system; Heart sounds normal,  S1 and  S2 ,no S3.  No murmur, or thrill.  Peripheral pulses normal.  Abdomen: Soft, non tender,  Musculoskeletal exam: Decreased  ROM of spine, hips , shoulders and knees. No deformity ,swelling or crepitus noted.esp in left knee  Muscle wasting and  atrophy. Present in RLE  Neurologic: Cranial nerves 2 to 12 intact. dEcreased power and tone in rLE, sensation intact  disturbance in gait. Ambulates with a cane No tremor.  Skin: Intact, no ulceration, erythema , scaling or rash noted. Pigmentation normal throughout  Psych; Normal mood and affect. Judgement and concentration normal   Assessment & Plan:  Encounter for Medicare annual examination with abnormal findings Annual exam as documented. Counseling done  re healthy lifestyle involving commitment to 150 minutes exercise per week, heart healthy diet, and attaining healthy weight.The importance of adequate sleep also discussed. Regular seat belt use and home safety, is also discussed. Changes in health habits are decided on by the patient with goals and time frames  set for achieving them. Immunization and cancer  screening needs are specifically addressed at this visit.

## 2024-04-21 NOTE — Assessment & Plan Note (Signed)

## 2024-04-21 NOTE — Patient Instructions (Addendum)
 F/U ion 5 months, call if you need me sooner  Pls schedule mammogram and dexa at checkout   Labs today, lipid, cmp and eGFR, TSH and free T4, CBC and vit D  Keep eating a lot of vegetables , and avoid sweet, salty and processed foods  Commit to chair exercises daily  I will refer you to Dr Mordechai April for colonoscopy due later this year  New is gabapentin  100 mg one every night at bedtime  Thanks for choosing [facility], we consider it a privelige to serve you.

## 2024-04-22 LAB — CMP14+EGFR
ALT: 21 IU/L (ref 0–32)
AST: 21 IU/L (ref 0–40)
Albumin: 4.1 g/dL (ref 3.8–4.8)
Alkaline Phosphatase: 53 IU/L (ref 44–121)
BUN/Creatinine Ratio: 28 (ref 12–28)
BUN: 21 mg/dL (ref 8–27)
Bilirubin Total: 0.2 mg/dL (ref 0.0–1.2)
CO2: 22 mmol/L (ref 20–29)
Calcium: 9.4 mg/dL (ref 8.7–10.3)
Chloride: 102 mmol/L (ref 96–106)
Creatinine, Ser: 0.76 mg/dL (ref 0.57–1.00)
Globulin, Total: 2.8 g/dL (ref 1.5–4.5)
Glucose: 95 mg/dL (ref 70–99)
Potassium: 4.1 mmol/L (ref 3.5–5.2)
Sodium: 144 mmol/L (ref 134–144)
Total Protein: 6.9 g/dL (ref 6.0–8.5)
eGFR: 82 mL/min/{1.73_m2} (ref >59)

## 2024-04-22 LAB — LIPID PANEL
Chol/HDL Ratio: 2.5 ratio (ref 0.0–4.4)
Cholesterol, Total: 236 mg/dL — ABNORMAL HIGH (ref 100–199)
HDL: 94 mg/dL (ref >39)
LDL Chol Calc (NIH): 123 mg/dL — ABNORMAL HIGH (ref 0–99)
Triglycerides: 109 mg/dL (ref 0–149)
VLDL Cholesterol Cal: 19 mg/dL (ref 5–40)

## 2024-04-22 LAB — CBC WITH DIFFERENTIAL/PLATELET
Basophils Absolute: 0 10*3/uL (ref 0.0–0.2)
Basos: 0 %
EOS (ABSOLUTE): 0.1 10*3/uL (ref 0.0–0.4)
Eos: 1 %
Hematocrit: 40.3 % (ref 34.0–46.6)
Hemoglobin: 13 g/dL (ref 11.1–15.9)
Immature Grans (Abs): 0.1 10*3/uL (ref 0.0–0.1)
Immature Granulocytes: 1 %
Lymphocytes Absolute: 2.9 10*3/uL (ref 0.7–3.1)
Lymphs: 30 %
MCH: 31.8 pg (ref 26.6–33.0)
MCHC: 32.3 g/dL (ref 31.5–35.7)
MCV: 99 fL — ABNORMAL HIGH (ref 79–97)
Monocytes Absolute: 0.9 10*3/uL (ref 0.1–0.9)
Monocytes: 9 %
Neutrophils Absolute: 5.8 10*3/uL (ref 1.4–7.0)
Neutrophils: 59 %
Platelets: 188 10*3/uL (ref 150–450)
RBC: 4.09 x10E6/uL (ref 3.77–5.28)
RDW: 12.9 % (ref 11.7–15.4)
WBC: 9.7 10*3/uL (ref 3.4–10.8)

## 2024-04-22 LAB — TSH: TSH: 4.3 u[IU]/mL (ref 0.450–4.500)

## 2024-04-22 LAB — T4, FREE: Free T4: 1.32 ng/dL (ref 0.82–1.77)

## 2024-04-22 LAB — VITAMIN D 25 HYDROXY (VIT D DEFICIENCY, FRACTURES): Vit D, 25-Hydroxy: 24 ng/mL — ABNORMAL LOW (ref 30.0–100.0)

## 2024-04-23 ENCOUNTER — Other Ambulatory Visit: Payer: Self-pay

## 2024-04-23 ENCOUNTER — Ambulatory Visit: Payer: Self-pay | Admitting: Family Medicine

## 2024-04-23 DIAGNOSIS — I10 Essential (primary) hypertension: Secondary | ICD-10-CM

## 2024-04-23 MED ORDER — ROSUVASTATIN CALCIUM 20 MG PO TABS: 20.0000 mg | ORAL_TABLET | Freq: Every day | ORAL | 5 refills | Status: DC

## 2024-04-23 MED ORDER — UNABLE TO FIND: 0 refills | Status: AC

## 2024-04-23 MED ORDER — VITAMIN D (ERGOCALCIFEROL) 1.25 MG (50000 UNIT) PO CAPS: 50000.0000 [IU] | ORAL_CAPSULE | ORAL | 7 refills | Status: AC

## 2024-04-23 NOTE — Addendum Note (Signed)
 Addended by: Alberteen Huge E on: 04/23/2024 01:01 PM   Modules accepted: Orders

## 2024-04-25 ENCOUNTER — Other Ambulatory Visit: Payer: Self-pay | Admitting: "Endocrinology

## 2024-05-05 ENCOUNTER — Ambulatory Visit (INDEPENDENT_AMBULATORY_CARE_PROVIDER_SITE_OTHER): Payer: Medicare Other | Admitting: "Endocrinology

## 2024-05-05 ENCOUNTER — Encounter: Payer: Self-pay | Admitting: "Endocrinology

## 2024-05-05 VITALS — BP 134/72 | HR 76 | Ht 67.0 in | Wt 199.0 lb

## 2024-05-05 DIAGNOSIS — R7303 Prediabetes: Secondary | ICD-10-CM

## 2024-05-05 DIAGNOSIS — E039 Hypothyroidism, unspecified: Secondary | ICD-10-CM

## 2024-05-05 DIAGNOSIS — E782 Mixed hyperlipidemia: Secondary | ICD-10-CM

## 2024-05-05 LAB — POCT GLYCOSYLATED HEMOGLOBIN (HGB A1C): HbA1c, POC (controlled diabetic range): 6.3 % (ref 0.0–7.0)

## 2024-05-05 NOTE — Addendum Note (Signed)
 Addended by: Ernst Heap on: 05/05/2024 10:20 AM   Modules accepted: Orders

## 2024-05-05 NOTE — Patient Instructions (Signed)

## 2024-05-05 NOTE — Progress Notes (Signed)
 05/05/2024  Endocrinology follow-up note   Subjective:    Patient ID: [name], female    DOB: 26-Nov-1951, PCP Towanda Fret, MD   Past Medical History:  Diagnosis Date   Allergy    Anxiety    Arthritis    Asthma    Hyperlipidemia    Hypertension    Vision abnormalities    states close to blindness due to cataracts   Past Surgical History:  Procedure Laterality Date   caesarean     cataracts     CESAREAN SECTION     COLONOSCOPY WITH PROPOFOL  N/A 09/11/2021   Procedure: COLONOSCOPY WITH PROPOFOL ;  Surgeon: Vinetta Greening, DO;  [facility];  Service: Endoscopy;  Laterality: N/A;  10:30 / ASA II   POLYPECTOMY  09/11/2021   Procedure: POLYPECTOMY;  Surgeon: Vinetta Greening, DO;  [facility]: [facility];  Service: Endoscopy;;   TOTAL ABDOMINAL HYSTERECTOMY     prolapse   Social History   Socioeconomic History   Marital status: Single    Spouse name: Not on file   Number of children: Not on file   Years of education: 12th grade   Highest education level: Not on file  Occupational History   Occupation: disabled    Employer: NOT EMPLOYED  Tobacco Use   Smoking status: Former    Current packs/day: 0.00    Average packs/day: 0.5 packs/day for 18.0 years (9.0 ttl pk-yrs)    Types: Cigarettes    Start date: 74    Quit date: 1989    Years since quitting: 36.4   Smokeless tobacco: Never  Vaping Use   Vaping status: Never Used  Substance and Sexual Activity   Alcohol use: No   Drug use: No   Sexual activity: Not Currently  Other Topics Concern   Not on file  Social History Narrative   Not on file   Social Drivers of Health   Financial Resource Strain: Low Risk  (07/30/2023)   Overall Financial Resource Strain (CARDIA)    Difficulty of Paying Living Expenses: Not hard at all  Food Insecurity: No Food Insecurity (07/30/2023)   Hunger Vital Sign    Worried About Running Out of Food in the Last Year: Never true    Ran Out of Food in  the Last Year: Never true  Transportation Needs: No Transportation Needs (07/30/2023)   PRAPARE - Administrator, Civil Service (Medical): No    Lack of Transportation (Non-Medical): No  Physical Activity: Sufficiently Active (07/30/2023)   Exercise Vital Sign    Days of Exercise per Week: 7 days    Minutes of Exercise per Session: 30 min  Stress: No Stress Concern Present (07/30/2023)   [employer] of Occupational Health - Occupational Stress Questionnaire    Feeling of Stress : Not at all  Social Connections: Socially Isolated (07/30/2023)   Social Connection and Isolation Panel [NHANES]    Frequency of Communication with Friends and Family: More than three times a week    Frequency of Social Gatherings with Friends and Family: More than three times a week    Attends Religious Services: Never    Database administrator or Organizations: No    Attends Banker Meetings: Never    Marital Status: Never married   Outpatient Encounter Medications as of 05/05/2024  Medication Sig   acetaminophen (TYLENOL) 650 MG CR tablet Take 650 mg by mouth as needed for pain.   albuterol  (  VENTOLIN  HFA) 108 (90 Base) MCG/ACT inhaler INHALE 2 PUFFS INTO THE LUNGS EVERY 4 HOURS AS NEEDED FOR COUGHNG ANDWHEEZING.   alendronate  (FOSAMAX ) 70 MG tablet TAKE ONE TABLET BY MOUTH ON AN EMPTY STOMACH WITH A GLASS FULL OF WATER  ONCE A WEEK   amLODipine  (NORVASC ) 10 MG tablet TAKE 1 TABLET BY MOUTH AT BEDTIME.   betamethasone  dipropionate 0.05 % cream Apply topically 2 (two) times daily. Apply twice daily to rash on left elbow for 7 days, then as needed (Patient taking differently: Apply topically as needed. Apply twice daily to rash on left elbow for 7 days, then as needed)   calcium -vitamin D  (OSCAL-500) 500-400 MG-UNIT tablet Take 1 tablet by mouth 2 (two) times daily.   cetirizine  (ZYRTEC ) 10 MG tablet TAKE (1) TABLET BY MOUTH DAILY.   Cholecalciferol  (VITAMIN D3) 50 MCG (2000 UT) capsule  Take 2,000 Units by mouth daily.   clotrimazole -betamethasone  (LOTRISONE ) cream Apply twice daily to rash on upper back for 10 days, then as needed   diclofenac  Sodium (VOLTAREN ) 1 % GEL Apply 2 gm once daily to affected areas   DULERA 100-5 MCG/ACT AERO INHALE 2 PUFFS INTO THE LUNGS TWICE DAILY.   fluticasone  (FLONASE ) 50 MCG/ACT nasal spray PLACE 1 SPRAY INTO EACH NOSTRIL DAILY.   gabapentin  (NEURONTIN ) 100 MG capsule Take 1 capsule (100 mg total) by mouth at bedtime.   levothyroxine  (SYNTHROID ) 112 MCG tablet Take 1 tablet (112 mcg total) by mouth daily before breakfast.   montelukast  (SINGULAIR ) 10 MG tablet TAKE (1) TABLET BY MOUTH AT BEDTIME.   PARoxetine  (PAXIL ) 40 MG tablet TAKE (1) TABLET BY MOUTH EACH MORNING.   polyethylene glycol powder (GLYCOLAX /MIRALAX ) 17 GM/SCOOP powder Take 17 g by mouth 2 (two) times daily as needed.   rosuvastatin  (CRESTOR ) 20 MG tablet Take 1 tablet (20 mg total) by mouth daily.   triamterene -hydrochlorothiazide (MAXZIDE-25) 37.5-25 MG tablet Take one tablet by mouth once daily for blood pressure   UNABLE TO FIND Med Name: Blood pressure monitoring kit with blood pressure cuff. DX I10   Vitamin D , Ergocalciferol , (DRISDOL ) 1.25 MG (50000 UNIT) CAPS capsule Take 1 capsule (50,000 Units total) by mouth every 7 (seven) days.   No facility-administered encounter medications on file as of 05/05/2024.   ALLERGIES: Allergies  Allergen Reactions   Lotensin Hct [Benazepril-Hydrochlorothiazide] Hives    VACCINATION STATUS: Immunization History  Administered Date(s) Administered   Fluad Quad(high Dose 65+) 10/06/2019, 08/18/2020, 08/14/2021, 10/01/2022   Fluad Trivalent(High Dose 65+) 10/09/2023   H1N1 11/15/2008   Influenza Split 08/30/2011, 08/14/2012   Influenza Whole 09/15/2009, 09/26/2010   Influenza,inj,Quad PF,6+ Mos 10/18/2013, 08/25/2014, 12/13/2015, 08/05/2016, 09/22/2017, 07/28/2018   Moderna SARS-COV2 Booster Vaccination 08/25/2021, 08/25/2023    Moderna Sars-Covid-2 Vaccination 01/16/2020, 02/16/2020, 11/10/2020, 04/16/2021, 08/30/2022   Pneumococcal Conjugate-13 05/03/2015   Pneumococcal Polysaccharide-23 08/05/2016   Td 03/02/2004   Tdap 12/12/2011, 05/12/2023   Zoster Recombinant(Shingrix) 09/13/2019, 11/15/2019    HPI 73 year old female patient with medical history as above.  She is returning for follow-up with repeat thyroid  function test for follow-up of hypothyroidism, hyperlipidemia, prediabetes. She is currently on stable dose of levothyroxine  112 mcg p.o. daily before breakfast.  She reports better compliance.  Her previsit thyroid  function tests are consistent with appropriate replacement.      She has no new complaints today.   -She has a steady weight.  Her previsit thyroid  function tests are consistent with slight under replacement. - She is a poor historian accompanied by her sister.  -  She denies dysphagia, shortness of breath, nor voice change. She had  a series of thyroid  ultrasound studies, last one from 05/02/2017 showing 1 cm solitary nodule on the left lobe of the thyroid  .  Her most recent surveillance thyroid  ultrasound in 2021 reveals the same finding, unchanged. - Ultrasound description of the thyroid  summarized below. 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  Her point-of-care A1c is higher today at 6.3% increasing from 5.8%.   still consistent with prediabetes.   She is on Crestor  for hyperlipidemia, has not been consistent taking this medication.  Her lipid panel is worsening.      Current Outpatient Medications:    acetaminophen (TYLENOL) 650 MG CR tablet, Take 650 mg by mouth as needed for pain., Disp: , Rfl:    albuterol  (VENTOLIN  HFA) 108 (90 Base) MCG/ACT inhaler, INHALE 2 PUFFS INTO THE LUNGS EVERY 4 HOURS AS NEEDED FOR COUGHNG ANDWHEEZING., Disp: 18 g, Rfl: 3   alendronate  (FOSAMAX ) 70 MG tablet, TAKE ONE TABLET BY MOUTH ON AN EMPTY  STOMACH WITH A GLASS FULL OF WATER  ONCE A WEEK, Disp: 4 tablet, Rfl: 0   amLODipine  (NORVASC ) 10 MG tablet, TAKE 1 TABLET BY MOUTH AT BEDTIME., Disp: 30 tablet, Rfl: 0   betamethasone  dipropionate 0.05 % cream, Apply topically 2 (two) times daily. Apply twice daily to rash on left elbow for 7 days, then as needed (Patient taking differently: Apply topically as needed. Apply twice daily to rash on left elbow for 7 days, then as needed), Disp: 45 g, Rfl: 0   calcium -vitamin D  (OSCAL-500) 500-400 MG-UNIT tablet, Take 1 tablet by mouth 2 (two) times daily., Disp: 60 tablet, Rfl: 11   cetirizine  (ZYRTEC ) 10 MG tablet, TAKE (1) TABLET BY MOUTH DAILY., Disp: 30 tablet, Rfl: 0   Cholecalciferol  (VITAMIN D3) 50 MCG (2000 UT) capsule, Take 2,000 Units by mouth daily., Disp: , Rfl:    clotrimazole -betamethasone  (LOTRISONE ) cream, Apply twice daily to rash on upper back for 10 days, then as needed, Disp: 45 g, Rfl: 1   diclofenac  Sodium (VOLTAREN ) 1 % GEL, Apply 2 gm once daily to affected areas, Disp: 300 g, Rfl: 1   DULERA 100-5 MCG/ACT AERO, INHALE 2 PUFFS INTO THE LUNGS TWICE DAILY., Disp: 13 g, Rfl: 0   fluticasone  (FLONASE ) 50 MCG/ACT nasal spray, PLACE 1 SPRAY INTO EACH NOSTRIL DAILY., Disp: 16 g, Rfl: 0   gabapentin  (NEURONTIN ) 100 MG capsule, Take 1 capsule (100 mg total) by mouth at bedtime., Disp: 30 capsule, Rfl: 5   levothyroxine  (SYNTHROID ) 112 MCG tablet, Take 1 tablet (112 mcg total) by mouth daily before breakfast., Disp: 90 tablet, Rfl: 1   montelukast  (SINGULAIR ) 10 MG tablet, TAKE (1) TABLET BY MOUTH AT BEDTIME., Disp: 30 tablet, Rfl: 0   PARoxetine  (PAXIL ) 40 MG tablet, TAKE (1) TABLET BY MOUTH EACH MORNING., Disp: 30 tablet, Rfl: 0   polyethylene glycol powder (GLYCOLAX /MIRALAX ) 17 GM/SCOOP powder, Take 17 g by mouth 2 (two) times daily as needed., Disp: 3350 g, Rfl: 1   rosuvastatin  (CRESTOR ) 20 MG tablet, Take 1 tablet (20 mg total) by mouth daily., Disp: 30 tablet, Rfl: 5    triamterene -hydrochlorothiazide (MAXZIDE-25) 37.5-25 MG tablet, Take one tablet by mouth once daily for blood pressure, Disp: 90 tablet, Rfl: 3   UNABLE TO FIND, Med Name: Blood pressure monitoring kit with blood pressure cuff. DX I10, Disp: 1 each, Rfl: 0   Vitamin D , Ergocalciferol , (DRISDOL ) 1.25 MG (50000  UNIT) CAPS capsule, Take 1 capsule (50,000 Units total) by mouth every 7 (seven) days., Disp: 5 capsule, Rfl: 7  Past Medical History:  Diagnosis Date   Allergy    Anxiety    Arthritis    Asthma    Hyperlipidemia    Hypertension    Vision abnormalities    states close to blindness due to cataracts   Past Surgical History:  Procedure Laterality Date   caesarean     cataracts     CESAREAN SECTION     COLONOSCOPY WITH PROPOFOL  N/A 09/11/2021   Procedure: COLONOSCOPY WITH PROPOFOL ;  Surgeon: Vinetta Greening, DO;  [facility];  Service: Endoscopy;  Laterality: N/A;  10:30 / ASA II   POLYPECTOMY  09/11/2021   Procedure: POLYPECTOMY;  Surgeon: Vinetta Greening, DO;  [facility];  Service: Endoscopy;;   TOTAL ABDOMINAL HYSTERECTOMY     prolapse   Review of Systems  Constitutional:  + Steady weight since 2021    - fatigue, no subjective hyperthermia, no subjective hypothermia Eyes: no blurry vision, no xerophthalmia ENT: + Severe visual impairment due to corneal defect, no sore throat, no nodules palpated in throat, no dysphagia/odynophagia, no hoarseness   Objective:    BP 134/72   Pulse 76   Ht 5\' 7"  (1.702 m)   Wt 199 lb (90.3 kg)   BMI 31.17 kg/m   Wt Readings from Last 3 Encounters:  05/05/24 199 lb (90.3 kg)  04/21/24 200 lb 0.6 oz (90.7 kg)  12/09/23 189 lb 1.9 oz (85.8 kg)    Physical Exam  Constitutional:  + Obese, not in acute distress, normal state of mind.  uses her walking cane due to visual impairment.   CMP ( most recent) CMP     Component Value Date/Time   NA 144 04/21/2024 1130   K 4.1 04/21/2024 1130   CL 102 04/21/2024  1130   CO2 22 04/21/2024 1130   GLUCOSE 95 04/21/2024 1130   GLUCOSE 97 09/07/2021 0918   BUN 21 04/21/2024 1130   CREATININE 0.76 04/21/2024 1130   CREATININE 0.96 (H) 06/19/2020 1218   CALCIUM  9.4 04/21/2024 1130   PROT 6.9 04/21/2024 1130   ALBUMIN 4.1 04/21/2024 1130   AST 21 04/21/2024 1130   ALT 21 04/21/2024 1130   ALKPHOS 53 04/21/2024 1130   BILITOT 0.2 04/21/2024 1130   GFRNONAA >60 09/07/2021 0918   GFRNONAA 60 06/19/2020 1218   GFRAA 69 06/19/2020 1218     Diabetic Labs (most recent): Lab Results  Component Value Date   HGBA1C 5.8 04/18/2023   HGBA1C 5.8 04/17/2022   HGBA1C 5.7 04/17/2021     Lipid Panel ( most recent) Lipid Panel     Component Value Date/Time   CHOL 236 (H) 04/21/2024 1130   TRIG 109 04/21/2024 1130   HDL 94 04/21/2024 1130   CHOLHDL 2.5 04/21/2024 1130   CHOLHDL 3.3 06/19/2020 1218   VLDL 13 04/21/2017 0846   LDLCALC 123 (H) 04/21/2024 1130   LDLCALC 110 (H) 06/19/2020 1218     Last ultrasound of the thyroid  from 05/02/2017 showed right lobe 4.2 cm previously 4.2 cm, left lobe 3.2 cm previously 4.3 cm. There is a 1 cm nodule on the lower part of the left lobe of the thyroid . This nodule was reported to be stable from prior studies.  Recent Results (from the past 2160 hours)  Lipid panel     Status: Abnormal   Collection Time: 04/21/24  11:30 AM  Result Value Ref Range   Cholesterol, Total 236 (H) 100 - 199 mg/dL   Triglycerides 161 0 - 149 mg/dL   HDL 94 >09 mg/dL   VLDL Cholesterol Cal 19 5 - 40 mg/dL   LDL Chol Calc (NIH) 604 (H) 0 - 99 mg/dL   Chol/HDL Ratio 2.5 0.0 - 4.4 ratio    Comment:                                   T. Chol/HDL Ratio                                             Men  Women                               1/2 Avg.Risk  3.4    3.3                                   Avg.Risk  5.0    4.4                                2X Avg.Risk  9.6    7.1                                3X Avg.Risk 23.4   11.0   CMP14+EGFR      Status: None   Collection Time: 04/21/24 11:30 AM  Result Value Ref Range   Glucose 95 70 - 99 mg/dL   BUN 21 8 - 27 mg/dL   Creatinine, Ser 5.40 0.57 - 1.00 mg/dL   eGFR 82 >98 JX/BJY/7.82   BUN/Creatinine Ratio 28 12 - 28   Sodium 144 134 - 144 mmol/L   Potassium 4.1 3.5 - 5.2 mmol/L   Chloride 102 96 - 106 mmol/L   CO2 22 20 - 29 mmol/L   Calcium  9.4 8.7 - 10.3 mg/dL   Total Protein 6.9 6.0 - 8.5 g/dL   Albumin 4.1 3.8 - 4.8 g/dL   Globulin, Total 2.8 1.5 - 4.5 g/dL   Bilirubin Total 0.2 0.0 - 1.2 mg/dL   Alkaline Phosphatase 53 44 - 121 IU/L   AST 21 0 - 40 IU/L   ALT 21 0 - 32 IU/L  TSH     Status: None   Collection Time: 04/21/24 11:30 AM  Result Value Ref Range   TSH 4.300 0.450 - 4.500 uIU/mL  CBC with Differential/Platelet     Status: Abnormal   Collection Time: 04/21/24 11:30 AM  Result Value Ref Range   WBC 9.7 3.4 - 10.8 x10E3/uL   RBC 4.09 3.77 - 5.28 x10E6/uL   Hemoglobin 13.0 11.1 - 15.9 g/dL   Hematocrit 95.6 21.3 - 46.6 %   MCV 99 (H) 79 - 97 fL   MCH 31.8 26.6 - 33.0 pg   MCHC 32.3 31.5 - 35.7 g/dL   RDW 08.6 57.8 - 46.9 %   Platelets 188 150 - 450 x10E3/uL   Neutrophils 59 Not Estab. %   Lymphs 30  Not Estab. %   Monocytes 9 Not Estab. %   Eos 1 Not Estab. %   Basos 0 Not Estab. %   Neutrophils Absolute 5.8 1.4 - 7.0 x10E3/uL   Lymphocytes Absolute 2.9 0.7 - 3.1 x10E3/uL   Monocytes Absolute 0.9 0.1 - 0.9 x10E3/uL   EOS (ABSOLUTE) 0.1 0.0 - 0.4 x10E3/uL   Basophils Absolute 0.0 0.0 - 0.2 x10E3/uL   Immature Granulocytes 1 Not Estab. %   Immature Grans (Abs) 0.1 0.0 - 0.1 x10E3/uL  VITAMIN D  25 Hydroxy (Vit-D Deficiency, Fractures)     Status: Abnormal   Collection Time: 04/21/24 11:30 AM  Result Value Ref Range   Vit D, 25-Hydroxy 24.0 (L) 30.0 - 100.0 ng/mL    Comment: Vitamin D  deficiency has been defined by the Institute of Medicine and an Endocrine Society practice guideline as a level of serum 25-OH vitamin D  less than 20 ng/mL  (1,2). The Endocrine Society went on to further define vitamin D  insufficiency as a level between 21 and 29 ng/mL (2). 1. IOM (Institute of Medicine). 2010. Dietary reference    intakes for calcium  and D. [place]: The    [employer]. 2. Holick MF, Binkley [place], Bischoff-Ferrari HA, et al.    Evaluation, treatment, and prevention of vitamin D     deficiency: an Endocrine Society clinical practice    guideline. JCEM. 2011 Jul; 96(7):1911-30.   T4, free     Status: None   Collection Time: 04/21/24 11:30 AM  Result Value Ref Range   Free T4 1.32 0.82 - 1.77 ng/dL    Thyroid  ultrasound on October 11, 2020 No abnormal lymph nodes identified.   IMPRESSION: Stable thyroid  ultrasound. Stable 1.0 cm left inferior thyroid  nodule which does not meet criteria for biopsy or dedicated follow-up. This nodule appears stable since 2015 and is therefore likely benign.   Assessment & Plan:   1. Hypothyroidism due to Hashimoto's thyroiditis  -Her previsit thyroid  function tests are consistent with appropriate replacement.  She is advised to continue levothyroxine  112 mcg p.o. daily before breakfast.     - We discussed about the correct intake of her thyroid  hormone, on empty stomach at fasting, with water , separated by at least 30 minutes from breakfast and other medications,  and separated by more than 4 hours from calcium , iron, multivitamins, acid reflux medications (PPIs). -Patient is made aware of the fact that thyroid  hormone replacement is needed for life, dose to be adjusted by periodic monitoring of thyroid  function tests.    2. Nodular goiter-  - In the EMR records, she has a series of thyroid /neck sonograms,  showing stable solitary left lobe nodule at 1 cm in maximum dimension with no suspicious features.  Her most recent surveillance thyroid  ultrasound is showing the same findings with no change.  She has a documented stability with benign features since 2015.  She will  not require intervention at this time.    3.  Prediabetes -Her point-of-care A1c is 6.3% increasing from 5.8%.  She was offered low-dose metformin to slow down the progression to type 2 diabetes, however patient declines and agrees to change her lifestyle-as detailed below.    - she acknowledges that there is a room for improvement in her food and drink choices. - Suggestion is made for her to avoid simple carbohydrates  from her diet including Cakes, Sweet Desserts, Ice Cream, Soda (diet and regular), Sweet Tea, Candies, Chips, Cookies, Store Bought Juices, Alcohol in Excess of  1-2  drinks a day, Artificial Sweeteners,  Coffee Creamer, and "Sugar-free" Products, Lemonade. This will help patient to have more stable blood glucose profile and potentially avoid unintended weight gain.  If her A1c remains above 6% by next visit, she would be approached again for metformin.    4.  dyslipidemia: She presents with lipid panel worsening with LDL at 123.  She is advised to continue pravastatin 40 mg p.o. nightly.  Showing LDL at 92, largely unchanged from her last measurement of 93.  She is advised to continue Crestor  20 mg p.o. nightly.    Side effects and precautions discussed with her.    She is advised to introduce more whole food plant-based diet.  - I advised patient to maintain close follow up with Towanda Fret, MD for primary care needs.   I spent  25  minutes in the care of the patient today including review of labs from Thyroid  Function, CMP, and other relevant labs ; imaging/biopsy records (current and previous including abstractions from other facilities); face-to-face time discussing  her lab results and symptoms, medications doses, her options of short and long term treatment based on the latest standards of care / guidelines;   and documenting the encounter.  Branae V Dao  participated in the discussions, expressed understanding, and voiced agreement with the above plans.  All  questions were answered to her satisfaction. she is encouraged to contact clinic should she have any questions or concerns prior to her return visit.    Follow up plan: Return in about 6 months (around 11/05/2024) for Fasting Labs  in AM B4 8, A1c -[place].  Kalvin Orf, MD Phone: [phone]  Fax: [phone]  -  This note was partially dictated with voice recognition software. Similar sounding words can be transcribed inadequately or may not  be corrected upon review.  05/05/2024, 9:20 AM

## 2024-05-06 ENCOUNTER — Other Ambulatory Visit: Payer: Self-pay | Admitting: Family Medicine

## 2024-05-06 DIAGNOSIS — J309 Allergic rhinitis, unspecified: Secondary | ICD-10-CM

## 2024-05-07 ENCOUNTER — Telehealth: Payer: Self-pay

## 2024-05-07 NOTE — Telephone Encounter (Signed)
 Lvm for Nikki to cb. Lovastatin  was d/c to start Rosuvastatin 

## 2024-05-07 NOTE — Telephone Encounter (Signed)
 Copied from CRM [id number]. Topic: Clinical - Medication Question >> May 07, 2024  2:20 PM Fredrica W wrote: Reason for CRM: Landa Pine with [facility] called to have provider to reach out to patient medication adherence for lovastatin  (MEVACOR ) 40 MG tablet or to call them to confirm if medication was discontinued. Phone:  [phone] ext [phone] Thank You

## 2024-05-13 DIAGNOSIS — Z008 Encounter for other general examination: Secondary | ICD-10-CM | POA: Diagnosis not present

## 2024-05-13 DIAGNOSIS — I10 Essential (primary) hypertension: Secondary | ICD-10-CM | POA: Diagnosis not present

## 2024-06-03 ENCOUNTER — Other Ambulatory Visit: Payer: Self-pay | Admitting: Family Medicine

## 2024-06-03 DIAGNOSIS — J309 Allergic rhinitis, unspecified: Secondary | ICD-10-CM

## 2024-07-05 ENCOUNTER — Other Ambulatory Visit: Payer: Self-pay | Admitting: Family Medicine

## 2024-07-26 ENCOUNTER — Ambulatory Visit (HOSPITAL_COMMUNITY)
Admission: RE | Admit: 2024-07-26 | Discharge: 2024-07-26 | Disposition: A | Discharge status: Home / Self Care | Source: Ambulatory Visit | Attending: Family Medicine | Admitting: Family Medicine

## 2024-07-26 DIAGNOSIS — Z1231 Encounter for screening mammogram for malignant neoplasm of breast: Secondary | ICD-10-CM | POA: Diagnosis not present

## 2024-07-26 DIAGNOSIS — Z0000 Encounter for general adult medical examination without abnormal findings: Secondary | ICD-10-CM | POA: Insufficient documentation

## 2024-07-26 DIAGNOSIS — M8589 Other specified disorders of bone density and structure, multiple sites: Secondary | ICD-10-CM | POA: Diagnosis not present

## 2024-07-26 DIAGNOSIS — Z780 Asymptomatic menopausal state: Secondary | ICD-10-CM | POA: Insufficient documentation

## 2024-07-27 ENCOUNTER — Ambulatory Visit: Payer: Self-pay | Admitting: Family Medicine

## 2024-08-02 ENCOUNTER — Ambulatory Visit (INDEPENDENT_AMBULATORY_CARE_PROVIDER_SITE_OTHER): Payer: Medicare Other

## 2024-08-02 VITALS — Ht 67.0 in | Wt 199.0 lb

## 2024-08-02 DIAGNOSIS — Z0000 Encounter for general adult medical examination without abnormal findings: Secondary | ICD-10-CM

## 2024-08-02 NOTE — Patient Instructions (Signed)
 [name] , Thank you for taking time out of your busy schedule to complete your Annual Wellness Visit with me. I enjoyed our conversation and look forward to speaking with you again next year. I, as well as your care team,  appreciate your ongoing commitment to your health goals. Please review the following plan we discussed and let me know if I can assist you in the future.  Your Game plan/ To Do List   Follow up Visits: We will see or speak with you next year for your Next Medicare AWV with our clinical staff   Clinician Recommendations:  Aim for 30 minutes of exercise or brisk walking, 6-8 glasses of water , and 5 servings of fruits and vegetables each day.    Wishing you many blessings and good health during the next year until our next visit.  -Aliviyah Malanga   This is a list of the screenings recommended for you:  Health Maintenance  Topic Date Due   COVID-19 Vaccine (8 - 2024-25 season) 10/20/2023   Flu Shot  07/09/2024   Colon Cancer Screening  09/11/2024   Mammogram  07/26/2025   Medicare Annual Wellness Visit  08/02/2025   DEXA scan (bone density measurement)  07/26/2026   DTaP/Tdap/Td vaccine (4 - Td or Tdap) 05/11/2033   Pneumococcal Vaccine for age over 74  Completed   Hepatitis C Screening  Completed   Zoster (Shingles) Vaccine  Completed   HPV Vaccine  Aged Out   Meningitis B Vaccine  Aged Out    Advanced directives: (Declined) Advance directive discussed with you today. Even though you declined this today, please call our office should you change your mind, and we can give you the proper paperwork for you to fill out. Advance Care Planning is important because it:  [x]  Makes sure you receive the medical care that is consistent with your values, goals, and preferences  [x]  It provides guidance to your family and loved ones and reduces their decisional burden about whether or not they are making the right decisions based on your wishes.  Follow the link provided in your  after visit summary or read over the paperwork we have mailed to you to help you started getting your Advance Directives in place. If you need assistance in completing these, please reach out to us  so that we can help you!  See attachments for Preventive Care and Fall Prevention Tips.

## 2024-08-02 NOTE — Progress Notes (Signed)
 Subjective:   [name] is a 73 y.o. who presents for a Medicare Wellness preventive visit.  As a reminder, Annual Wellness Visits don't include a physical exam, and some assessments may be limited, especially if this visit is performed virtually. We may recommend an in-person follow-up visit with your provider if needed.  Visit Complete: Virtual I connected with  Meri V Rubert on 08/02/24 by a audio enabled telemedicine application and verified that I am speaking with the correct person using two identifiers.  Patient Location: Home  Provider Location: Home Office  I discussed the limitations of evaluation and management by telemedicine. The patient expressed understanding and agreed to proceed.  Vital Signs: Because this visit was a virtual/telehealth visit, some criteria may be missing or patient reported. Any vitals not documented were not able to be obtained and vitals that have been documented are patient reported.  VideoDeclined- This patient declined Librarian, academic. Therefore the visit was completed with audio only.  Persons Participating in Visit: Patient.  AWV Questionnaire: No: Patient Medicare AWV questionnaire was not completed prior to this visit.  Cardiac Risk Factors include: advanced age (>21men, >96 women);sedentary lifestyle;obesity (BMI >30kg/m2);dyslipidemia;hypertension     Objective:    Today's Vitals   08/02/24 1405 08/02/24 1406  Weight: 199 lb (90.3 kg)   Height: 5' 7 (1.702 m)   PainSc:  8    Body mass index is 31.17 kg/m.     08/02/2024    2:04 PM 07/30/2023    1:33 PM 07/03/2022    1:31 PM 09/11/2021    9:02 AM 06/13/2021    1:19 PM 04/20/2018    1:15 PM 12/30/2016    9:57 AM  Advanced Directives  Does Patient Have a Medical Advance Directive? No No No No No No  No   Would patient like information on creating a medical advance directive? No - Patient declined No - Patient declined No - Patient declined  No - Patient declined No - Patient declined Yes (ED - Information included in AVS)  Yes (MAU/Ambulatory/Procedural Areas - Information given)      Data saved with a previous flowsheet row definition    Current Medications (verified) Outpatient Encounter Medications as of 08/02/2024  Medication Sig   acetaminophen (TYLENOL) 650 MG CR tablet Take 650 mg by mouth as needed for pain.   albuterol  (VENTOLIN  HFA) 108 (90 Base) MCG/ACT inhaler INHALE 2 PUFFS INTO THE LUNGS EVERY 4 HOURS AS NEEDED FOR COUGHNG ANDWHEEZING.   alendronate  (FOSAMAX ) 70 MG tablet TAKE ONE TABLET BY MOUTH ON AN EMPTY STOMACH WITH A GLASS FULL OF WATER  ONCE A WEEK   amLODipine  (NORVASC ) 10 MG tablet TAKE 1 TABLET BY MOUTH AT BEDTIME.   betamethasone  dipropionate 0.05 % cream Apply topically 2 (two) times daily. Apply twice daily to rash on left elbow for 7 days, then as needed (Patient taking differently: Apply topically as needed. Apply twice daily to rash on left elbow for 7 days, then as needed)   calcium -vitamin D  (OSCAL-500) 500-400 MG-UNIT tablet Take 1 tablet by mouth 2 (two) times daily.   cetirizine  (ZYRTEC ) 10 MG tablet TAKE (1) TABLET BY MOUTH DAILY.   Cholecalciferol  (VITAMIN D3) 50 MCG (2000 UT) capsule Take 2,000 Units by mouth daily.   clotrimazole -betamethasone  (LOTRISONE ) cream Apply twice daily to rash on upper back for 10 days, then as needed   diclofenac  Sodium (VOLTAREN ) 1 % GEL Apply 2 gm once daily to affected areas   DULERA  100-5 MCG/ACT AERO INHALE 2 PUFFS INTO THE LUNGS TWICE DAILY.   fluticasone  (FLONASE ) 50 MCG/ACT nasal spray PLACE 1 SPRAY INTO EACH NOSTRIL DAILY.   gabapentin  (NEURONTIN ) 100 MG capsule Take 1 capsule (100 mg total) by mouth at bedtime.   levothyroxine  (SYNTHROID ) 112 MCG tablet Take 1 tablet (112 mcg total) by mouth daily before breakfast.   montelukast  (SINGULAIR ) 10 MG tablet TAKE (1) TABLET BY MOUTH AT BEDTIME.   PARoxetine  (PAXIL ) 40 MG tablet TAKE (1) TABLET BY MOUTH EACH  MORNING.   polyethylene glycol powder (GLYCOLAX /MIRALAX ) 17 GM/SCOOP powder Take 17 g by mouth 2 (two) times daily as needed.   rosuvastatin  (CRESTOR ) 20 MG tablet Take 1 tablet (20 mg total) by mouth daily.   triamterene -hydrochlorothiazide (MAXZIDE-25) 37.5-25 MG tablet Take one tablet by mouth once daily for blood pressure   UNABLE TO FIND Med Name: Blood pressure monitoring kit with blood pressure cuff. DX I10   Vitamin D , Ergocalciferol , (DRISDOL ) 1.25 MG (50000 UNIT) CAPS capsule Take 1 capsule (50,000 Units total) by mouth every 7 (seven) days.   No facility-administered encounter medications on file as of 08/02/2024.    Allergies (verified) Lotensin hct [benazepril-hydrochlorothiazide]   History: Past Medical History:  Diagnosis Date   Allergy    Anxiety    Arthritis    Asthma    Hyperlipidemia    Hypertension    Vision abnormalities    states close to blindness due to cataracts   Past Surgical History:  Procedure Laterality Date   caesarean     cataracts     CESAREAN SECTION     COLONOSCOPY WITH PROPOFOL  N/A 09/11/2021   Procedure: COLONOSCOPY WITH PROPOFOL ;  Surgeon: Cindie Carlin POUR, DO;  [facility];  Service: Endoscopy;  Laterality: N/A;  10:30 / ASA II   POLYPECTOMY  09/11/2021   Procedure: POLYPECTOMY;  Surgeon: Cindie Carlin POUR, DO;  Location: [facility];  Service: Endoscopy;;   TOTAL ABDOMINAL HYSTERECTOMY     prolapse   Family History  Problem Relation Age of Onset   Cancer Mother 93       stomach   Kidney disease Mother    Hypertension Mother    Diabetes Father    Cancer Father        lung    Diabetes Sister    Hyperlipidemia Sister    Diabetes Sister    Diabetes Sister    Cancer Daughter 24       cervix   Alcohol abuse Brother    Diabetes Brother    Diabetes Brother    Diabetes Brother    Diabetes Brother    Arthritis Other    Asthma Other    Breast cancer Neg Hx    Social History   Socioeconomic History   Marital  status: Single    Spouse name: Not on file   Number of children: Not on file   Years of education: 12th grade   Highest education level: Not on file  Occupational History   Occupation: disabled    Employer: NOT EMPLOYED  Tobacco Use   Smoking status: Former    Current packs/day: 0.00    Average packs/day: 0.5 packs/day for 18.0 years (9.0 ttl pk-yrs)    Types: Cigarettes    Start date: 86    Quit date: 1989    Years since quitting: 36.6   Smokeless tobacco: Never  Vaping Use   Vaping status: Never Used  Substance and Sexual Activity   Alcohol  use: No   Drug use: No   Sexual activity: Not Currently  Other Topics Concern   Not on file  Social History Narrative   Not on file   Social Drivers of Health   Financial Resource Strain: Low Risk  (08/02/2024)   Overall Financial Resource Strain (CARDIA)    Difficulty of Paying Living Expenses: Not hard at all  Food Insecurity: No Food Insecurity (08/02/2024)   Hunger Vital Sign    Worried About Running Out of Food in the Last Year: Never true    Ran Out of Food in the Last Year: Never true  Transportation Needs: No Transportation Needs (08/02/2024)   PRAPARE - Administrator, Civil Service (Medical): No    Lack of Transportation (Non-Medical): No  Physical Activity: Inactive (08/02/2024)   Exercise Vital Sign    Days of Exercise per Week: 0 days    Minutes of Exercise per Session: 0 min  Stress: No Stress Concern Present (08/02/2024)   [employer] of Occupational Health - Occupational Stress Questionnaire    Feeling of Stress: Not at all  Social Connections: Socially Isolated (08/02/2024)   Social Connection and Isolation Panel    Frequency of Communication with Friends and Family: More than three times a week    Frequency of Social Gatherings with Friends and Family: More than three times a week    Attends Religious Services: Never    Database administrator or Organizations: No    Attends Hospital doctor: Never    Marital Status: Never married    Tobacco Counseling Counseling given: Yes    Clinical Intake:  Pre-visit preparation completed: Yes  Pain : 0-10 Pain Score: 8  Pain Type: Chronic pain Pain Location: Back Pain Orientation: Lower Pain Descriptors / Indicators: Constant Pain Onset: More than a month ago Pain Frequency: Constant     BMI - recorded: 31.17 Nutritional Risks: None Diabetes: No  Lab Results  Component Value Date   HGBA1C 6.3 05/05/2024   HGBA1C 5.8 04/18/2023   HGBA1C 5.8 04/17/2022     How often do you need to have someone help you when you read instructions, pamphlets, or other written materials from your doctor or pharmacy?: 4 - Often (vision deficits)  Interpreter Needed?: No  Information entered by :: Shivansh Hardaway W CMA (AAMA)   Activities of Daily Living     08/02/2024    2:30 PM  In your present state of health, do you have any difficulty performing the following activities:  Hearing? 1  Vision? 1  Difficulty concentrating or making decisions? 0  Walking or climbing stairs? 1  Comment vision difficulties  Dressing or bathing? 0  Doing errands, shopping? 0  Preparing Food and eating ? N  Using the Toilet? N  In the past six months, have you accidently leaked urine? N  Do you have problems with loss of bowel control? N  Managing your Medications? Y  Comment medications are pill packaged. but she can take them by herself.  Managing your Finances? Y  Comment grand daughter helps her  Housekeeping or managing your Housekeeping? Y  Comment home health aid assists her with home management    Patient Care Team: Antonetta Rollene BRAVO, MD as PCP - General (Family Medicine) Karis Clunes, MD as Consulting Physician (Otolaryngology) Margrette Taft BRAVO, MD as Consulting Physician (Orthopedic Surgery) Cindie Carlin POUR, DO as Consulting Physician (Gastroenterology)  I have updated your Care Teams any recent Medical Services you  may have  received from other providers in the past year.     Assessment:   This is a routine wellness examination for Aleza.  Hearing/Vision screen Hearing Screening - Comments:: Patient states she has hearing difficulties. She's had testing but wasn't told she needed hearing aids. Declines another referral.  Vision Screening - Comments:: Patient is not up to date on yearly eye exams.  Per patient she is legally blind and needs corneal transplants. Was going to a doctor in [place] but there is nothing they can do for her outside of the transplants.    Goals Addressed               This Visit's Progress     Stay Active and Healthy (pt-stated)        I want to remain as active, healthy, and independent as possible        Depression Screen     08/02/2024    2:35 PM 04/21/2024   10:40 AM 12/09/2023   11:01 AM 10/09/2023   11:31 AM 07/30/2023    1:39 PM 02/13/2023   10:50 AM 12/06/2022    4:00 PM  PHQ 2/9 Scores  PHQ - 2 Score 0 0 0 0 1 5 4   PHQ- 9 Score 0    4 17 9     Fall Risk     08/02/2024    2:28 PM 04/21/2024   10:40 AM 12/09/2023   11:01 AM 10/09/2023   11:31 AM 07/30/2023    1:44 PM  Fall Risk   Falls in the past year? 0 0 0 0 0  Number falls in past yr: 0 0 0 0 0  Injury with Fall? 0 0 0 0 0  Risk for fall due to : Impaired balance/gait;Impaired mobility  No Fall Risks No Fall Risks No Fall Risks  Follow up Falls prevention discussed;Education provided;Falls evaluation completed Falls evaluation completed Falls evaluation completed Falls evaluation completed Falls prevention discussed    MEDICARE RISK AT HOME:  Medicare Risk at Home Any stairs in or around the home?: No If so, are there any without handrails?: No Home free of loose throw rugs in walkways, pet beds, electrical cords, etc?: Yes Adequate lighting in your home to reduce risk of falls?: Yes Life alert?: Yes Use of a cane, walker or w/c?: Yes Grab bars in the bathroom?: Yes Shower chair or bench in shower?:  No Elevated toilet seat or a handicapped toilet?: No  TIMED UP AND GO:  Was the test performed?  No  Cognitive Function: 6CIT completed    07/03/2022    1:33 PM  MMSE - Mini Mental State Exam  Not completed: Unable to complete        08/02/2024    2:32 PM 07/30/2023    1:36 PM 07/03/2022    1:33 PM 04/22/2019   10:29 AM 04/20/2018    1:17 PM  6CIT Screen  What Year? 0 points 0 points 0 points 0 points 0 points  What month? 0 points 0 points 0 points 0 points 0 points  What time? 0 points 0 points 0 points 0 points   Count back from 20 0 points 0 points 0 points 0 points 0 points  Months in reverse 0 points 0 points 2 points 0 points 0 points  Repeat phrase 0 points 0 points 0 points 0 points 0 points  Total Score 0 points 0 points 2 points 0 points     Immunizations Immunization History  Administered Date(s) Administered   Fluad Quad(high Dose 65+) 10/06/2019, 08/18/2020, 08/14/2021, 10/01/2022   Fluad Trivalent(High Dose 65+) 10/09/2023   H1N1 11/15/2008   Influenza Split 08/30/2011, 08/14/2012   Influenza Whole 09/15/2009, 09/26/2010   Influenza,inj,Quad PF,6+ Mos 10/18/2013, 08/25/2014, 12/13/2015, 08/05/2016, 09/22/2017, 07/28/2018   Moderna SARS-COV2 Booster Vaccination 08/25/2021, 08/25/2023   Moderna Sars-Covid-2 Vaccination 01/16/2020, 02/16/2020, 11/10/2020, 04/16/2021, 08/30/2022   Pneumococcal Conjugate-13 05/03/2015   Pneumococcal Polysaccharide-23 08/05/2016   Td 03/02/2004   Tdap 12/12/2011, 05/12/2023   Zoster Recombinant(Shingrix) 09/13/2019, 11/15/2019    Screening Tests Health Maintenance  Topic Date Due   COVID-19 Vaccine (8 - 2024-25 season) 10/20/2023   INFLUENZA VACCINE  07/09/2024   Colonoscopy  09/11/2024   MAMMOGRAM  07/26/2025   Medicare Annual Wellness (AWV)  08/02/2025   DEXA SCAN  07/26/2026   DTaP/Tdap/Td (4 - Td or Tdap) 05/11/2033   Pneumococcal Vaccine: 50+ Years  Completed   Hepatitis C Screening  Completed   Zoster Vaccines-  Shingrix  Completed   HPV VACCINES  Aged Out   Meningococcal B Vaccine  Aged Out    Health Maintenance  Health Maintenance Due  Topic Date Due   COVID-19 Vaccine (8 - 2024-25 season) 10/20/2023   INFLUENZA VACCINE  07/09/2024   Colonoscopy  09/11/2024   Health Maintenance Items Addressed: Patient has an appt with GI for repeat colonoscopy in October  Additional Screening:  Vision Screening: Recommended annual ophthalmology exams for early detection of glaucoma and other disorders of the eye. Would you like a referral to an eye doctor? No    Dental Screening: Recommended annual dental exams for proper oral hygiene  Community Resource Referral / Chronic Care Management: CRR required this visit?  No   CCM required this visit?  No   Plan:    I have personally reviewed and noted the following in the patient's chart:   Medical and social history Use of alcohol, tobacco or illicit drugs  Current medications and supplements including opioid prescriptions. Patient is not currently taking opioid prescriptions. Functional ability and status Nutritional status Physical activity Advanced directives List of other physicians Hospitalizations, surgeries, and ER visits in previous 12 months Vitals Screenings to include cognitive, depression, and falls Referrals and appointments  In addition, I have reviewed and discussed with patient certain preventive protocols, quality metrics, and best practice recommendations. A written personalized care plan for preventive services as well as general preventive health recommendations were provided to patient.   Olis Viverette, CMA   08/02/2024   After Visit Summary: (Mail) Due to this being a telephonic visit, the after visit summary with patients personalized plan was offered to patient via mail   Notes: Nothing significant to report at this time.

## 2024-08-04 ENCOUNTER — Other Ambulatory Visit: Payer: Self-pay | Admitting: Family Medicine

## 2024-08-04 DIAGNOSIS — J309 Allergic rhinitis, unspecified: Secondary | ICD-10-CM

## 2024-08-18 ENCOUNTER — Encounter (INDEPENDENT_AMBULATORY_CARE_PROVIDER_SITE_OTHER): Payer: Self-pay | Admitting: *Deleted

## 2024-08-31 ENCOUNTER — Other Ambulatory Visit: Payer: Self-pay | Admitting: Family Medicine

## 2024-08-31 DIAGNOSIS — J309 Allergic rhinitis, unspecified: Secondary | ICD-10-CM

## 2024-09-01 DIAGNOSIS — H548 Legal blindness, as defined in USA: Secondary | ICD-10-CM | POA: Diagnosis not present

## 2024-09-01 DIAGNOSIS — R7303 Prediabetes: Secondary | ICD-10-CM | POA: Diagnosis not present

## 2024-09-01 DIAGNOSIS — E669 Obesity, unspecified: Secondary | ICD-10-CM | POA: Diagnosis not present

## 2024-09-01 DIAGNOSIS — E039 Hypothyroidism, unspecified: Secondary | ICD-10-CM | POA: Diagnosis not present

## 2024-09-01 DIAGNOSIS — Z6831 Body mass index (BMI) 31.0-31.9, adult: Secondary | ICD-10-CM | POA: Diagnosis not present

## 2024-09-01 DIAGNOSIS — E785 Hyperlipidemia, unspecified: Secondary | ICD-10-CM | POA: Diagnosis not present

## 2024-09-01 DIAGNOSIS — M810 Age-related osteoporosis without current pathological fracture: Secondary | ICD-10-CM | POA: Diagnosis not present

## 2024-09-01 DIAGNOSIS — Z008 Encounter for other general examination: Secondary | ICD-10-CM | POA: Diagnosis not present

## 2024-09-01 DIAGNOSIS — I10 Essential (primary) hypertension: Secondary | ICD-10-CM | POA: Diagnosis not present

## 2024-09-01 DIAGNOSIS — R269 Unspecified abnormalities of gait and mobility: Secondary | ICD-10-CM | POA: Diagnosis not present

## 2024-09-01 DIAGNOSIS — M1990 Unspecified osteoarthritis, unspecified site: Secondary | ICD-10-CM | POA: Diagnosis not present

## 2024-09-01 DIAGNOSIS — F17211 Nicotine dependence, cigarettes, in remission: Secondary | ICD-10-CM | POA: Diagnosis not present

## 2024-09-01 DIAGNOSIS — F3341 Major depressive disorder, recurrent, in partial remission: Secondary | ICD-10-CM | POA: Diagnosis not present

## 2024-09-01 DIAGNOSIS — J4520 Mild intermittent asthma, uncomplicated: Secondary | ICD-10-CM | POA: Diagnosis not present

## 2024-09-13 ENCOUNTER — Telehealth: Payer: Self-pay

## 2024-09-13 NOTE — Telephone Encounter (Signed)
 Who is your primary care physician: Dr.Margaret Antonetta   Reasons for the colonoscopy: Hx polyps  Have you had a colonoscopy before?  Yes 09/11/2021  Do you have family history of colon cancer? no  Previous colonoscopy with polyps removed? yes  Do you have a history colorectal cancer?   no  Are you diabetic? If yes, Type 1 or Type 2?    no  Do you have a prosthetic or mechanical heart valve? no  Do you have a pacemaker/defibrillator?   no  Have you had endocarditis/atrial fibrillation? no  Have you had joint replacement within the last 12 months?  no  Do you tend to be constipated or have to use laxatives? yes  Do you have any history of drugs or alchohol?  no  Do you use supplemental oxygen?  no  Have you had a stroke or heart attack within the last 6 months? no  Do you take weight loss medication?  no  For female patients: have you had a hysterectomy?  yes                                     are you post menopausal?      no                                             do you still have your menstrual cycle? no    1983  Do you take any blood-thinning medications such as: (aspirin, warfarin, Plavix, Aggrenox)  no  If yes we need the name, milligram, dosage and who is prescribing doctor  Current Outpatient Medications on File Prior to Visit  Medication Sig Dispense Refill   albuterol  (VENTOLIN  HFA) 108 (90 Base) MCG/ACT inhaler INHALE 2 PUFFS INTO THE LUNGS EVERY 4 HOURS AS NEEDED FOR COUGHNG ANDWHEEZING. 18 g 3   alendronate  (FOSAMAX ) 70 MG tablet TAKE ONE TABLET BY MOUTH ON AN EMPTY STOMACH WITH A GLASS FULL OF WATER  ONCE A WEEK 4 tablet 0   amLODipine  (NORVASC ) 10 MG tablet TAKE 1 TABLET BY MOUTH AT BEDTIME. 30 tablet 0   betamethasone  dipropionate 0.05 % cream Apply topically 2 (two) times daily. Apply twice daily to rash on left elbow for 7 days, then as needed 45 g 0   calcium -vitamin D  (OSCAL-500) 500-400 MG-UNIT tablet Take 1 tablet by mouth 2 (two) times daily. 60  tablet 11   cetirizine  (ZYRTEC ) 10 MG tablet TAKE (1) TABLET BY MOUTH DAILY. 30 tablet 0   Cholecalciferol  (VITAMIN D3) 50 MCG (2000 UT) capsule Take 2,000 Units by mouth daily.     clotrimazole -betamethasone  (LOTRISONE ) cream Apply twice daily to rash on upper back for 10 days, then as needed 45 g 1   diclofenac  Sodium (VOLTAREN ) 1 % GEL Apply 2 gm once daily to affected areas 300 g 1   DULERA 100-5 MCG/ACT AERO INHALE 2 PUFFS INTO THE LUNGS TWICE DAILY. 13 g 0   fluticasone  (FLONASE ) 50 MCG/ACT nasal spray PLACE 1 SPRAY INTO EACH NOSTRIL DAILY. 16 g 0   gabapentin  (NEURONTIN ) 100 MG capsule Take 1 capsule (100 mg total) by mouth at bedtime. 30 capsule 5   levothyroxine  (SYNTHROID ) 112 MCG tablet Take 1 tablet (112 mcg total) by mouth daily before breakfast. 90 tablet 1   montelukast  (SINGULAIR )  10 MG tablet TAKE (1) TABLET BY MOUTH AT BEDTIME. 30 tablet 0   PARoxetine  (PAXIL ) 40 MG tablet TAKE (1) TABLET BY MOUTH EACH MORNING. 30 tablet 0   polyethylene glycol powder (GLYCOLAX /MIRALAX ) 17 GM/SCOOP powder Take 17 g by mouth 2 (two) times daily as needed. 3350 g 1   rosuvastatin  (CRESTOR ) 20 MG tablet Take 1 tablet (20 mg total) by mouth daily. 30 tablet 5   triamterene -hydrochlorothiazide (MAXZIDE-25) 37.5-25 MG tablet Take one tablet by mouth once daily for blood pressure 90 tablet 3   UNABLE TO FIND Med Name: Blood pressure monitoring kit with blood pressure cuff. DX I10 1 each 0   Vitamin D , Ergocalciferol , (DRISDOL ) 1.25 MG (50000 UNIT) CAPS capsule Take 1 capsule (50,000 Units total) by mouth every 7 (seven) days. 5 capsule 7   No current facility-administered medications on file prior to visit.    Allergies  Allergen Reactions   Lotensin Hct [Benazepril-Hydrochlorothiazide] Hives     Pharmacy: [facility]  Primary Insurance Name: [employer] [phone], [place] [id number]  Best number where you can be reached: [phone]

## 2024-09-23 ENCOUNTER — Ambulatory Visit: Admitting: Family Medicine

## 2024-09-24 NOTE — Telephone Encounter (Signed)
 ASA 2  Miralax  1-2 times daily for 3 days prior to prep.  Use same prep instructions as last time. Suprep with 1.5 days of clears.

## 2024-09-27 ENCOUNTER — Telehealth: Payer: Self-pay | Admitting: Internal Medicine

## 2024-09-27 ENCOUNTER — Other Ambulatory Visit: Payer: Self-pay | Admitting: *Deleted

## 2024-09-27 ENCOUNTER — Encounter: Payer: Self-pay | Admitting: *Deleted

## 2024-09-27 MED ORDER — NA SULFATE-K SULFATE-MG SULF 17.5-3.13-1.6 GM/177ML PO SOLN
ORAL | 0 refills | Status: AC
Start: 2024-09-27 — End: ?

## 2024-09-27 NOTE — Telephone Encounter (Signed)
 Questionnaire from recall, no referral needed

## 2024-09-27 NOTE — Telephone Encounter (Signed)
 Pt was scheduled for 10/26/24 with Dr.Carver. instructions mailed and pep sent to pharmacy.

## 2024-09-27 NOTE — Telephone Encounter (Signed)
 [name] came by office and said someone called [name] and she wants you to call her and give her the information.....  Her phone # is [phone]

## 2024-09-27 NOTE — Telephone Encounter (Signed)
 Pt has been scheduled for 10/26/24. Instructions  mailed and prep sent to pharmacy.

## 2024-09-27 NOTE — Telephone Encounter (Signed)
 LMTRC

## 2024-09-27 NOTE — Telephone Encounter (Signed)
 Pt left vm. LMTRC

## 2024-10-04 ENCOUNTER — Other Ambulatory Visit: Payer: Self-pay | Admitting: Family Medicine

## 2024-10-04 DIAGNOSIS — J309 Allergic rhinitis, unspecified: Secondary | ICD-10-CM

## 2024-10-21 ENCOUNTER — Other Ambulatory Visit: Payer: Self-pay | Admitting: "Endocrinology

## 2024-10-21 ENCOUNTER — Other Ambulatory Visit: Payer: Self-pay | Admitting: Family Medicine

## 2024-10-25 ENCOUNTER — Other Ambulatory Visit: Payer: Self-pay | Admitting: Family Medicine

## 2024-10-25 NOTE — Telephone Encounter (Unsigned)
 Copied from CRM #[id number]. Topic: Clinical - Medication Refill >> Oct 25, 2024 12:01 PM Jasmin G wrote: Medication:  rosuvastatin  (CRESTOR ) 20 MG tablet  Has the patient contacted their pharmacy? Yes (Agent: If no, request that the patient contact the pharmacy for the refill. If patient does not wish to contact the pharmacy document the reason why and proceed with request.) (Agent: If yes, when and what did the pharmacy advise?)  This is the patient's preferred pharmacy:  [facility] [place], [place] - [address] [place] [address] Phone: [phone] Fax: [phone]  Is this the correct pharmacy for this prescription? Yes If no, delete pharmacy and type the correct one.   Has the prescription been filled recently? Yes  Is the patient out of the medication? No  Has the patient been seen for an appointment in the last year OR does the patient have an upcoming appointment? Yes  Can we respond through MyChart? No  Agent: Please be advised that Rx refills may take up to 3 business days. We ask that you follow-up with your pharmacy.

## 2024-10-26 ENCOUNTER — Other Ambulatory Visit: Payer: Self-pay

## 2024-10-26 ENCOUNTER — Ambulatory Visit (HOSPITAL_COMMUNITY)
Admission: RE | Admit: 2024-10-26 | Discharge: 2024-10-26 | Disposition: A | Discharge status: Home / Self Care | Attending: Internal Medicine | Admitting: Internal Medicine

## 2024-10-26 ENCOUNTER — Encounter (HOSPITAL_COMMUNITY): Payer: Self-pay | Admitting: Internal Medicine

## 2024-10-26 ENCOUNTER — Ambulatory Visit (HOSPITAL_COMMUNITY): Admitting: Anesthesiology

## 2024-10-26 ENCOUNTER — Encounter (HOSPITAL_COMMUNITY)
Admission: RE | Disposition: A | Discharge status: Home / Self Care | Payer: Self-pay | Source: Home / Self Care | Attending: Internal Medicine

## 2024-10-26 DIAGNOSIS — Z87891 Personal history of nicotine dependence: Secondary | ICD-10-CM | POA: Insufficient documentation

## 2024-10-26 DIAGNOSIS — D122 Benign neoplasm of ascending colon: Secondary | ICD-10-CM

## 2024-10-26 DIAGNOSIS — Z1211 Encounter for screening for malignant neoplasm of colon: Secondary | ICD-10-CM | POA: Diagnosis not present

## 2024-10-26 DIAGNOSIS — Z79899 Other long term (current) drug therapy: Secondary | ICD-10-CM | POA: Diagnosis not present

## 2024-10-26 DIAGNOSIS — D120 Benign neoplasm of cecum: Secondary | ICD-10-CM

## 2024-10-26 DIAGNOSIS — D123 Benign neoplasm of transverse colon: Secondary | ICD-10-CM

## 2024-10-26 DIAGNOSIS — E039 Hypothyroidism, unspecified: Secondary | ICD-10-CM | POA: Insufficient documentation

## 2024-10-26 DIAGNOSIS — Z79890 Hormone replacement therapy (postmenopausal): Secondary | ICD-10-CM | POA: Diagnosis not present

## 2024-10-26 DIAGNOSIS — J45909 Unspecified asthma, uncomplicated: Secondary | ICD-10-CM | POA: Diagnosis not present

## 2024-10-26 DIAGNOSIS — K5730 Diverticulosis of large intestine without perforation or abscess without bleeding: Secondary | ICD-10-CM

## 2024-10-26 DIAGNOSIS — F32A Depression, unspecified: Secondary | ICD-10-CM | POA: Diagnosis not present

## 2024-10-26 DIAGNOSIS — K648 Other hemorrhoids: Secondary | ICD-10-CM | POA: Diagnosis not present

## 2024-10-26 DIAGNOSIS — M1990 Unspecified osteoarthritis, unspecified site: Secondary | ICD-10-CM | POA: Insufficient documentation

## 2024-10-26 DIAGNOSIS — I10 Essential (primary) hypertension: Secondary | ICD-10-CM | POA: Insufficient documentation

## 2024-10-26 DIAGNOSIS — K219 Gastro-esophageal reflux disease without esophagitis: Secondary | ICD-10-CM | POA: Insufficient documentation

## 2024-10-26 DIAGNOSIS — F419 Anxiety disorder, unspecified: Secondary | ICD-10-CM | POA: Diagnosis not present

## 2024-10-26 DIAGNOSIS — Z860101 Personal history of adenomatous and serrated colon polyps: Secondary | ICD-10-CM

## 2024-10-26 SURGERY — COLONOSCOPY
Anesthesia: General

## 2024-10-26 MED ORDER — LIDOCAINE 2% (20 MG/ML) 5 ML SYRINGE
INTRAMUSCULAR | Status: DC | PRN
  Administered 2024-10-26: 60 mg via INTRAVENOUS

## 2024-10-26 MED ORDER — EPHEDRINE SULFATE (PRESSORS) 25 MG/5ML IV SOSY
PREFILLED_SYRINGE | INTRAVENOUS | Status: DC | PRN
  Administered 2024-10-26: 5 mg via INTRAVENOUS
  Administered 2024-10-26: 10 mg via INTRAVENOUS

## 2024-10-26 MED ORDER — LACTATED RINGERS IV SOLN: INTRAVENOUS | Status: DC

## 2024-10-26 MED ORDER — PROPOFOL 10 MG/ML IV BOLUS
INTRAVENOUS | Status: DC | PRN
Start: 2024-10-26 — End: 2024-10-26
  Administered 2024-10-26: 125 ug/kg/min via INTRAVENOUS
  Administered 2024-10-26: 70 mg via INTRAVENOUS

## 2024-10-26 MED ORDER — PHENYLEPHRINE 80 MCG/ML (10ML) SYRINGE FOR IV PUSH (FOR BLOOD PRESSURE SUPPORT)
PREFILLED_SYRINGE | INTRAVENOUS | Status: DC | PRN
  Administered 2024-10-26: 160 ug via INTRAVENOUS

## 2024-10-26 NOTE — Anesthesia Postprocedure Evaluation (Signed)
 Anesthesia Post Note  Patient: [name]  Procedure(s) Performed: COLONOSCOPY  Patient location during evaluation: Endoscopy Anesthesia Type: General Level of consciousness: awake and alert Pain management: pain level controlled Vital Signs Assessment: post-procedure vital signs reviewed and stable Respiratory status: spontaneous breathing, nonlabored ventilation and respiratory function stable Cardiovascular status: stable Anesthetic complications: no   There were no known notable events for this encounter.   Last Vitals:  Vitals:   10/26/24 0859 10/26/24 1052  BP: (!) 151/72 128/67  Pulse: 80 78  Resp: 17 20  Temp: 36.4 C 36.4 C  SpO2: 97% 100%    Last Pain:  Vitals:   10/26/24 1052  TempSrc: Oral  PainSc: 0-No pain                 Nell Schrack L Curry Seefeldt

## 2024-10-26 NOTE — Op Note (Signed)
 [facility] Patient Name: [name] Procedure Date: 10/26/2024 9:59 AM MRN: [id number] Date of Birth: [birth year] Attending MD: Carlin POUR. Cindie , [place], [id number] CSN: [id number] Age: 73 Admit Type: Outpatient Procedure:                Colonoscopy Indications:              Surveillance: Personal history of colonic polyps                            (unknown histology) on last colonoscopy 3 years ago Providers:                Carlin POUR. Cindie, DO, Harlene Lips, Devere Lodge Referring MD:              Medicines:                See the Anesthesia note for documentation of the                            administered medications Complications:            No immediate complications. Estimated Blood Loss:     Estimated blood loss was minimal. Procedure:                Pre-Anesthesia Assessment:                           - The anesthesia plan was to use monitored                            anesthesia care (MAC).                           After obtaining informed consent, the colonoscope                            was passed under direct vision. Throughout the                            procedure, the patient's blood pressure, pulse, and                            oxygen saturations were monitored continuously. The                            PCF-HQ190L ([id number]) Peds Colon was introduced                            through the anus and advanced to the the cecum,                            identified by appendiceal orifice and ileocecal                            valve. The colonoscopy was performed without  difficulty. The patient tolerated the procedure                            well. The quality of the bowel preparation was                            evaluated using the BBPS [facility] Bowel Preparation                            Scale) with scores of: Right Colon = 2 (minor                            amount of residual staining, small fragments of                             stool and/or opaque liquid, but mucosa seen well),                            Transverse Colon = 2 (minor amount of residual                            staining, small fragments of stool and/or opaque                            liquid, but mucosa seen well) and Left Colon = 2                            (minor amount of residual staining, small fragments                            of stool and/or opaque liquid, but mucosa seen                            well). The total BBPS score equals 6. The quality                            of the bowel preparation was good. Scope In: 10:17:10 AM Scope Out: 10:43:07 AM Scope Withdrawal Time: 0 hours 20 minutes 1 second  Total Procedure Duration: 0 hours 25 minutes 57 seconds  Findings:      Non-bleeding internal hemorrhoids were found.      Multiple large-mouthed and small-mouthed diverticula were found in the       sigmoid colon.      Five sessile polyps were found in the ascending colon and cecum. The       polyps were 3 to 6 mm in size. These polyps were removed with a cold       snare. Resection and retrieval were complete.      Three sessile polyps were found in the transverse colon. The polyps were       5 to 7 mm in size. These polyps were removed with a cold snare.       Resection and retrieval were complete. Impression:               -  Non-bleeding internal hemorrhoids.                           - Diverticulosis in the sigmoid colon.                           - Five 3 to 6 mm polyps in the ascending colon and                            in the cecum, removed with a cold snare. Resected                            and retrieved.                           - Three 5 to 7 mm polyps in the transverse colon,                            removed with a cold snare. Resected and retrieved. Moderate Sedation:      Per Anesthesia Care Recommendation:           - Patient has a contact number available for                             emergencies. The signs and symptoms of potential                            delayed complications were discussed with the                            patient. Return to normal activities tomorrow.                            Written discharge instructions were provided to the                            patient.                           - Resume previous diet.                           - Continue present medications.                           - Await pathology results.                           - Repeat colonoscopy in 3 years for surveillance.                           - Return to GI clinic PRN. Procedure Code(s):        --- Professional ---                           [phone], Colonoscopy, flexible; with  removal of                            tumor(s), polyp(s), or other lesion(s) by snare                            technique Diagnosis Code(s):        --- Professional ---                           Z86.010, Personal history of colonic polyps                           K64.8, Other hemorrhoids                           D12.2, Benign neoplasm of ascending colon                           D12.0, Benign neoplasm of cecum                           D12.3, Benign neoplasm of transverse colon (hepatic                            flexure or splenic flexure)                           K57.30, Diverticulosis of large intestine without                            perforation or abscess without bleeding CPT copyright 2022 American Medical Association. All rights reserved. The codes documented in this report are preliminary and upon coder review may  be revised to meet current compliance requirements. Carlin POUR. Cindie, DO Carlin POUR. Cindie, DO 10/26/2024 10:47:32 AM This report has been signed electronically. Number of Addenda: 0

## 2024-10-26 NOTE — Anesthesia Preprocedure Evaluation (Addendum)
 Anesthesia Evaluation  Patient identified by MRN, date of birth, ID band Patient awake    Reviewed: Allergy & Precautions, H&P , NPO status , Patient's Chart, lab work & pertinent test results, reviewed documented beta blocker date and time   Airway Mallampati: II  TM Distance: >3 FB Neck ROM: full    Dental  (+) Dental Advisory Given, Missing,  Many missing upper teeth, mostly on one side:   Pulmonary asthma , former smoker   Pulmonary exam normal breath sounds clear to auscultation       Cardiovascular Exercise Tolerance: Good hypertension, Normal cardiovascular exam Rhythm:regular Rate:Normal     Neuro/Psych  Headaches PSYCHIATRIC DISORDERS Anxiety Depression     Neuromuscular disease    GI/Hepatic Neg liver ROS,GERD  Medicated,,  Endo/Other  Hypothyroidism    Renal/GU negative Renal ROS  negative genitourinary   Musculoskeletal  (+) Arthritis , Osteoarthritis,    Abdominal   Peds  Hematology negative hematology ROS (+)   Anesthesia Other Findings   Reproductive/Obstetrics negative OB ROS                              Anesthesia Physical Anesthesia Plan  ASA: 2  Anesthesia Plan: General   Post-op Pain Management: Minimal or no pain anticipated   Induction: Intravenous  PONV Risk Score and Plan: Propofol  infusion  Airway Management Planned: Natural Airway and Nasal Cannula  Additional Equipment: None  Intra-op Plan:   Post-operative Plan:   Informed Consent: I have reviewed the patients History and Physical, chart, labs and discussed the procedure including the risks, benefits and alternatives for the proposed anesthesia with the patient or authorized representative who has indicated his/her understanding and acceptance.     Dental Advisory Given  Plan Discussed with: CRNA  Anesthesia Plan Comments:          Anesthesia Quick Evaluation

## 2024-10-26 NOTE — Anesthesia Procedure Notes (Signed)
 Date/Time: 10/26/2024 10:11 AM  Performed by: Para Jerelene CROME, CRNAOxygen Delivery Method: Nasal cannula

## 2024-10-26 NOTE — H&P (Signed)
 Primary Care Physician:  Antonetta Rollene BRAVO, MD Primary Gastroenterologist:  Dr. Cindie  Pre-Procedure History & Physical: HPI:  [name] is a 73 y.o. female is here for a colonoscopy to be performed for surveillance purposes, personal history of adenomatous colon polyps in 2022  Past Medical History:  Diagnosis Date   Allergy    Anxiety    Arthritis    Asthma    Hyperlipidemia    Hypertension    Vision abnormalities    states close to blindness due to cataracts    Past Surgical History:  Procedure Laterality Date   caesarean     cataracts     CESAREAN SECTION     COLONOSCOPY WITH PROPOFOL  N/A 09/11/2021   Procedure: COLONOSCOPY WITH PROPOFOL ;  Surgeon: Cindie Carlin POUR, DO;  Location: [facility];  Service: Endoscopy;  Laterality: N/A;  10:30 / ASA II   POLYPECTOMY  09/11/2021   Procedure: POLYPECTOMY;  Surgeon: Cindie Carlin POUR, DO;  Location: [facility];  Service: Endoscopy;;   TOTAL ABDOMINAL HYSTERECTOMY     prolapse    Prior to Admission medications   Medication Sig Start Date End Date Taking? Authorizing Provider  albuterol  (VENTOLIN  HFA) 108 (90 Base) MCG/ACT inhaler INHALE 2 PUFFS INTO THE LUNGS EVERY 4 HOURS AS NEEDED FOR COUGHNG ANDWHEEZING. 02/13/23  Yes Antonetta Rollene BRAVO, MD  amLODipine  (NORVASC ) 10 MG tablet TAKE 1 TABLET BY MOUTH AT BEDTIME. 10/04/24  Yes Antonetta Rollene BRAVO, MD  betamethasone  dipropionate 0.05 % cream Apply topically 2 (two) times daily. Apply twice daily to rash on left elbow for 7 days, then as needed 11/15/20  Yes Antonetta Rollene BRAVO, MD  calcium -vitamin D  (OSCAL-500) 500-400 MG-UNIT tablet Take 1 tablet by mouth 2 (two) times daily. 07/07/20  Yes Antonetta Rollene BRAVO, MD  cetirizine  (ZYRTEC ) 10 MG tablet TAKE (1) TABLET BY MOUTH DAILY. 10/04/24  Yes Antonetta Rollene BRAVO, MD  Cholecalciferol  (VITAMIN D3) 50 MCG (2000 UT) capsule Take 2,000 Units by mouth daily.   Yes [provider]  clotrimazole -betamethasone  (LOTRISONE )  cream Apply twice daily to rash on upper back for 10 days, then as needed 04/23/22  Yes Antonetta Rollene BRAVO, MD  diclofenac  Sodium (VOLTAREN ) 1 % GEL Apply 2 gm once daily to affected areas 10/09/23  Yes Antonetta Rollene BRAVO, MD  DULERA 100-5 MCG/ACT AERO INHALE 2 PUFFS INTO THE LUNGS TWICE DAILY. 08/31/24  Yes Antonetta Rollene BRAVO, MD  fluticasone  (FLONASE ) 50 MCG/ACT nasal spray PLACE 1 SPRAY INTO EACH NOSTRIL DAILY. 10/04/24  Yes Antonetta Rollene BRAVO, MD  gabapentin  (NEURONTIN ) 100 MG capsule Take 1 capsule (100 mg total) by mouth at bedtime. 10/21/24  Yes Antonetta Rollene BRAVO, MD  levothyroxine  (SYNTHROID ) 112 MCG tablet TAKE ONE TABLET BY MOUTH DAILY BEFORE BREAKFAST 10/21/24  Yes Nida, Gebreselassie W, MD  montelukast  (SINGULAIR ) 10 MG tablet TAKE (1) TABLET BY MOUTH AT BEDTIME. 10/04/24  Yes Antonetta Rollene BRAVO, MD  Na Sulfate-K Sulfate-Mg Sulfate concentrate (SUPREP) 17.5-3.13-1.6 GM/177ML SOLN As directed 09/27/24  Yes Cindie Carlin K, DO  PARoxetine  (PAXIL ) 40 MG tablet TAKE (1) TABLET BY MOUTH EACH MORNING. 10/04/24  Yes Antonetta Rollene BRAVO, MD  rosuvastatin  (CRESTOR ) 20 MG tablet Take 1 tablet (20 mg total) by mouth daily. (stop lovastatin ) 10/21/24  Yes Antonetta Rollene BRAVO, MD  triamterene -hydrochlorothiazide (MAXZIDE-25) 37.5-25 MG tablet TAKE ONE TABLET BY MOUTH EVERY DAY FOR BLOOD PRESSURE 10/21/24  Yes Antonetta Rollene BRAVO, MD  alendronate  (FOSAMAX ) 70 MG tablet TAKE ONE TABLET BY MOUTH  ON AN EMPTY STOMACH WITH A GLASS FULL OF WATER  ONCE A WEEK 10/04/24   Antonetta Rollene BRAVO, MD  polyethylene glycol powder (GLYCOLAX /MIRALAX ) 17 GM/SCOOP powder Take 17 g by mouth 2 (two) times daily as needed. 07/10/21   Antonetta Rollene BRAVO, MD  UNABLE TO FIND Med Name: Blood pressure monitoring kit with blood pressure cuff. DX I10 04/23/24   Antonetta Rollene BRAVO, MD  Vitamin D , Ergocalciferol , (DRISDOL ) 1.25 MG (50000 UNIT) CAPS capsule Take 1 capsule (50,000 Units total) by mouth every 7 (seven) days. 04/23/24    Antonetta Rollene BRAVO, MD    Allergies as of 09/27/2024 - Review Complete 09/13/2024  Allergen Reaction Noted   Lotensin hct [benazepril-hydrochlorothiazide] Hives 05/01/2011    Family History  Problem Relation Age of Onset   Cancer Mother 87       stomach   Kidney disease Mother    Hypertension Mother    Diabetes Father    Cancer Father        lung    Diabetes Sister    Hyperlipidemia Sister    Diabetes Sister    Diabetes Sister    Cancer Daughter 85       cervix   Alcohol abuse Brother    Diabetes Brother    Diabetes Brother    Diabetes Brother    Diabetes Brother    Arthritis Other    Asthma Other    Breast cancer Neg Hx     Social History   Socioeconomic History   Marital status: Single    Spouse name: Not on file   Number of children: Not on file   Years of education: 12th grade   Highest education level: Not on file  Occupational History   Occupation: disabled    Employer: NOT EMPLOYED  Tobacco Use   Smoking status: Former    Current packs/day: 0.00    Average packs/day: 0.5 packs/day for 18.0 years (9.0 ttl pk-yrs)    Types: Cigarettes    Start date: 61    Quit date: 1989    Years since quitting: 36.9   Smokeless tobacco: Never  Vaping Use   Vaping status: Never Used  Substance and Sexual Activity   Alcohol use: No   Drug use: No   Sexual activity: Not Currently  Other Topics Concern   Not on file  Social History Narrative   Not on file   Social Drivers of Health   Financial Resource Strain: Low Risk  (08/02/2024)   Overall Financial Resource Strain (CARDIA)    Difficulty of Paying Living Expenses: Not hard at all  Food Insecurity: No Food Insecurity (08/02/2024)   Hunger Vital Sign    Worried About Running Out of Food in the Last Year: Never true    Ran Out of Food in the Last Year: Never true  Transportation Needs: No Transportation Needs (08/02/2024)   PRAPARE - Administrator, Civil Service (Medical): No    Lack of  Transportation (Non-Medical): No  Physical Activity: Inactive (08/02/2024)   Exercise Vital Sign    Days of Exercise per Week: 0 days    Minutes of Exercise per Session: 0 min  Stress: No Stress Concern Present (08/02/2024)   [employer] of Occupational Health - Occupational Stress Questionnaire    Feeling of Stress: Not at all  Social Connections: Socially Isolated (08/02/2024)   Social Connection and Isolation Panel    Frequency of Communication with Friends and Family: More than three times a week  Frequency of Social Gatherings with Friends and Family: More than three times a week    Attends Religious Services: Never    Database Administrator or Organizations: No    Attends Banker Meetings: Never    Marital Status: Never married  Intimate Partner Violence: Not At Risk (08/02/2024)   Humiliation, Afraid, Rape, and Kick questionnaire    Fear of Current or Ex-Partner: No    Emotionally Abused: No    Physically Abused: No    Sexually Abused: No    Review of Systems: See HPI, otherwise negative ROS  Physical Exam: Vital signs in last 24 hours: Temp:  [97.6 F (36.4 C)] 97.6 F (36.4 C) (11/18 0859) Pulse Rate:  [80] 80 (11/18 0859) Resp:  [17] 17 (11/18 0859) BP: (151)/(72) 151/72 (11/18 0859) SpO2:  [97 %] 97 % (11/18 0859) Weight:  [91.6 kg] 91.6 kg (11/18 0859)   General:   Alert,  Well-developed, well-nourished, pleasant and cooperative in NAD Head:  Normocephalic and atraumatic. Eyes:  Sclera clear, no icterus.   Conjunctiva pink. Ears:  Normal auditory acuity. Nose:  No deformity, discharge,  or lesions. Msk:  Symmetrical without gross deformities. Normal posture. Extremities:  Without clubbing or edema. Neurologic:  Alert and  oriented x4;  grossly normal neurologically. Skin:  Intact without significant lesions or rashes. Psych:  Alert and cooperative. Normal mood and affect.  Impression/Plan: [name] is here for a colonoscopy to be  performed for surveillance purposes, personal history of adenomatous colon polyps in 2022  The risks of the procedure including infection, bleed, or perforation as well as benefits, limitations, alternatives and imponderables have been reviewed with the patient. Questions have been answered. All parties agreeable.

## 2024-10-26 NOTE — Transfer of Care (Addendum)
 Immediate Anesthesia Transfer of Care Note  Patient: [name]  Procedure(s) Performed: COLONOSCOPY  Patient Location: Endoscopy Unit  Anesthesia Type:General  Level of Consciousness: drowsy and patient cooperative  Airway & Oxygen Therapy: Patient Spontanous Breathing  Post-op Assessment: Report given to RN and Post -op Vital signs reviewed and stable  Post vital signs: Reviewed and stable  Last Vitals:  Vitals Value Taken Time  BP 128/67 10/26/24   1052  Temp 36.4 10/26/24   1052  Pulse 78 10/26/24   1052  Resp 20 10/26/24   1052  SpO2 100% 10/26/24   1052    Last Pain:  Vitals:   10/26/24 1011  TempSrc:   PainSc: 0-No pain      Patients Stated Pain Goal: 8 (10/26/24 0859)  Complications: No notable events documented.

## 2024-10-26 NOTE — Discharge Instructions (Signed)
  Colonoscopy Discharge Instructions  Read the instructions outlined below and refer to this sheet in the next few weeks. These discharge instructions provide you with general information on caring for yourself after you leave the hospital. Your doctor may also give you specific instructions. While your treatment has been planned according to the most current medical practices available, unavoidable complications occasionally occur.   ACTIVITY You may resume your regular activity, but move at a slower pace for the next 24 hours.  Take frequent rest periods for the next 24 hours.  Walking will help get rid of the air and reduce the bloated feeling in your belly (abdomen).  No driving for 24 hours (because of the medicine (anesthesia) used during the test).   Do not sign any important legal documents or operate any machinery for 24 hours (because of the anesthesia used during the test).  NUTRITION Drink plenty of fluids.  You may resume your normal diet as instructed by your doctor.  Begin with a light meal and progress to your normal diet. Heavy or fried foods are harder to digest and may make you feel sick to your stomach (nauseated).  Avoid alcoholic beverages for 24 hours or as instructed.  MEDICATIONS You may resume your normal medications unless your doctor tells you otherwise.  WHAT YOU CAN EXPECT TODAY Some feelings of bloating in the abdomen.  Passage of more gas than usual.  Spotting of blood in your stool or on the toilet paper.  IF YOU HAD POLYPS REMOVED DURING THE COLONOSCOPY: No aspirin products for 7 days or as instructed.  No alcohol for 7 days or as instructed.  Eat a soft diet for the next 24 hours.  FINDING OUT THE RESULTS OF YOUR TEST Not all test results are available during your visit. If your test results are not back during the visit, make an appointment with your caregiver to find out the results. Do not assume everything is normal if you have not heard from your  caregiver or the medical facility. It is important for you to follow up on all of your test results.  SEEK IMMEDIATE MEDICAL ATTENTION IF: You have more than a spotting of blood in your stool.  Your belly is swollen (abdominal distention).  You are nauseated or vomiting.  You have a temperature over 101.  You have abdominal pain or discomfort that is severe or gets worse throughout the day.   Your colonoscopy revealed 8 small polyp(s) which I removed successfully. Await pathology results, my office will contact you. I recommend repeating colonoscopy in 3 years for surveillance purposes, depending on pathology results.  You also have diverticulosis and internal hemorrhoids. I would recommend increasing fiber in your diet or adding OTC Benefiber/Metamucil. Be sure to drink at least 4 to 6 glasses of water  daily. Follow-up with GI as needed.   I hope you have a great rest of your week!  Carlin POUR. Cindie, D.O. Gastroenterology and Hepatology [facility]

## 2024-10-27 LAB — SURGICAL PATHOLOGY

## 2024-10-29 ENCOUNTER — Encounter (HOSPITAL_COMMUNITY): Payer: Self-pay | Admitting: Internal Medicine

## 2024-11-02 ENCOUNTER — Other Ambulatory Visit: Payer: Self-pay | Admitting: Family Medicine

## 2024-11-09 ENCOUNTER — Ambulatory Visit (INDEPENDENT_AMBULATORY_CARE_PROVIDER_SITE_OTHER): Admitting: "Endocrinology

## 2024-11-09 ENCOUNTER — Encounter: Payer: Self-pay | Admitting: "Endocrinology

## 2024-11-09 VITALS — BP 142/80 | HR 72 | Ht 67.0 in | Wt 199.2 lb

## 2024-11-09 DIAGNOSIS — E782 Mixed hyperlipidemia: Secondary | ICD-10-CM

## 2024-11-09 DIAGNOSIS — R7303 Prediabetes: Secondary | ICD-10-CM

## 2024-11-09 DIAGNOSIS — E039 Hypothyroidism, unspecified: Secondary | ICD-10-CM

## 2024-11-09 LAB — POCT GLYCOSYLATED HEMOGLOBIN (HGB A1C)

## 2024-11-09 LAB — LIPID PANEL
Chol/HDL Ratio: 2 ratio (ref 0.0–4.4)
Cholesterol, Total: 146 mg/dL (ref 100–199)
HDL: 73 mg/dL (ref >39)
LDL Chol Calc (NIH): 59 mg/dL (ref 0–99)
Triglycerides: 73 mg/dL (ref 0–149)
VLDL Cholesterol Cal: 14 mg/dL (ref 5–40)

## 2024-11-09 LAB — T4, FREE: Free T4: 1.24 ng/dL (ref 0.82–1.77)

## 2024-11-09 LAB — TSH: TSH: 3.38 u[IU]/mL (ref 0.450–4.500)

## 2024-11-09 NOTE — Progress Notes (Signed)
 11/09/2024  Endocrinology follow-up note   Subjective:    Patient ID: [name], female    DOB: September 10, 1951, PCP Antonetta Rollene BRAVO, MD   Past Medical History:  Diagnosis Date   Allergy    Anxiety    Arthritis    Asthma    Hyperlipidemia    Hypertension    Vision abnormalities    states close to blindness due to cataracts   Past Surgical History:  Procedure Laterality Date   caesarean     cataracts     CESAREAN SECTION     COLONOSCOPY N/A 10/26/2024   Procedure: COLONOSCOPY;  Surgeon: Cindie Carlin POUR, DO;  [facility];  Service: Endoscopy;  Laterality: N/A;  10:45 am, asa 2   COLONOSCOPY WITH PROPOFOL  N/A 09/11/2021   Procedure: COLONOSCOPY WITH PROPOFOL ;  Surgeon: Cindie Carlin POUR, DO;  [facility];  Service: Endoscopy;  Laterality: N/A;  10:30 / ASA II   POLYPECTOMY  09/11/2021   Procedure: POLYPECTOMY;  Surgeon: Cindie Carlin POUR, DO;  [facility];  Service: Endoscopy;;   TOTAL ABDOMINAL HYSTERECTOMY     prolapse   Social History   Socioeconomic History   Marital status: Single    Spouse name: Not on file   Number of children: Not on file   Years of education: 12th grade   Highest education level: Not on file  Occupational History   Occupation: disabled    Employer: NOT EMPLOYED  Tobacco Use   Smoking status: Former    Current packs/day: 0.00    Average packs/day: 0.5 packs/day for 18.0 years (9.0 ttl pk-yrs)    Types: Cigarettes    Start date: 36    Quit date: 1989    Years since quitting: 36.9   Smokeless tobacco: Never  Vaping Use   Vaping status: Never Used  Substance and Sexual Activity   Alcohol use: No   Drug use: No   Sexual activity: Not Currently  Other Topics Concern   Not on file  Social History Narrative   Not on file   Social Drivers of Health   Financial Resource Strain: Low Risk  (08/02/2024)   Overall Financial Resource Strain (CARDIA)    Difficulty of Paying Living Expenses: Not  hard at all  Food Insecurity: No Food Insecurity (08/02/2024)   Hunger Vital Sign    Worried About Running Out of Food in the Last Year: Never true    Ran Out of Food in the Last Year: Never true  Transportation Needs: No Transportation Needs (08/02/2024)   PRAPARE - Administrator, Civil Service (Medical): No    Lack of Transportation (Non-Medical): No  Physical Activity: Inactive (08/02/2024)   Exercise Vital Sign    Days of Exercise per Week: 0 days    Minutes of Exercise per Session: 0 min  Stress: No Stress Concern Present (08/02/2024)   [employer] of Occupational Health - Occupational Stress Questionnaire    Feeling of Stress: Not at all  Social Connections: Socially Isolated (08/02/2024)   Social Connection and Isolation Panel    Frequency of Communication with Friends and Family: More than three times a week    Frequency of Social Gatherings with Friends and Family: More than three times a week    Attends Religious Services: Never    Database Administrator or Organizations: No    Attends Banker Meetings: Never    Marital Status: Never married   Outpatient  Encounter Medications as of 11/09/2024  Medication Sig   albuterol  (VENTOLIN  HFA) 108 (90 Base) MCG/ACT inhaler INHALE 2 PUFFS INTO THE LUNGS EVERY 4 HOURS AS NEEDED FOR COUGHNG ANDWHEEZING.   alendronate  (FOSAMAX ) 70 MG tablet TAKE ONE TABLET BY MOUTH ON AN EMPTY STOMACH WITH A GLASS FULL OF WATER  ONCE A WEEK   amLODipine  (NORVASC ) 10 MG tablet TAKE 1 TABLET BY MOUTH AT BEDTIME.   betamethasone  dipropionate 0.05 % cream Apply topically 2 (two) times daily. Apply twice daily to rash on left elbow for 7 days, then as needed   calcium -vitamin D  (OSCAL-500) 500-400 MG-UNIT tablet Take 1 tablet by mouth 2 (two) times daily.   cetirizine  (ZYRTEC ) 10 MG tablet TAKE (1) TABLET BY MOUTH DAILY.   Cholecalciferol  (VITAMIN D3) 50 MCG (2000 UT) capsule Take 2,000 Units by mouth daily.    clotrimazole -betamethasone  (LOTRISONE ) cream Apply twice daily to rash on upper back for 10 days, then as needed   diclofenac  Sodium (VOLTAREN ) 1 % GEL Apply 2 gm once daily to affected areas   DULERA 100-5 MCG/ACT AERO INHALE 2 PUFFS INTO THE LUNGS TWICE DAILY.   fluticasone  (FLONASE ) 50 MCG/ACT nasal spray PLACE 1 SPRAY INTO EACH NOSTRIL DAILY.   gabapentin  (NEURONTIN ) 100 MG capsule Take 1 capsule (100 mg total) by mouth at bedtime.   levothyroxine  (SYNTHROID ) 112 MCG tablet TAKE ONE TABLET BY MOUTH DAILY BEFORE BREAKFAST   montelukast  (SINGULAIR ) 10 MG tablet TAKE (1) TABLET BY MOUTH AT BEDTIME.   Na Sulfate-K Sulfate-Mg Sulfate concentrate (SUPREP) 17.5-3.13-1.6 GM/177ML SOLN As directed   PARoxetine  (PAXIL ) 40 MG tablet TAKE (1) TABLET BY MOUTH EACH MORNING.   polyethylene glycol powder (GLYCOLAX /MIRALAX ) 17 GM/SCOOP powder Take 17 g by mouth 2 (two) times daily as needed.   rosuvastatin  (CRESTOR ) 20 MG tablet Take 1 tablet (20 mg total) by mouth daily. (stop lovastatin )   triamterene -hydrochlorothiazide (MAXZIDE-25) 37.5-25 MG tablet TAKE ONE TABLET BY MOUTH EVERY DAY FOR BLOOD PRESSURE   UNABLE TO FIND Med Name: Blood pressure monitoring kit with blood pressure cuff. DX I10   Vitamin D , Ergocalciferol , (DRISDOL ) 1.25 MG (50000 UNIT) CAPS capsule Take 1 capsule (50,000 Units total) by mouth every 7 (seven) days.   No facility-administered encounter medications on file as of 11/09/2024.   ALLERGIES: Allergies  Allergen Reactions   Lotensin Hct [Benazepril-Hydrochlorothiazide] Hives    VACCINATION STATUS: Immunization History  Administered Date(s) Administered   Fluad Quad(high Dose 65+) 10/06/2019, 08/18/2020, 08/14/2021, 10/01/2022   Fluad Trivalent(High Dose 65+) 10/09/2023   H1N1 11/15/2008   Influenza Split 08/30/2011, 08/14/2012   Influenza Whole 09/15/2009, 09/26/2010   Influenza,inj,Quad PF,6+ Mos 10/18/2013, 08/25/2014, 12/13/2015, 08/05/2016, 09/22/2017, 07/28/2018    Moderna SARS-COV2 Booster Vaccination 08/25/2021, 08/25/2023   Moderna Sars-Covid-2 Vaccination 01/16/2020, 02/16/2020, 11/10/2020, 04/16/2021, 08/30/2022   Pneumococcal Conjugate-13 05/03/2015   Pneumococcal Polysaccharide-23 08/05/2016   Td 03/02/2004   Tdap 12/12/2011, 05/12/2023   Zoster Recombinant(Shingrix) 09/13/2019, 11/15/2019    HPI 73 year old female patient with medical history as above.  She is returning for follow-up with repeat thyroid  function test for follow-up of hypothyroidism, hyperlipidemia, prediabetes. She is currently on stable dose of levothyroxine  112 mcg p.o. daily before breakfast.  She reports better compliance.  Her previsit thyroid  function tests are consistent with appropriate replacement.      She has no new complaints today.  She presents with mildly fluctuating body weight.  - She is a poor historian accompanied by her sister.  - She denies dysphagia, shortness of breath, nor voice  change. She had  a series of thyroid  ultrasound studies, last one from 05/02/2017 showing 1 cm solitary nodule on the left lobe of the thyroid  .  Her most recent surveillance thyroid  ultrasound in 2021 reveals the same finding, unchanged. - Ultrasound description of the thyroid  summarized below. 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  Her point-of-care A1c is higher today at 6.4% progressively increasing from 5.8%.   still consistent with prediabetes.   She is on Crestor  for hyperlipidemia.      Current Outpatient Medications:    albuterol  (VENTOLIN  HFA) 108 (90 Base) MCG/ACT inhaler, INHALE 2 PUFFS INTO THE LUNGS EVERY 4 HOURS AS NEEDED FOR COUGHNG ANDWHEEZING., Disp: 18 g, Rfl: 3   alendronate  (FOSAMAX ) 70 MG tablet, TAKE ONE TABLET BY MOUTH ON AN EMPTY STOMACH WITH A GLASS FULL OF WATER  ONCE A WEEK, Disp: 4 tablet, Rfl: 0   amLODipine  (NORVASC ) 10 MG tablet, TAKE 1 TABLET BY MOUTH AT BEDTIME., Disp: 30 tablet,  Rfl: 0   betamethasone  dipropionate 0.05 % cream, Apply topically 2 (two) times daily. Apply twice daily to rash on left elbow for 7 days, then as needed, Disp: 45 g, Rfl: 0   calcium -vitamin D  (OSCAL-500) 500-400 MG-UNIT tablet, Take 1 tablet by mouth 2 (two) times daily., Disp: 60 tablet, Rfl: 11   cetirizine  (ZYRTEC ) 10 MG tablet, TAKE (1) TABLET BY MOUTH DAILY., Disp: 30 tablet, Rfl: 0   Cholecalciferol  (VITAMIN D3) 50 MCG (2000 UT) capsule, Take 2,000 Units by mouth daily., Disp: , Rfl:    clotrimazole -betamethasone  (LOTRISONE ) cream, Apply twice daily to rash on upper back for 10 days, then as needed, Disp: 45 g, Rfl: 1   diclofenac  Sodium (VOLTAREN ) 1 % GEL, Apply 2 gm once daily to affected areas, Disp: 300 g, Rfl: 1   DULERA 100-5 MCG/ACT AERO, INHALE 2 PUFFS INTO THE LUNGS TWICE DAILY., Disp: 13 g, Rfl: 0   fluticasone  (FLONASE ) 50 MCG/ACT nasal spray, PLACE 1 SPRAY INTO EACH NOSTRIL DAILY., Disp: 16 g, Rfl: 0   gabapentin  (NEURONTIN ) 100 MG capsule, Take 1 capsule (100 mg total) by mouth at bedtime., Disp: 90 capsule, Rfl: 0   levothyroxine  (SYNTHROID ) 112 MCG tablet, TAKE ONE TABLET BY MOUTH DAILY BEFORE BREAKFAST, Disp: 90 tablet, Rfl: 0   montelukast  (SINGULAIR ) 10 MG tablet, TAKE (1) TABLET BY MOUTH AT BEDTIME., Disp: 30 tablet, Rfl: 0   Na Sulfate-K Sulfate-Mg Sulfate concentrate (SUPREP) 17.5-3.13-1.6 GM/177ML SOLN, As directed, Disp: 354 mL, Rfl: 0   PARoxetine  (PAXIL ) 40 MG tablet, TAKE (1) TABLET BY MOUTH EACH MORNING., Disp: 30 tablet, Rfl: 0   polyethylene glycol powder (GLYCOLAX /MIRALAX ) 17 GM/SCOOP powder, Take 17 g by mouth 2 (two) times daily as needed., Disp: 3350 g, Rfl: 1   rosuvastatin  (CRESTOR ) 20 MG tablet, Take 1 tablet (20 mg total) by mouth daily. (stop lovastatin ), Disp: 90 tablet, Rfl: 0   triamterene -hydrochlorothiazide (MAXZIDE-25) 37.5-25 MG tablet, TAKE ONE TABLET BY MOUTH EVERY DAY FOR BLOOD PRESSURE, Disp: 90 tablet, Rfl: 0   UNABLE TO FIND, Med Name: Blood  pressure monitoring kit with blood pressure cuff. DX I10, Disp: 1 each, Rfl: 0   Vitamin D , Ergocalciferol , (DRISDOL ) 1.25 MG (50000 UNIT) CAPS capsule, Take 1 capsule (50,000 Units total) by mouth every 7 (seven) days., Disp: 5 capsule, Rfl: 7  Past Medical History:  Diagnosis Date   Allergy    Anxiety    Arthritis    Asthma  Hyperlipidemia    Hypertension    Vision abnormalities    states close to blindness due to cataracts   Past Surgical History:  Procedure Laterality Date   caesarean     cataracts     CESAREAN SECTION     COLONOSCOPY N/A 10/26/2024   Procedure: COLONOSCOPY;  Surgeon: Cindie Carlin POUR, DO;  [facility];  Service: Endoscopy;  Laterality: N/A;  10:45 am, asa 2   COLONOSCOPY WITH PROPOFOL  N/A 09/11/2021   Procedure: COLONOSCOPY WITH PROPOFOL ;  Surgeon: Cindie Carlin POUR, DO;  [facility];  Service: Endoscopy;  Laterality: N/A;  10:30 / ASA II   POLYPECTOMY  09/11/2021   Procedure: POLYPECTOMY;  Surgeon: Cindie Carlin POUR, DO;  [facility];  Service: Endoscopy;;   TOTAL ABDOMINAL HYSTERECTOMY     prolapse   Review of Systems  Constitutional:  + Steady weight since 2021    - fatigue, no subjective hyperthermia, no subjective hypothermia Eyes: no blurry vision, no xerophthalmia ENT: + Severe visual impairment due to corneal defect, no sore throat, no nodules palpated in throat, no dysphagia/odynophagia, no hoarseness   Objective:    BP (!) 142/80   Pulse 72   Ht 5' 7 (1.702 m)   Wt 199 lb 3.2 oz (90.4 kg)   BMI 31.20 kg/m   Wt Readings from Last 3 Encounters:  11/09/24 199 lb 3.2 oz (90.4 kg)  10/26/24 202 lb (91.6 kg)  08/02/24 199 lb (90.3 kg)    Physical Exam  Constitutional:  + Obese, not in acute distress, normal state of mind.  uses her walking cane due to visual impairment.   CMP ( most recent) CMP     Component Value Date/Time   NA 144 04/21/2024 1130   K 4.1 04/21/2024 1130   CL 102 04/21/2024  1130   CO2 22 04/21/2024 1130   GLUCOSE 95 04/21/2024 1130   GLUCOSE 97 09/07/2021 0918   BUN 21 04/21/2024 1130   CREATININE 0.76 04/21/2024 1130   CREATININE 0.96 (H) 06/19/2020 1218   CALCIUM  9.4 04/21/2024 1130   PROT 6.9 04/21/2024 1130   ALBUMIN 4.1 04/21/2024 1130   AST 21 04/21/2024 1130   ALT 21 04/21/2024 1130   ALKPHOS 53 04/21/2024 1130   BILITOT 0.2 04/21/2024 1130   GFRNONAA >60 09/07/2021 0918   GFRNONAA 60 06/19/2020 1218   GFRAA 69 06/19/2020 1218     Diabetic Labs (most recent): Lab Results  Component Value Date   HGBA1C 6.3 05/05/2024   HGBA1C 5.8 04/18/2023   HGBA1C 5.8 04/17/2022     Lipid Panel ( most recent) Lipid Panel     Component Value Date/Time   CHOL 146 11/08/2024 0817   TRIG 73 11/08/2024 0817   HDL 73 11/08/2024 0817   CHOLHDL 2.0 11/08/2024 0817   CHOLHDL 3.3 06/19/2020 1218   VLDL 13 04/21/2017 0846   LDLCALC 59 11/08/2024 0817   LDLCALC 110 (H) 06/19/2020 1218     Last ultrasound of the thyroid  from 05/02/2017 showed right lobe 4.2 cm previously 4.2 cm, left lobe 3.2 cm previously 4.3 cm. There is a 1 cm nodule on the lower part of the left lobe of the thyroid . This nodule was reported to be stable from prior studies.  Recent Results (from the past 2160 hours)  Surgical pathology     Status: None   Collection Time: 10/26/24 10:24 AM  Result Value Ref Range   SURGICAL PATHOLOGY  SURGICAL PATHOLOGY CASE: [id number] PATIENT: Oneta Mifsud Surgical Pathology Report     Clinical History: H/o polyps (nt)     FINAL MICROSCOPIC DIAGNOSIS:  A. COLON, CECAL, ASCENDING, POLYPECTOMY: Tubular adenoma (multiple fragments) without high grade dysplasia.  B. COLON, TRANSVERSE, POLYPECTOMY: Tubular adenoma (multiple fragments) without high grade dysplasia.  GROSS DESCRIPTION: A.  Received in formalin are tan, soft tissue fragments that are submitted in toto. Number: 7 size: 0.1 to 0.3 cm blocks: 1  B.  Received in  formalin are tan, soft tissue fragments that are submitted in toto. Number: 8 size: 0.1 to 0.7 cm blocks: 1 (KW, 10/26/2024)   Final Diagnosis performed by Norleen Dover, MD.   Electronically signed 10/27/2024 Technical component performed at [facility], [address], [place], [place] [address].  Professional component performed at [employer], [address] [place], [place] [address].   Immunohistochemistry Technical component (if applicable) was performed at [facility]. [address], [place], [place] [address].   IMMUNOHISTOCHEMISTRY DISCLAIMER (if applicable): Some of these immunohistochemical stains may have been developed and the performance characteristics determine by [facility]. Some may not have been cleared or approved by the U.S. Food and Drug Administration. The FDA has determined that such clearance or approval is not necessary. This test is used for clinical purposes. It should not be regarded as investigational or for research. This laboratory is certified under the Clinical Laboratory Improvement Amendments of 1988 (CLIA-88) as qualified to perform high complexity clinical laboratory testing.  The controls stained appropriately.   IHC stains are performed on formalin fixed, paraffin embedded tissue using a 3,3diaminobenzidine (DAB) chromogen and Leica Bond Autostainer System. The staining in tensity of the nucleus is score manually and is reported as the percentage of tumor cell nuclei demonstrating specific nuclear staining. The specimens are fixed in 10% Neutral Formalin for at least 6 hours and up to 72hrs. These tests are validated on decalcified tissue. Results should be interpreted with caution given the possibility of false negative results on decalcified specimens. Antibody Clones are as follows ER-clone 65F, PR-clone 16, Ki67- clone MM1. Some of these immunohistochemical  stains may have been developed and the performance characteristics determined by [facility].   Lipid panel     Status: None   Collection Time: 11/08/24  8:17 AM  Result Value Ref Range   Cholesterol, Total 146 100 - 199 mg/dL   Triglycerides 73 0 - 149 mg/dL   HDL 73 >60 mg/dL   VLDL Cholesterol Cal 14 5 - 40 mg/dL   LDL Chol Calc (NIH) 59 0 - 99 mg/dL   Chol/HDL Ratio 2.0 0.0 - 4.4 ratio    Comment:                                   T. Chol/HDL Ratio                                             Men  Women                               1/2 Avg.Risk  3.4    3.3  Avg.Risk  5.0    4.4                                2X Avg.Risk  9.6    7.1                                3X Avg.Risk 23.4   11.0   TSH     Status: None   Collection Time: 11/08/24  8:17 AM  Result Value Ref Range   TSH 3.380 0.450 - 4.500 uIU/mL  T4, free     Status: None   Collection Time: 11/08/24  8:17 AM  Result Value Ref Range   Free T4 1.24 0.82 - 1.77 ng/dL    Thyroid  ultrasound on October 11, 2020 No abnormal lymph nodes identified.   IMPRESSION: Stable thyroid  ultrasound. Stable 1.0 cm left inferior thyroid  nodule which does not meet criteria for biopsy or dedicated follow-up. This nodule appears stable since 2015 and is therefore likely benign.   Assessment & Plan:   1. Hypothyroidism due to Hashimoto's thyroiditis  -Her previsit thyroid  function tests are consistent with appropriate replacement.  She is advised to continue levothyroxine  112 mcg p.o. daily before breakfast.    - We discussed about the correct intake of her thyroid  hormone, on empty stomach at fasting, with water , separated by at least 30 minutes from breakfast and other medications,  and separated by more than 4 hours from calcium , iron, multivitamins, acid reflux medications (PPIs). -Patient is made aware of the fact that thyroid  hormone replacement is needed for life, dose to be adjusted by  periodic monitoring of thyroid  function tests.  2. Nodular goiter-  - In the EMR records, she has a series of thyroid /neck sonograms,  showing stable solitary left lobe nodule at 1 cm in maximum dimension with no suspicious features.  Her most recent surveillance thyroid  ultrasound is showing the same findings with no change.  She has a documented stability with benign features since 2015.  She will not require intervention at this time.    3.  Prediabetes -Her point-of-care A1c is 6.4% progressively increasing from 5.8%.  She was offered low-dose metformin to slow down the progression to type 2 diabetes, however patient declines and agrees to change her lifestyle-as detailed below.    - she acknowledges that there is a room for improvement in her food and drink choices. - Suggestion is made for her to avoid simple carbohydrates  from her diet including Cakes, Sweet Desserts, Ice Cream, Soda (diet and regular), Sweet Tea, Candies, Chips, Cookies, Store Bought Juices, Alcohol in Excess of  1-2 drinks a day, Artificial Sweeteners,  Coffee Creamer, and Sugar-free Products, Lemonade. This will help patient to have more stable blood glucose profile and potentially avoid unintended weight gain.     4.  dyslipidemia: She presents with lipid panel indicates significantly improved profile with LDL at 59 improving from 123.  She is advised to continue Crestor  20 mg p.o. send precautions discussed with her.      She is advised to introduce more whole food plant-based diet.  - I advised patient to maintain close follow up with Antonetta Rollene BRAVO, MD for primary care needs.   I spent  26  minutes in the care of the patient today including review of labs from Thyroid  Function, CMP, and other relevant labs ;  imaging/biopsy records (current and previous including abstractions from other facilities); face-to-face time discussing  her lab results and symptoms, medications doses, her options of short and  long term treatment based on the latest standards of care / guidelines;   and documenting the encounter.  Kasiya V Cohick  participated in the discussions, expressed understanding, and voiced agreement with the above plans.  All questions were answered to her satisfaction. she is encouraged to contact clinic should she have any questions or concerns prior to her return visit.   Follow up plan: Return in about 6 months (around 05/10/2025) for F/U with Pre-visit Labs, A1c -NV.  Ranny Earl, MD Phone: [phone]  Fax: [phone]  -  This note was partially dictated with voice recognition software. Similar sounding words can be transcribed inadequately or may not  be corrected upon review.  11/09/2024, 9:49 AM

## 2024-11-30 ENCOUNTER — Other Ambulatory Visit: Payer: Self-pay | Admitting: Family Medicine

## 2024-11-30 ENCOUNTER — Ambulatory Visit: Payer: Self-pay | Admitting: Internal Medicine

## 2024-11-30 DIAGNOSIS — J309 Allergic rhinitis, unspecified: Secondary | ICD-10-CM

## 2024-12-06 NOTE — Progress Notes (Signed)
 NIC'd the colonoscopy for 3 yrs

## 2024-12-22 LAB — LIPID PANEL W/O CHOL/HDL RATIO
Cholesterol, Total: 149 mg/dL (ref 100–199)
HDL: 65 mg/dL
LDL Chol Calc (NIH): 67 mg/dL (ref 0–99)
Triglycerides: 95 mg/dL (ref 0–149)
VLDL Cholesterol Cal: 17 mg/dL (ref 5–40)

## 2024-12-22 LAB — COMPREHENSIVE METABOLIC PANEL WITH GFR
ALT: 18 IU/L (ref 0–32)
AST: 21 IU/L (ref 0–40)
Albumin: 4.3 g/dL (ref 3.8–4.8)
Alkaline Phosphatase: 54 IU/L (ref 49–135)
BUN/Creatinine Ratio: 25 (ref 12–28)
BUN: 21 mg/dL (ref 8–27)
Bilirubin Total: 0.3 mg/dL (ref 0.0–1.2)
CO2: 23 mmol/L (ref 20–29)
Calcium: 9.4 mg/dL (ref 8.7–10.3)
Chloride: 102 mmol/L (ref 96–106)
Creatinine, Ser: 0.85 mg/dL (ref 0.57–1.00)
Globulin, Total: 3.2 g/dL (ref 1.5–4.5)
Glucose: 88 mg/dL (ref 70–99)
Potassium: 4.3 mmol/L (ref 3.5–5.2)
Sodium: 142 mmol/L (ref 134–144)
Total Protein: 7.5 g/dL (ref 6.0–8.5)
eGFR: 72 mL/min/1.73

## 2024-12-30 ENCOUNTER — Other Ambulatory Visit: Payer: Self-pay | Admitting: Family Medicine

## 2024-12-31 ENCOUNTER — Encounter: Payer: Self-pay | Admitting: Family Medicine

## 2024-12-31 ENCOUNTER — Ambulatory Visit: Admitting: Family Medicine

## 2024-12-31 VITALS — BP 120/72 | HR 78 | Ht 67.0 in | Wt 196.0 lb

## 2024-12-31 DIAGNOSIS — I10 Essential (primary) hypertension: Secondary | ICD-10-CM | POA: Diagnosis not present

## 2024-12-31 DIAGNOSIS — J45991 Cough variant asthma: Secondary | ICD-10-CM

## 2024-12-31 DIAGNOSIS — R7303 Prediabetes: Secondary | ICD-10-CM

## 2024-12-31 DIAGNOSIS — F324 Major depressive disorder, single episode, in partial remission: Secondary | ICD-10-CM | POA: Diagnosis not present

## 2024-12-31 DIAGNOSIS — M5410 Radiculopathy, site unspecified: Secondary | ICD-10-CM | POA: Diagnosis not present

## 2024-12-31 DIAGNOSIS — F419 Anxiety disorder, unspecified: Secondary | ICD-10-CM | POA: Diagnosis not present

## 2024-12-31 DIAGNOSIS — E038 Other specified hypothyroidism: Secondary | ICD-10-CM

## 2024-12-31 DIAGNOSIS — Z1231 Encounter for screening mammogram for malignant neoplasm of breast: Secondary | ICD-10-CM

## 2024-12-31 DIAGNOSIS — E559 Vitamin D deficiency, unspecified: Secondary | ICD-10-CM

## 2024-12-31 DIAGNOSIS — E782 Mixed hyperlipidemia: Secondary | ICD-10-CM | POA: Diagnosis not present

## 2024-12-31 DIAGNOSIS — E663 Overweight: Secondary | ICD-10-CM | POA: Diagnosis not present

## 2024-12-31 MED ORDER — KETOROLAC TROMETHAMINE 60 MG/2ML IM SOLN
60.0000 mg | Freq: Once | INTRAMUSCULAR | Status: AC
  Administered 2024-12-31: 60 mg via INTRAMUSCULAR

## 2024-12-31 NOTE — Progress Notes (Signed)
 "  [name]     MRN: [id number]      DOB: [birth year]  Chief Complaint  Patient presents with   Follow-up    HPI [name] is here for follow up and re-evaluation of chronic medical conditions, medication management and review of any available recent lab and radiology data.  Preventive health is updated, specifically  Cancer screening and Immunization.   Questions or concerns regarding consultations or procedures which the PT has had in the interim are  addressed. The PT denies any adverse reactions to current medications since the last visit.  C/o increased right groin and thigh pain x 2 weeks Concerned about needing to change diet as endo has advised she is  pre diabetic and should start metformin  ROS Denies recent fever or chills. Denies sinus pressure, nasal congestion, ear pain or sore throat. Denies chest congestion, productive cough or wheezing. Denies chest pains, palpitations and leg swelling Denies abdominal pain, nausea, vomiting,diarrhea or constipation.   Denies dysuria, frequency, hesitancy or incontinence. . Denies depression, uncontrolled anxiety or insomnia. Denies skin break down or rash.   PE  BP 120/72   Pulse 78   Ht 5' 7 (1.702 m)   Wt 196 lb (88.9 kg)   SpO2 93%   BMI 30.70 kg/m    Patient alert and oriented and in no cardiopulmonary distress.  HEENT: No facial asymmetry, EOMI,     Neck supple .  Chest: Clear to auscultation bilaterally.  CVS: S1, S2 no murmurs, no S3.Regular rate.  ABD: Soft non tender.   Ext: No edema  MS: Decreased  ROM spine, shoulders, hips and knees.  Skin: Intact, no ulcerations or rash noted.  Psych: Good eye contact, normal affect. Memory intact not anxious or depressed appearing.  CNS: CN 2-12 intact, power,  normal throughout.no focal deficits noted.   Assessment & Plan  Prediabetes Patient educated about the importance of limiting  Carbohydrate intake , the need to commit to daily physical  activity for a minimum of 30 minutes , and to commit weight loss. The fact that changes in all these areas will reduce or eliminate all together the development of diabetes is stressed.      Latest Ref Rng & Units 12/21/2024    8:52 AM 12/21/2024    8:47 AM 11/08/2024    8:17 AM 05/05/2024   10:20 AM 04/21/2024   11:30 AM  Diabetic Labs  HbA1c 0.0 - 7.0 %    6.3    Chol 100 - 199 mg/dL  850  853   763   HDL >60 mg/dL  65  73   94   Calc LDL 0 - 99 mg/dL  67  59   876   Triglycerides 0 - 149 mg/dL  95  73   890   Creatinine 0.57 - 1.00 mg/dL 9.14     9.23       8/76/7973   10:14 AM 12/31/2024    9:47 AM 12/31/2024    9:32 AM 12/31/2024    9:30 AM 11/09/2024    8:47 AM 10/26/2024   10:52 AM 10/26/2024    8:59 AM  BP/Weight  Systolic BP 120 120 144 146 142 128 151  Diastolic BP 72 72 80 75 80 67 72  Wt. (Lbs)    196 199.2  202  BMI    30.7 kg/m2 31.2 kg/m2  31.64 kg/m2       No data to display  Updated lab needed at/ before next visit.   Vitamin D  deficiency Updated lab needed at/ before next visit.   Hypothyroidism Managed by Endo, controlled  Back pain with radiculopathy Uncontrolled.Toradol  60 mg  IM in the office    Anxiety Controlled, no change in medication   Asthma, cough variant Controlled, no change in medication   Depression, major, single episode, in partial remission Controlled, no change in medication   Overweight (BMI 25.0-29.9)  Patient re-educated about  the importance of commitment to a  minimum of 150 minutes of exercise per week as able.  The importance of healthy food choices with portion control discussed, as well as eating regularly and within a 12 hour window most days. The need to choose clean , green food 50 to 75% of the time is discussed, as well as to make water  the primary drink and set a goal of 64 ounces water  daily.       12/31/2024    9:30 AM 11/09/2024    8:47 AM 10/26/2024    8:59 AM  Weight /BMI  Weight 196  lb 199 lb 3.2 oz 202 lb  Height 5' 7 (1.702 m) 5' 7 (1.702 m) 5' 7 (1.702 m)  BMI 30.7 kg/m2 31.2 kg/m2 31.64 kg/m2    Deteriuorated   "

## 2024-12-31 NOTE — Patient Instructions (Addendum)
 Annual exam early June  Toradol  60 mg iM in office   AVOID sugar except fruit, fresh or frozen please  Please schedule mamogram at checkout  CBC, HBA1c, bmp and EGFr and vit D last week in May  Covid vaccine is recommended and this is at your pharmacy  Be  careful not to fall Sticker for car provided today  Thanks for choosing [facility], we consider it a privelige to serve you.

## 2025-01-01 MED ORDER — GABAPENTIN 100 MG PO CAPS: 100.0000 mg | ORAL_CAPSULE | Freq: Every day | ORAL | 1 refills | Status: AC

## 2025-01-01 NOTE — Assessment & Plan Note (Signed)
 Controlled, no change in medication

## 2025-01-01 NOTE — Assessment & Plan Note (Signed)
" °  Patient re-educated about  the importance of commitment to a  minimum of 150 minutes of exercise per week as able.  The importance of healthy food choices with portion control discussed, as well as eating regularly and within a 12 hour window most days. The need to choose clean , green food 50 to 75% of the time is discussed, as well as to make water  the primary drink and set a goal of 64 ounces water  daily.       12/31/2024    9:30 AM 11/09/2024    8:47 AM 10/26/2024    8:59 AM  Weight /BMI  Weight 196 lb 199 lb 3.2 oz 202 lb  Height 5' 7 (1.702 m) 5' 7 (1.702 m) 5' 7 (1.702 m)  BMI 30.7 kg/m2 31.2 kg/m2 31.64 kg/m2    Deteriuorated  "

## 2025-01-01 NOTE — Assessment & Plan Note (Signed)
 Updated lab needed at/ before next visit.

## 2025-01-01 NOTE — Assessment & Plan Note (Signed)
 Uncontrolled.Toradol  60 mg  IM in the office

## 2025-01-01 NOTE — Assessment & Plan Note (Signed)
 Patient educated about the importance of limiting  Carbohydrate intake , the need to commit to daily physical activity for a minimum of 30 minutes , and to commit weight loss. The fact that changes in all these areas will reduce or eliminate all together the development of diabetes is stressed.      Latest Ref Rng & Units 12/21/2024    8:52 AM 12/21/2024    8:47 AM 11/08/2024    8:17 AM 05/05/2024   10:20 AM 04/21/2024   11:30 AM  Diabetic Labs  HbA1c 0.0 - 7.0 %    6.3    Chol 100 - 199 mg/dL  850  853   763   HDL >60 mg/dL  65  73   94   Calc LDL 0 - 99 mg/dL  67  59   876   Triglycerides 0 - 149 mg/dL  95  73   890   Creatinine 0.57 - 1.00 mg/dL 9.14     9.23       8/76/7973   10:14 AM 12/31/2024    9:47 AM 12/31/2024    9:32 AM 12/31/2024    9:30 AM 11/09/2024    8:47 AM 10/26/2024   10:52 AM 10/26/2024    8:59 AM  BP/Weight  Systolic BP 120 120 144 146 142 128 151  Diastolic BP 72 72 80 75 80 67 72  Wt. (Lbs)    196 199.2  202  BMI    30.7 kg/m2 31.2 kg/m2  31.64 kg/m2       No data to display          Updated lab needed at/ before next visit.

## 2025-01-01 NOTE — Assessment & Plan Note (Signed)
Managed by Endo, controlled  

## 2025-05-11 ENCOUNTER — Ambulatory Visit: Admitting: "Endocrinology

## 2025-05-20 ENCOUNTER — Encounter: Payer: Self-pay | Admitting: Family Medicine

## 2025-07-27 ENCOUNTER — Ambulatory Visit (HOSPITAL_COMMUNITY)

## 2025-08-03 ENCOUNTER — Ambulatory Visit

## 2025-08-05 ENCOUNTER — Ambulatory Visit
